# Patient Record
Sex: Male | Born: 1960
Health system: Southern US, Community
[De-identification: ages and names within clinical notes are randomized; demographics above are authoritative.]

## PROBLEM LIST (undated history)

## (undated) DIAGNOSIS — K449 Diaphragmatic hernia without obstruction or gangrene: Secondary | ICD-10-CM

## (undated) DIAGNOSIS — C419 Malignant neoplasm of bone and articular cartilage, unspecified: Secondary | ICD-10-CM

## (undated) DIAGNOSIS — K2970 Gastritis, unspecified, without bleeding: Secondary | ICD-10-CM

## (undated) DIAGNOSIS — R6889 Other general symptoms and signs: Secondary | ICD-10-CM

## (undated) DIAGNOSIS — C499 Malignant neoplasm of connective and soft tissue, unspecified: Secondary | ICD-10-CM

## (undated) DIAGNOSIS — F1210 Cannabis abuse, uncomplicated: Secondary | ICD-10-CM

## (undated) DIAGNOSIS — E785 Hyperlipidemia, unspecified: Secondary | ICD-10-CM

## (undated) DIAGNOSIS — R110 Nausea: Secondary | ICD-10-CM

## (undated) DIAGNOSIS — R1110 Vomiting, unspecified: Secondary | ICD-10-CM

## (undated) HISTORY — DX: Malignant neoplasm of connective and soft tissue, unspecified: C49.9

## (undated) HISTORY — DX: Nausea: R11.0

## (undated) HISTORY — PX: APPENDECTOMY: SHX54

## (undated) HISTORY — DX: Diaphragmatic hernia without obstruction or gangrene: K44.9

## (undated) HISTORY — PX: AMPUTATION: SHX166

## (undated) HISTORY — DX: Hyperlipidemia, unspecified: E78.5

## (undated) HISTORY — DX: Gastritis, unspecified, without bleeding: K29.70

## (undated) HISTORY — DX: Other general symptoms and signs: R68.89

## (undated) HISTORY — DX: Vomiting, unspecified: R11.10

## (undated) HISTORY — DX: Malignant neoplasm of bone and articular cartilage, unspecified: C41.9

## (undated) HISTORY — DX: Cannabis abuse, uncomplicated: F12.10

## (undated) HISTORY — PX: CERVICAL LAMINECTOMY: SHX94

---

## 1996-07-14 DIAGNOSIS — D229 Melanocytic nevi, unspecified: Secondary | ICD-10-CM

## 1996-07-14 HISTORY — DX: Melanocytic nevi, unspecified: D22.9

## 2000-06-13 ENCOUNTER — Emergency Department (HOSPITAL_COMMUNITY): Admission: EM | Admit: 2000-06-13 | Discharge: 2000-06-13 | Payer: Self-pay | Admitting: *Deleted

## 2001-04-20 ENCOUNTER — Encounter: Payer: Self-pay | Admitting: Emergency Medicine

## 2001-04-21 ENCOUNTER — Inpatient Hospital Stay (HOSPITAL_COMMUNITY): Admission: EM | Admit: 2001-04-21 | Discharge: 2001-04-21 | Payer: Self-pay | Admitting: Emergency Medicine

## 2002-05-22 ENCOUNTER — Encounter: Payer: Self-pay | Admitting: Emergency Medicine

## 2002-05-22 ENCOUNTER — Emergency Department (HOSPITAL_COMMUNITY): Admission: EM | Admit: 2002-05-22 | Discharge: 2002-05-22 | Payer: Self-pay | Admitting: Emergency Medicine

## 2002-08-08 ENCOUNTER — Emergency Department (HOSPITAL_COMMUNITY): Admission: EM | Admit: 2002-08-08 | Discharge: 2002-08-08 | Payer: Self-pay | Admitting: *Deleted

## 2002-08-08 ENCOUNTER — Encounter: Payer: Self-pay | Admitting: *Deleted

## 2003-04-15 ENCOUNTER — Inpatient Hospital Stay (HOSPITAL_COMMUNITY): Admission: EM | Admit: 2003-04-15 | Discharge: 2003-04-17 | Payer: Self-pay | Admitting: Emergency Medicine

## 2003-06-08 ENCOUNTER — Encounter: Admission: RE | Admit: 2003-06-08 | Discharge: 2003-06-08 | Payer: Self-pay | Admitting: Gastroenterology

## 2003-08-03 ENCOUNTER — Emergency Department (HOSPITAL_COMMUNITY): Admission: EM | Admit: 2003-08-03 | Discharge: 2003-08-03 | Payer: Self-pay | Admitting: Emergency Medicine

## 2003-08-09 ENCOUNTER — Emergency Department (HOSPITAL_COMMUNITY): Admission: EM | Admit: 2003-08-09 | Discharge: 2003-08-09 | Payer: Self-pay | Admitting: Emergency Medicine

## 2003-08-11 ENCOUNTER — Observation Stay (HOSPITAL_COMMUNITY): Admission: EM | Admit: 2003-08-11 | Discharge: 2003-08-13 | Payer: Self-pay | Admitting: Emergency Medicine

## 2003-08-19 ENCOUNTER — Inpatient Hospital Stay (HOSPITAL_COMMUNITY): Admission: EM | Admit: 2003-08-19 | Discharge: 2003-08-21 | Payer: Self-pay | Admitting: Emergency Medicine

## 2004-06-20 ENCOUNTER — Emergency Department (HOSPITAL_COMMUNITY): Admission: EM | Admit: 2004-06-20 | Discharge: 2004-06-20 | Payer: Self-pay | Admitting: Emergency Medicine

## 2004-06-21 ENCOUNTER — Emergency Department (HOSPITAL_COMMUNITY): Admission: EM | Admit: 2004-06-21 | Discharge: 2004-06-21 | Payer: Self-pay | Admitting: Emergency Medicine

## 2004-06-26 ENCOUNTER — Encounter: Admission: RE | Admit: 2004-06-26 | Discharge: 2004-06-26 | Payer: Self-pay | Admitting: Gastroenterology

## 2004-07-11 ENCOUNTER — Encounter: Admission: RE | Admit: 2004-07-11 | Discharge: 2004-07-11 | Payer: Self-pay | Admitting: Gastroenterology

## 2004-07-24 ENCOUNTER — Ambulatory Visit (HOSPITAL_COMMUNITY): Admission: RE | Admit: 2004-07-24 | Discharge: 2004-07-24 | Payer: Self-pay | Admitting: Gastroenterology

## 2004-10-15 ENCOUNTER — Emergency Department (HOSPITAL_COMMUNITY): Admission: EM | Admit: 2004-10-15 | Discharge: 2004-10-16 | Payer: Self-pay | Admitting: Emergency Medicine

## 2005-04-13 ENCOUNTER — Emergency Department (HOSPITAL_COMMUNITY): Admission: EM | Admit: 2005-04-13 | Discharge: 2005-04-14 | Payer: Self-pay | Admitting: Emergency Medicine

## 2005-08-29 ENCOUNTER — Inpatient Hospital Stay (HOSPITAL_COMMUNITY): Admission: EM | Admit: 2005-08-29 | Discharge: 2005-08-30 | Payer: Self-pay | Admitting: Emergency Medicine

## 2006-08-08 ENCOUNTER — Emergency Department (HOSPITAL_COMMUNITY): Admission: EM | Admit: 2006-08-08 | Discharge: 2006-08-08 | Payer: Self-pay | Admitting: Emergency Medicine

## 2007-12-27 ENCOUNTER — Encounter: Admission: RE | Admit: 2007-12-27 | Discharge: 2007-12-27 | Payer: Self-pay | Admitting: Neurosurgery

## 2008-06-06 ENCOUNTER — Emergency Department (HOSPITAL_COMMUNITY): Admission: EM | Admit: 2008-06-06 | Discharge: 2008-06-06 | Payer: Self-pay | Admitting: Emergency Medicine

## 2008-07-19 DIAGNOSIS — K2970 Gastritis, unspecified, without bleeding: Secondary | ICD-10-CM

## 2008-07-19 HISTORY — DX: Gastritis, unspecified, without bleeding: K29.70

## 2008-08-01 ENCOUNTER — Emergency Department (HOSPITAL_COMMUNITY): Admission: EM | Admit: 2008-08-01 | Discharge: 2008-08-01 | Payer: Self-pay | Admitting: Family Medicine

## 2008-08-20 ENCOUNTER — Emergency Department (HOSPITAL_COMMUNITY): Admission: EM | Admit: 2008-08-20 | Discharge: 2008-08-20 | Payer: Self-pay | Admitting: Emergency Medicine

## 2008-11-03 ENCOUNTER — Emergency Department (HOSPITAL_COMMUNITY): Admission: EM | Admit: 2008-11-03 | Discharge: 2008-11-03 | Payer: Self-pay | Admitting: Emergency Medicine

## 2008-11-05 ENCOUNTER — Observation Stay (HOSPITAL_COMMUNITY): Admission: EM | Admit: 2008-11-05 | Discharge: 2008-11-06 | Payer: Self-pay | Admitting: Emergency Medicine

## 2009-12-10 ENCOUNTER — Ambulatory Visit: Payer: Self-pay | Admitting: Cardiology

## 2010-07-17 ENCOUNTER — Other Ambulatory Visit: Payer: Self-pay | Admitting: *Deleted

## 2010-07-17 DIAGNOSIS — F419 Anxiety disorder, unspecified: Secondary | ICD-10-CM

## 2010-07-17 MED ORDER — DIAZEPAM 5 MG PO TABS: 5.0000 mg | ORAL_TABLET | Freq: Three times a day (TID) | ORAL | Status: AC

## 2010-07-17 NOTE — Telephone Encounter (Signed)
Refilled meds per fax request.  

## 2010-07-27 LAB — URINALYSIS, ROUTINE W REFLEX MICROSCOPIC
Bilirubin Urine: NEGATIVE
Nitrite: NEGATIVE
Specific Gravity, Urine: 1.038 — ABNORMAL HIGH (ref 1.005–1.030)
Urobilinogen, UA: 0.2 mg/dL (ref 0.0–1.0)
pH: 6 (ref 5.0–8.0)

## 2010-07-27 LAB — POCT I-STAT, CHEM 8
Calcium, Ion: 1.15 mmol/L (ref 1.12–1.32)
Creatinine, Ser: 1 mg/dL (ref 0.4–1.5)
Glucose, Bld: 133 mg/dL — ABNORMAL HIGH (ref 70–99)
Hemoglobin: 14.6 g/dL (ref 13.0–17.0)
Sodium: 144 mEq/L (ref 135–145)
TCO2: 22 mmol/L (ref 0–100)

## 2010-07-27 LAB — DIFFERENTIAL
Basophils Relative: 1 % (ref 0–1)
Eosinophils Absolute: 0 10*3/uL (ref 0.0–0.7)
Eosinophils Absolute: 0 10*3/uL (ref 0.0–0.7)
Eosinophils Relative: 0 % (ref 0–5)
Eosinophils Relative: 1 % (ref 0–5)
Lymphs Abs: 0.9 10*3/uL (ref 0.7–4.0)
Monocytes Absolute: 0.4 10*3/uL (ref 0.1–1.0)
Monocytes Relative: 4 % (ref 3–12)
Monocytes Relative: 5 % (ref 3–12)
Neutrophils Relative %: 83 % — ABNORMAL HIGH (ref 43–77)

## 2010-07-27 LAB — COMPREHENSIVE METABOLIC PANEL
ALT: 15 U/L (ref 0–53)
CO2: 25 mEq/L (ref 19–32)
Calcium: 9.4 mg/dL (ref 8.4–10.5)
Creatinine, Ser: 1.08 mg/dL (ref 0.4–1.5)
GFR calc Af Amer: >60 mL/min (ref >60)
GFR calc non Af Amer: >60 mL/min (ref >60)
Glucose, Bld: 185 mg/dL — ABNORMAL HIGH (ref 70–99)
Sodium: 146 mEq/L — ABNORMAL HIGH (ref 135–145)
Total Protein: 7.1 g/dL (ref 6.0–8.3)

## 2010-07-27 LAB — BASIC METABOLIC PANEL
CO2: 30 mEq/L (ref 19–32)
Chloride: 107 mEq/L (ref 96–112)
Glucose, Bld: 102 mg/dL — ABNORMAL HIGH (ref 70–99)
Potassium: 3.9 mEq/L (ref 3.5–5.1)
Sodium: 143 mEq/L (ref 135–145)

## 2010-07-27 LAB — CBC
HCT: 38.9 % — ABNORMAL LOW (ref 39.0–52.0)
HCT: 45.9 % (ref 39.0–52.0)
Hemoglobin: 13.2 g/dL (ref 13.0–17.0)
Hemoglobin: 14.5 g/dL (ref 13.0–17.0)
MCHC: 33.7 g/dL (ref 30.0–36.0)
MCHC: 33.9 g/dL (ref 30.0–36.0)
MCHC: 34.8 g/dL (ref 30.0–36.0)
MCV: 89.4 fL (ref 78.0–100.0)
Platelets: 225 10*3/uL (ref 150–400)
RBC: 4.28 MIL/uL (ref 4.22–5.81)
RBC: 4.7 MIL/uL (ref 4.22–5.81)
RDW: 13.3 % (ref 11.5–15.5)
WBC: 6.6 10*3/uL (ref 4.0–10.5)

## 2010-07-27 LAB — LIPASE, BLOOD: Lipase: 25 U/L (ref 11–59)

## 2010-07-29 LAB — URINALYSIS, ROUTINE W REFLEX MICROSCOPIC
Bilirubin Urine: NEGATIVE
Nitrite: NEGATIVE
Protein, ur: NEGATIVE mg/dL
Urobilinogen, UA: 1 mg/dL (ref 0.0–1.0)
pH: 5.5 (ref 5.0–8.0)

## 2010-07-29 LAB — BASIC METABOLIC PANEL
BUN: 28 mg/dL — ABNORMAL HIGH (ref 6–23)
CO2: 19 mEq/L (ref 19–32)
Calcium: 10.4 mg/dL (ref 8.4–10.5)
GFR calc non Af Amer: >60 mL/min (ref >60)
Glucose, Bld: 162 mg/dL — ABNORMAL HIGH (ref 70–99)
Potassium: 3.6 mEq/L (ref 3.5–5.1)

## 2010-07-29 LAB — RAPID URINE DRUG SCREEN, HOSP PERFORMED
Cocaine: NOT DETECTED
Tetrahydrocannabinol: POSITIVE — AB

## 2010-07-29 LAB — DIFFERENTIAL
Basophils Absolute: 0 10*3/uL (ref 0.0–0.1)
Basophils Relative: 0 % (ref 0–1)
Eosinophils Relative: 1 % (ref 0–5)
Lymphocytes Relative: 16 % (ref 12–46)
Monocytes Absolute: 0.5 10*3/uL (ref 0.1–1.0)

## 2010-07-29 LAB — CBC
HCT: 50.1 % (ref 39.0–52.0)
MCHC: 34.1 g/dL (ref 30.0–36.0)
Platelets: 242 10*3/uL (ref 150–400)
RDW: 12.8 % (ref 11.5–15.5)

## 2010-08-05 LAB — DIFFERENTIAL
Basophils Absolute: 0 10*3/uL (ref 0.0–0.1)
Basophils Relative: 0 % (ref 0–1)
Eosinophils Relative: 0 % (ref 0–5)
Lymphocytes Relative: 6 % — ABNORMAL LOW (ref 12–46)
Monocytes Absolute: 1.3 10*3/uL — ABNORMAL HIGH (ref 0.1–1.0)
Neutro Abs: 14.2 10*3/uL — ABNORMAL HIGH (ref 1.7–7.7)

## 2010-08-05 LAB — CBC
HCT: 48.3 % (ref 39.0–52.0)
Hemoglobin: 16.4 g/dL (ref 13.0–17.0)
MCV: 91.2 fL (ref 78.0–100.0)
RBC: 5.3 MIL/uL (ref 4.22–5.81)
WBC: 16.5 10*3/uL — ABNORMAL HIGH (ref 4.0–10.5)

## 2010-08-05 LAB — COMPREHENSIVE METABOLIC PANEL
Albumin: 5.2 g/dL (ref 3.5–5.2)
Alkaline Phosphatase: 77 U/L (ref 39–117)
BUN: 36 mg/dL — ABNORMAL HIGH (ref 6–23)
CO2: 21 mEq/L (ref 19–32)
Chloride: 104 mEq/L (ref 96–112)
Creatinine, Ser: 1.72 mg/dL — ABNORMAL HIGH (ref 0.4–1.5)
GFR calc non Af Amer: 43 mL/min — ABNORMAL LOW (ref >60)
Glucose, Bld: 133 mg/dL — ABNORMAL HIGH (ref 70–99)
Potassium: 4 mEq/L (ref 3.5–5.1)
Total Bilirubin: 1.4 mg/dL — ABNORMAL HIGH (ref 0.3–1.2)

## 2010-08-05 LAB — LIPASE, BLOOD: Lipase: 16 U/L (ref 11–59)

## 2010-09-02 NOTE — H&P (Signed)
Benjamin Valdez, Benjamin Valdez          ACCOUNT NO.:  [id number]   MEDICAL RECORD NO.:  [id number]          PATIENT TYPE:  INP   LOCATION:  1521                         FACILITY:  [facility]   PHYSICIAN:  Graylin Shiver, M.D.   DATE OF BIRTH:  [birth year]   DATE OF ADMISSION:  11/05/2008  DATE OF DISCHARGE:                              HISTORY & PHYSICAL   CHIEF COMPLAINT:  Vomiting.   HISTORY OF PRESENT ILLNESS:  The patient is a 50 year old white male who  presented to the emergency room with complaints of persistent vomiting.  This is his second time here in a couple of days to the emergency room.  He has had problems with chronic recurring vomiting for 10-12 years, and  despite numerous and extensive GI evaluation according to him nothing  has been found to explain the recurring vomiting.  He states that he has  had problems with vomiting this year at least seven or eight times.  When he gets these spells, they will last for 8-9 hours.  This time in  the emergency room he has continued to have problems with the vomiting  despite the symptomatic treatment and IV hydration, and it was felt by  the emergency room physician that he should be admitted.   The patient has had negative endoscopies, negative ultrasounds, negative  gastric emptying study in the past.  He is recently getting established  with GI at [facility], and states that he saw a PA with the GI  Department there a few weeks ago and it was felt that he may have  cyclical vomiting syndrome.  He was given a prescription for  amitriptyline, but he does not know what dose it was and he never  started taking it yet.  Of note is that in between these episodes of  vomiting he feels fine.   PAST HISTORY:  1. 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  This was followed by a      recurrence, and he had radiation therapy.  2. History of appendectomy.  His oncologist  at [facility] is Dr.      Edilia Bo.   ALLERGIES:  None known.   SOCIAL HISTORY:  He does not smoke or drink alcohol.   MEDICATIONS:  1. Crestor 10 mg daily.  2. Paxil 40 mg daily.  3. Protonix 40 mg daily.   SYSTEMS REVIEW:  No complaints of chest pain or shortness of breath.   PHYSICAL EXAMINATION:  VITAL SIGNS:  Stable.  He does not appear in any acute distress at this time, although he is  lying in bed and has a cloth over his eyes.  He is nonicteric.  NECK:  Supple.  HEART:  Regular rhythm.  No murmurs.  LUNGS:  Clear.  ABDOMEN:  Soft.  There is some mild diffuse tenderness over the  musculature which I suspect this is from his vomiting so much.   IMPRESSION:  Recurrent vomiting.   PLAN:  The patient is going to be admitted to the  hospital primarily for  intravenous hydration and supportive care.  We will provide antiemetics,  analgesics, and proton pump inhibitors.   His labs were reviewed and there is no significant abnormality in CBC or  electrolytes.  Two days ago when he was here in the emergency room, an  abdominal x-ray showed no evidence of bowel obstruction.           ______________________________  Graylin Shiver, M.D.     SFG/MEDQ  D:  11/05/2008  T:  11/06/2008  Job:  [id number]   cc:   Petra Kuba, M.D.  Fax: [phone]   Cassell Clement, M.D.  Fax: [phone]

## 2010-09-05 NOTE — H&P (Signed)
NAME:  Benjamin Valdez, Benjamin Valdez                      ACCOUNT NO.:  [id number]   MEDICAL RECORD NO.:  [id number]                   PATIENT TYPE:  EMS   LOCATION:  ED                                   FACILITY:  [facility]   PHYSICIAN:  Leonia Reeves, MD                 DATE OF BIRTH:  02/03/1961   DATE OF ADMISSION:  04/15/2003  DATE OF DISCHARGE:                                HISTORY & PHYSICAL   PRIMARY CARE PHYSICIAN:  Cassell Clement, M.D.   ADMITTING DIAGNOSES AND PLAN:  1. Abdominal pain, nausea, and vomiting, unclear etiology probably secondary     to local inflammatory process in the small bowel.     a. Intravenous Demerol as needed for abdominal pain, intravenous        Phenergan as needed for nausea and vomiting, nothing by mouth to rest        the bowel.  2. Clinical dehydration secondary to fluid loss from vomiting.     a. Intravenous rehydration with normal saline.     b. Monitor BMP.  3. Elevated blood pressure (no history of hypertension) probably secondary     to abdominal pain.     a. Monitor blood pressure.     b. Treat if criteria for hypertension are met.  4. Hyperglycemia (no history of diabetes mellitus).     a. Monitor blood glucose.     b. If continued elevation of blood glucose check hemoglobin A1C and treat        accordingly.  5. History of angiosarcoma left lower extremity status post below-knee     amputation and radiation therapy, stable.   CHIEF COMPLAINT:  Nausea, vomiting, and abdominal discomfort.   HISTORY OF PRESENT ILLNESS:  Benjamin Valdez is a 50 year old Caucasian male with  a history of left lower extremity angiosarcoma status post left below-knee  amputation and status post radiation therapy who presents with nausea,  vomiting, and abdominal discomfort of sudden onset.  Patient denied history  of hematemesis, hematochezia, fever, and chills.  He describes the abdominal  pain as generalized and associated with anorexia.  He denied any trauma to  the abdomen and cannot relate abdominal pain to any food that he has eaten.  In ER abdominal x-ray shows prominent small bowel loops at the right side,  questionable etiology, probably local inflammatory process.   PAST MEDICAL HISTORY:  Angiosarcoma of left lower extremity diagnosed in  1998, status post below-knee amputation.  Had radiation therapy in 1999.  Patient denied history of diabetes mellitus, hypertension, and  hypercholesterolemia.   FAMILY HISTORY:  1. Brother has inflammatory bowel disease.  2. Positive family history of cardiac disease.   SOCIAL HISTORY:  Denied alcohol, tobacco, or any recreational drug abuse.   ALLERGIES:  No known drug allergies.   CURRENT MEDICATIONS:  1. Paxil 30 mg once daily.  2. Augmentin 875 mg twice daily prescribed on April 06, 2003 and  to run     for 2 weeks, due to foot septic spot on the amputated area on foot.     Patient has been noncompliant to the antibiotic prescription.   REVIEW OF SYSTEMS:  CONSTITUTIONAL:  No fever.  CARDIOPULMONARY:  No cough,  no sputum production, no dyspnea, and no chest pain.  GIT:  Nausea,  vomiting, and abdominal discomfort.  No diarrhea.  EXTREMITIES:  No leg  edema.   PHYSICAL EXAMINATION:  GENERAL:  A 50 year old Caucasian male, well  built/well nourished in no acute distress.  VITAL SIGNS:  Blood pressure 169/99, pulse 89, respiratory rate 28,  temperature 98.7, oxygen saturation 98% on room air.  HEENT:  Head is normocephalic, atraumatic.  Pupils are equal, round,  reactive to light and accommodation.  Sclerae are anicteric.  Mucous  membranes are dry consistent with dehydration.  Nares are patent and there  is no evidence of oropharyngeal lesion.  NECK:  Supple without adenopathy.  CARDIOVASCULAR:  Normal S1 and S2, no S3 gallop, no rub, no murmur  appreciated.  RESPIRATORY:  Lungs are clear to auscultation and percussion.  Normal breath  sounds bilaterally.  GIT:  Abdomen is soft, he has  mild generalized tenderness, no palpable mass.  Bowel sounds are hypoactive.  EXTREMITIES:  Status post left below-knee amputation, no edema, no cyanosis.   LABORATORY DATA:  White count 8.2, hemoglobin 15.8, hematocrit 44.9,  platelets __________.  Sodium 142, potassium 3.5, chloride 108, CO2 24, BUN  23, creatinine 1.2, glucose 168, calcium 8.   ASSESSMENT AND PLAN:  A 50 year old Caucasian male with a history of left  lower extremity angiosarcoma status post left below-knee amputation who  presents with nausea, vomiting and generalized abdominal pain, abdominal x-  ray showed prominent small bowel loops, probably local inflammatory process.  Patient will be admitted to the medical floor and managed as stated above.  He will remain nothing by mouth and will be started on intravenous  rehydration.  He will also be started on intravenous Demerol for abdominal  pain and intravenous Phenergan for nausea and vomiting.  If the abdominal  discomfort and nausea and vomiting improves patient will be discharged to  follow up with his primary care physician on outpatient basis.  But, if the  problem gets worse, then a gastroenterology consult will be done for further  evaluation.                                               Leonia Reeves, MD    VO/MEDQ  D:  04/15/2003  T:  04/15/2003  Job:  [id number]

## 2010-09-05 NOTE — Discharge Summary (Signed)
[facility]  Patient:    Benjamin Valdez, Benjamin Valdez Visit Number: [id number] MRN: [id number]          Service Type: MED Location: 3W 0358 01 Attending Physician:  Rudean Hitt Dictated by:   Clovis Pu Patty Sermons, M.D. Admit Date:  04/20/2001 Discharge Date: 04/21/2001                             Discharge Summary  FINAL DIAGNOSES: 1. Acute gastroenteritis, resolved. 2. Status post below-knee amputation. 3. Abnormal electrocardiogram. 4. Past history of bone malignancy.  OPERATIONS PERFORMED:  None.  HISTORY:  This 50 year old gentleman presented to the emergency room on April 20, 2001, with a chief complaint of nausea and vomiting and chills. The nausea and vomiting were abrupt in onset.  He had been in his usual state of health prior to this.  He has had no known coronary disease.  He had an extensive work-up in the emergency room including a CT scan, abdominal series, and ultrasound, all of which were negative.  The patient has a history of having had to have a left BK amputation because of angiosarcoma of the bone. He was received radiation treatment more than six months ago.  The patient has no history of tobacco use or illicit drug use, and he drinks very little alcohol.  PHYSICAL EXAMINATION:  Essentially negative except for the left BKA.  The abdominal exam showed some tenderness in the mid epigastrium, and there were hypoactive bowel sounds with no rebound or guarding.  LABORATORY STUDIES:  EKG showing sinus arrhythmia.  There were some T wave inversion V1 through V4.  Chest x-ray showed no acute disease.  White count was normal.  Initial CK was 98, and his troponin I was 0.01.  Liver functions studies were normal.  HOSPITAL COURSE:  The patient was given IV fluids.  His pain was treated with IV Toradol and with Zofran for nausea.  The patient improved rapidly and by the afternoon of April 21, 2001, he was anxious to go home.   An electrocardiogram done STAT just prior to discharge showed partial improvement in the T wave.  CK-MBs had come back negative x 3, and his exam was now normal, and he was discharged home to be followed up in the office.  DISCHARGE MEDICATIONS: 1. He will remain on his same dose of Paxil 30 mg q.d. 2. Antacid or Pepcid a.c. as needed.  He is to resume normal activity and be on a regular diet.  He is to see Dr. Patty Sermons in one month for office visit and EKG.  He is concerned about his family history of coronary disease and is wondering about stress testing, and we can discuss that further at the time of his office visit. Dictated by:   Clovis Pu Patty Sermons, M.D. Attending Physician:  Rudean Hitt DD:  04/27/01 TD:  04/27/01 Job: [id number]

## 2010-09-05 NOTE — H&P (Signed)
[facility]  Patient:    Benjamin Valdez, Benjamin Valdez Visit Number: [id number] MRN: [id number]          Service Type: EMS Location: ED Attending Physician:  Benny Lennert Dictated by:   Meade Maw, M.D. Admit Date:  04/20/2001   CC:         Thomas A. Patty Sermons, M.D.   History and Physical  PRIMARY PHYSICIAN:  Dr. Maisie Fus A. Brackbill.  HISTORY:  Benjamin Valdez is a very pleasant 50 year old gentleman who presented to the ER at 1:40 p.m. on January 1st with chief complaint of nausea, vomiting and chills.  The nausea and vomiting were abrupt in onset.  He had been in his usual state of health prior to this onset, has been to the gym this week and has exercised for 45 minutes on the Stairmaster without undue shortness of breath or chest pain.  In the emergency room, he was treated with IV hydration and Phenergan; there was minimal relief obtained with Phenergan.  He was subsequently given Zofran with relief.  Extensive GI workup including CT scan, abdominal series and ultrasound were negative.  Coronary risk factors include male sex, cholesterol status is undefined and equivocal family history; mother had coronary artery disease diagnosed at age 4; father has hypertension.  PAST MEDICAL HISTORY:  Past medical history is significant for left BKA from angiosarcoma.  He has received radiation treatment.  He has had no radiation treatment in six months prior to this presentation.  He has not received any chemotherapy.  MEDICATIONS:  None.  ALLERGIES:  None.  PAST SURGICAL HISTORY: 1. Left BKA. 2. Appendectomy. 3. T&A. 4. Cervical laminectomy.  REVIEW OF SYSTEMS:  He has an active lifestyle, walks on a Stairmaster three to four times per week.  He has not noted any change of bowel and bladder habits.  Review of systems is otherwise negative.  SOCIAL HISTORY:  He has never married, lives alone, disabled from his angiosarcoma.  He has no history of tobacco use,  no illicit drug use, social drinker only.  FAMILY HISTORY:  Family history is as noted above, significant for coronary artery disease in his mother, hypertension in his father.  PHYSICAL EXAMINATION:  GENERAL:  Physical exam reveals a middle-aged male in no acute distress.  VITAL SIGNS:  Systolic blood pressure ranging 138-147/70-80, heart rate 60 to 70.  His T-max is 100.8.  HEENT:  Unremarkable.  Oral mucosa is moist.  NECK:  Good carotid upstrokes.  No carotid bruit.  Thyroid is not palpable.  PULMONARY:  Exam reveals breath sounds which are equal and clear to auscultation.  No use of accessory muscles.  CARDIOVASCULAR:  Exam reveals a normal S1, normal S2, regular rate and rhythm. No rubs, murmurs or gallops were noted.  ABDOMEN:  Soft.  There is tenderness in the midepigastric region.  There are hypoactive bowel sounds.  There is no rebound or guarding.  EXTREMITIES:  No peripheral edema.  Distal pulses are equal and palpable.  SKIN:  Warm and dry.  NEUROLOGIC:  Nonfocal.  Motor is 5/5 throughout.  LABORATORY DATA:  EGC reveals sinus arrhythmia.  His initial ECG at 1500 revealed T wave inversions in V1 through V4 but was a poor-quality tracing. Repeat EKG at 0037 revealed T wave inversion in V1 through V4.  Amylase and lipase were negative.  Ultrasound of the abdomen was negative.  Abdominal series is negative.  Chest x-ray reveals no acute disease.  CT scan reveals no acute disease.  His  white count is 6.3, hematocrit is 44, platelet count is 180,000.  His potassium is 3.8, creatinine is 1.3.  His initial CK is 98 with a troponin I of 0.01.  He has normal liver enzymes.  IMPRESSION:  History and exam are most consistent with viral gastroenteritis in view of his negative abdominal workup.  His electrocardiographic changes are consistent with ischemia with T wave inversion noted in the precordial leads, however, his troponin I and CK are negative despite  ongoing symptoms for more than 12 hours.  By history, there is no evidence of ischemia.  We will admit him for observation, hydration and symptomatic treatment, obtain serial cardiac enzymes, repeat his electrocardiogram in the morning.  Further workup pending Dr. Jenness Corner review and clinical findings. Dictated by: Meade Maw, M.D. Attending Physician:  Benny Lennert DD:  04/21/01 TD:  04/21/01 Job: [id number]

## 2010-09-05 NOTE — Discharge Summary (Signed)
NAME:  Benjamin Valdez, Benjamin Valdez                           ACCOUNT NO.:  [id number]   MEDICAL RECORD NO.:  [id number]                   PATIENT TYPE:  INP   LOCATION:                                       FACILITY:  [facility]   PHYSICIAN:  Petra Kuba, M.D.                 DATE OF BIRTH:  [birth year]   DATE OF ADMISSION:  08/11/2003  DATE OF DISCHARGE:  08/13/2003                                 DISCHARGE SUMMARY   HISTORY:  Episodic nausea and vomiting, abdominal pain, with multiple ER  visits.  One of a few admissions since the last year or two.  He seems to be  fine in between these episodes but after presenting to the ER twice and not  getting better, we presented to the ER.  Workup included normal labs, x-  rays.  He was admitted for pain control, IV fluids, and CT scan, which was  done and was nondiagnostic.  He was better overnight, and his diet was  slowly advanced.  He was deemed ready for discharge by my partner, Dr.  Randa Evens, on the 27th.   As an aside, the patient did have some marijuana and some opiates on a drug  screen in the ER.  We did extensively discuss that.  He vehemently denies  significant drug abuse and just occasional marijuana.  We did discuss the  possibility that these episodes were set off by either drug withdrawal or  either toxicity, but he continued to deny those, and he was discharged in  improved condition.   DISCHARGE DIAGNOSIS:  1. Abdominal pain, nausea and vomiting.  Improved.  Questionable etiology.  2. History of osteosarcoma, status post surgery and radiation.   DISCHARGE INSTRUCTIONS:  Include continue home medicines.  Prilosec over the  counter once a day.  Call if recurrence of symptoms, otherwise follow up  with me in 1-2 weeks.   DIET:  Slowly advanced to tolerate.   ACTIVITY:  Slowly advance to tolerate.   CONDITION ON DISCHARGE:  Improved.                                               Petra Kuba, M.D.    MEM/MEDQ  D:  08/17/2003  T:   08/17/2003  Job:  [id number]

## 2010-09-05 NOTE — H&P (Signed)
Benjamin Valdez, Benjamin Valdez                 ACCOUNT NO.:  [id number]   MEDICAL RECORD NO.:  [id number]          PATIENT TYPE:  INP   LOCATION:  0102                         FACILITY:  [facility]   PHYSICIAN:  Shirley Friar, MDDATE OF BIRTH:  March 01, 1961   DATE OF ADMISSION:  08/29/2005  DATE OF DISCHARGE:                                HISTORY & PHYSICAL   REQUESTING PHYSICIAN:  Doug Sou, M.D.   CHIEF COMPLAINT:  Vomiting, abdominal pain.   HISTORY OF PRESENT ILLNESS:  Benjamin Valdez is a patient well-known to our  service, who has had recurrent nausea and vomiting and abdominal pain of  unclear etiology.  He was in his usual state of health until this morning  when he had the acute onset of nausea, vomiting and abdominal pain,  describing over 10 times of vomiting with mainly dry heaves.  He is also  having diffuse abdominal pain and diarrhea that started this morning.  He  also describes chills without fever.  In the past he has had multiple CT  scans that have been unrevealing for the source of his recurrent abdominal  pain.  He had an upper endoscopy done in April 2006, which showed a small  hiatal hernia but otherwise was negative.  He has had a gastric emptying  study done in the past, which was normal in 2005.  He had a HIDA scan done,  which was unremarkable in March 2006.  During his evaluation in the ER he  had three views of the abdomen done, which showed a possible foreign body in  his pelvis described as a spring-like structure, a possible spring from a  ball-point pen, but otherwise his x-ray was unremarkable.  He is unsure of  how a spring could be in his intestines but denies any foreign body  ingestions.  Currently he is afebrile, his vital signs are stable, but he is  having recurrent dry heaving despite Zofran.  Dilaudid has helped his pain.  He also was given Reglan with minimal relief.   PAST MEDICAL HISTORY:  1.  Recurrent unexplained nausea, vomiting and abdominal  pain, question      cyclical vomiting syndrome.  2.  Osteosarcoma of the left leg requiring amputation.  3.  History of lipoma with recent removal from his right shoulder.   MEDICINES:  Paxil, Protonix.   FAMILY HISTORY:  Brother has Crohn disease, otherwise negative.   SOCIAL HISTORY:  History of marijuana use, denies alcohol or cigarettes.   ALLERGIES:  No known drug allergies.   REVIEW OF SYSTEMS:  Negative except as above.   PHYSICAL EXAMINATION:  VITAL SIGNS:  Temperature 97.4, pulse 63, blood  pressure 127/75, respirations 20, O2 saturation is 100% on room air.  GENERAL:  Moderate acute distress with dry heaving, alert.  HEENT:  Nonicteric sclerae.  CHEST:  Clear to auscultation bilaterally.  CARDIOVASCULAR:  Regular rhythm without murmurs.  ABDOMEN:  Mild guarding throughout with diffuse mild tenderness, soft,  positive bowel sounds, nondistended.  EXTREMITIES:  Right leg:  No edema.  Left leg:  Status post below-knee  amputation.  LABORATORY DATA:  White blood count 8.1, hemoglobin 16, hematocrit 47.5,  platelet count 224.  Sodium 140, potassium 4.2, chloride 106, CO2 21,  glucose 162, BUN 32, creatinine 1.2, total bilirubin 1.0, ALP 64, AST 116,  ALT 37.  Lipase 39.   IMPRESSION:  A 50 year old white male with recurrent nausea, vomiting and  abdominal pain of questionable etiology, being admitted for antiemetics and  IV fluids.   PLAN:  Unclear the significance of this possible foreign body in his pelvis,  but will follow with daily acute abdominal series.  Will put the patient on  n.p.o. status at this time with antiemetics as needed and pain medicines as  needed.  We will also check a urine drug screen.      Shirley Friar, MD  Electronically Signed     VCS/MEDQ  D:  08/29/2005  T:  08/29/2005  Job:  [id number]   cc:   Petra Kuba, M.D.  Fax: [phone]   Cassell Clement, M.D.  Fax: [phone]

## 2010-09-05 NOTE — Discharge Summary (Signed)
NAMESUN, WILENSKY                 ACCOUNT NO.:  [id number]   MEDICAL RECORD NO.:  [id number]          PATIENT TYPE:  INP   LOCATION:  1614                         FACILITY:  [facility]   PHYSICIAN:  Shirley Friar, MDDATE OF BIRTH:  [birth year]   DATE OF ADMISSION:  08/29/2005  DATE OF DISCHARGE:  08/30/2005                                 DISCHARGE SUMMARY   DISCHARGE DIAGNOSES:  1.  Nausea, vomiting, and abdominal pain.  2.  History of recurrent nausea and vomiting and abdominal pain of unknown      etiology.  3.  History of angiosarcoma of the left lower extremity, status post below      knee amputation and radiation therapy.  4.  History of lipoma with recent removal from right shoulder area.   CONSULTATIONS:  None.   COMPLICATIONS:  None.   PROCEDURE:  None.   HOSPITAL COURSE:  Mr. Eichhorst was admitted on Aug 29, 2005, secondary to  acute onset of nausea and vomiting and abdominal pain similar to his  previous episodes.  He had a benign appearing chest and abdomen except for a  possible metallic spring-like density in his pelvis that was seen, but he  denied any foreign body ingestions.  Repeat x-ray on Aug 30, 2005, showed no  evidence of this spring per my evaluation.   The patient was managed with bowel rest, IV fluids, antiemetics, and pain  medicines as needed.  His symptoms markedly improved with Compazine and  p.r.n. Phenergan and Zofran.  On the morning of Aug 30, 2005, he was without  any pain and had not had any nausea and vomiting since in the emergency room  yesterday.  At that time, I decided to advance his diet to full liquids and  advance from there to a regular diet and he tolerated his diet without any  further problems.  On the evening of Aug 30, 2005, since he was stable,  tolerating p.o. diet without any nausea, vomiting, or abdominal pain, I  decided he was stable for discharge home.  Mr. Busbee has had multiple  evaluations for this nausea, vomiting,  and abdominal pain in the past which  have been outlined on his previous studies and no source of his abdominal  pain or nausea and vomiting has been pinpointed from these imaging studies.  His labs on admission yesterday were unremarkable including normal CMET  except for mild elevation in AST and normal lipase.  Of note, he had a  lipoma taken out of his right shoulder area at the early part of last week  and this may be the source of his elevated AST.  His AST on Aug 30, 2005,  decreased from 116 to 78.  His urine drug screen was positive for marijuana,  but negative for other substances.   DISCHARGE MEDICATIONS:  1.  Phenergan suppositories as needed.  2.  Zofran sublingual as needed.  3.  Paxil 30 mg p.o. daily as previously directed.  4.  Protonix 40 mg p.o. daily. (These medicines include all his previous      home  medications).   DISPOSITION:  Discharge to home with regular follow-up with Dr. Patty Sermons in  the next 2-3 weeks.  He is instructed to call Dr. Yevonne Pax office on  Monday to arrange this.  The patient will also follow up with Dr. Ewing Schlein or  associates at [facility] in the next 1-2 months.  [facility] office will arrange  this follow-up.   CONDITION ON DISCHARGE:  Improved.      Shirley Friar, MD  Electronically Signed     VCS/MEDQ  D:  08/30/2005  T:  08/31/2005  Job:  [id number]   cc:   Cassell Clement, M.D.  Fax: [phone]   Petra Kuba, M.D.  Fax: [phone]

## 2010-09-05 NOTE — H&P (Signed)
NAME:  Benjamin Valdez, Benjamin Valdez                           ACCOUNT NO.:  [id number]   MEDICAL RECORD NO.:  [id number]                   PATIENT TYPE:  INP   LOCATION:  0102                                 FACILITY:  [facility]   PHYSICIAN:  Althea Grimmer. Luther Parody, M.D.            DATE OF BIRTH:  [birth year]   DATE OF ADMISSION:  08/19/2003  DATE OF DISCHARGE:                                HISTORY & PHYSICAL   Mr. [name] is a 50 year old male who for the last 3-4 years has had  intermittent episodes of abdominal pain, nausea, and extreme uncontrollable  vomiting.  He has been admitted to the hospital several times with this and  has had an extensive workup, which has been unrevealing.  He last had an  episode last week, which lasted a few days, and he was again in his usual  state of good health until this morning when he began having extreme nausea  with retching, which was nonproductive.  This is followed by diffuse  cramping abdominal pain.  He presented to the emergency room after trying  hydrocodone and Compazine suppositories at home without benefit.  He also  tried to take Reglan but could not keep it down.  He has been treated at  home by Dr. Ewing Schlein with Prevacid and Berline Chough, without benefit.   Evaluation in the past has consisted of CT scan of the abdomen last month  that was normal as was a CT scan in February, 2004 and in January, 2003.  Ultrasonogram in January, 2003 was normal.  Multiple abdominal x-rays had  been unrevealing and upper GI series in February of this year demonstrated a  small hiatal hernia but was otherwise normal.  No obstruction had ever been  demonstrated.  Patient denies hematemesis, melena, or intermittent diarrhea.  He says his bowel habits are quite regular when he is not nauseous.  He  knows of no foods that trigger it and no specific medications that seem to  stop it.   PAST MEDICAL HISTORY:  Pertinent for depression.  He also has a history of a  left below-knee  amputation for osteosarcoma approximately seven years ago.  He is followed at [facility] for this and did have a local recurrence  treated with radiotherapy.  To his knowledge, he has no active recurrence of  sarcoma at this time and says that he has seen the oncologist within the  last 3-4 months.   CURRENT MEDICATIONS:  1. Paxil 30 mg q.d.  2. Prilosec.  3. Marijuana.  It is noted that in April, his toxicology screen was positive     for opiates as well.   ALLERGIES:  None reported.   FAMILY HISTORY:  Notable in that his brother has Crohn's disease.   REVIEW OF SYSTEMS:  GENERAL:  No weight loss or night sweats.  ENDOCRINE:  No known diabetes or thyroid problems.  SKIN:  No rash or  pruritus.  No  icterus or change in vision.  ENT:  No aphthous ulcers or chronic sore  throat.  RESPIRATORY:  No shortness of breath, cough, or wheezing.  CARDIAC:  No chest pain, palpitations, or history of valvular heart disease.  GI:  As  above.  GU:  No dysuria or hematuria.   PHYSICAL EXAMINATION:  VITAL SIGNS:  Afebrile.  Blood pressure 156/82, pulse  94 and regular.  GENERAL:  He is in moderate distress, profusely sweating, and in pain.  SKIN:  Normal but very diaphoretic.  HEENT:  Eyes:  Anicteric.  Oropharynx unremarkable.  NECK:  Supple without thyromegaly.  There is no cervical or inguinal  adenopathy.  LUNGS:  Chest sounds clear.  HEART:  Heart sounds show a regular rate and rhythm without murmurs, rubs or  gallops.  ABDOMEN:  Soft.  There is mild guarding.  There are hypoactive bowel sounds.  There is diffuse mild tenderness.  RECTAL:  Not performed.  EXTREMITIES:  A left BKA amputation.   LABORATORY TESTS:  Hemoglobin 16.1, white blood count 7.6.  Potassium 3.7,  blood sugar 162, BUN 18.  Liver function tests and lipase are normal.   IMPRESSION:  A 50 year old male with a history of osteosarcoma of the left  leg, status post below-knee amputation and underlying depression, who  is  presented to the emergency room again with intractable vomiting and  abdominal pain of unclear etiology, despite reflect psychogenic or cyclic  vomiting.  He is in such distress that I do not believe he will be sent home  from the emergency room; thus, he will be admitted for observation.  At some  point, if this problem continues, he will have to be worked up for [employer].  Consideration could be given to discontinuing the Paxil and trying him on  amitriptyline, which may help with cyclic vomiting.  Please see the orders.  The patient will be discussed with Dr. Ewing Schlein.                                               Althea Grimmer. Luther Parody, M.D.    PJS/MEDQ  D:  08/19/2003  T:  08/19/2003  Job:  [id number]   cc:   Petra Kuba, M.D.  [address]  [place]  [place] [address]  Fax: [phone]   Bernette Redbird, M.D.  [address]  [place], [place] [address]  Fax: [phone]

## 2010-09-05 NOTE — Discharge Summary (Signed)
NAMEBUELL, PARCEL                           ACCOUNT NO.:  [id number]   MEDICAL RECORD NO.:  [id number]                   PATIENT TYPE:  INP   LOCATION:  0382                                 FACILITY:  [facility]   PHYSICIAN:  Bernette Redbird, M.D.                DATE OF BIRTH:  May 28, 1960   DATE OF ADMISSION:  08/19/2003  DATE OF DISCHARGE:  08/21/2003                                 DISCHARGE SUMMARY   DISCHARGE DIAGNOSES:  1. Nausea, vomiting, and abdominal pain.  2. History of recurrent nausea, vomiting, and abdominal pain of unknown     cause.  3. History of angiosarcoma of the left lower extremity, status post below-     knee amputation and radiation therapy for recurrence.   CONSULTATIONS:  None.   COMPLICATIONS:  None.   PROCEDURE:  Gastric emptying scan.   LABORATORY DATA:  Admission labs as well as repeat labs the day following  admission were within normal limits without significant abnormalities noted.   HOSPITAL COURSE:  [place] has a longstanding history of episodic nausea  and vomiting, occurring without any identifiable provocative factors. In  recent years, while this has been occurring (basically since the time of  radiation therapy for his angiosarcoma), it has occurred on a sporadic  basis, perhaps a couple of times per year. However, over the past several  months, it has been more frequent and in fact he has had two trips to the  emergency room prior to this admission, plus the current admission, all  within the past month or so, due to recurrent symptoms. The episodes are  fairly stereotyped and although they can occasionally be aborted by the  prompt use of a Phenergan suppository, they frequently progressed to full  blown forceful nausea and vomiting with associated mid abdominal pain  probably related to the retching. There is no prodrome of abdominal  distention or lower abdominal pain that would necessarily suggest a small  bowel obstruction and  previous CT scans have failed to disclose any, as has  also a small bowel series.   With that background, the patient was admitted because of recurrent symptoms  and his third episode in the past several weeks.  He was brought into a  regular floor bed and given symptomatic treatment with pain medication,  antiemetics, bowel rest, and IV fluids. As is typical for him, relief seems  to come from a combination of multiple antiemetics including both Zofran and  Phenergan.   As is also typical for the patient, he had a very rapid turn-around, such  that by the morning following admission, he was essentially asymptomatic.  His diet was advanced to full liquids and then to solid foods which was well  tolerated.   A 24-hour urine collection for porphyrins was obtained and the result is  pending as is the time of this dictation.   Prior to discharge,  the patient underwent a radionuclide gastric emptying  scan which was within normal limits (11% retention at two hours).   In view of the patient's resolution of symptoms, discharge home was felt to  be clinically appropriate.   DISPOSITION:  The patient is discharged home on his previous home  medications which include Paxil, Prilosec, and p.r.n. Phenergan for attacks.  A prescription for Phenergan suppositories 25 mg, #30, with no refills was  provided to use one q.6h. p.r.n. nausea and vomiting. He may resume a  regular diet at his discretion and resume normal activities, but is aware  that Phenergan may cause drowsiness if he needs to use, in case he is going  to be operating a motor vehicle. He is to call us in the event of recurrent  symptoms and will otherwise follow up with Dr. Ewing Schlein on Tuesday, May 17th at  2 p.m.   RESULTS PENDING AT TIME OF DISCHARGE:  Urine porphyrins.   RECOMMENDATIONS FOR POSSIBLE EVALUATION IN THE EVALUATION IN THE FUTURE:  Possibly laparoscopy to rule out radiation induced adhesions causing  intermittent  small bowel obstruction (doubt), updated endoscopic evaluation,  and/or gastric motility testing (electrogastrogram) for possible  tachygastria.   CONDITION ON DISCHARGE:  Improved.                                               Bernette Redbird, M.D.    RB/MEDQ  D:  08/21/2003  T:  08/21/2003  Job:  [id number]   cc:   Cassell Clement, M.D.  [address]  [place]  [place] [address]  Fax: [phone]

## 2010-09-05 NOTE — Discharge Summary (Signed)
NAME:  Benjamin Valdez, Benjamin Valdez                      ACCOUNT NO.:  [id number]   MEDICAL RECORD NO.:  [id number]                   PATIENT TYPE:  INP   LOCATION:  0366                                 FACILITY:  [facility]   PHYSICIAN:  Leonia Reeves, MD                 DATE OF BIRTH:  1960-11-18   DATE OF ADMISSION:  04/15/2003  DATE OF DISCHARGE:  04/17/2003                                 DISCHARGE SUMMARY   DISCHARGE DIAGNOSES:  1. Abdominal pain, nausea, and vomiting, unclear etiology, probably     secondary to local inflammatory process in the small bowel.  2. Probable mild inflammatory disease of small bowel.  3. Clinical dehydration.  4. Hyperglycemia, resolved.   DISCHARGE MEDICATIONS:  1. Flagyl 500 mg two times daily for seven days.  2. Tylenol 650 mg q.4-6h. p.r.n. pain.  3. Phenergan 25 mg t.i.d. p.r.n. nausea and vomiting.   SUBJECTIVE:  [name] is a 50 year old male with a history of left lower  extremity angiosarcoma, status post left below-knee amputation and status  post radiation therapy who came to the ED with a complaint of nausea,  vomiting, and abdominal discomfort of sudden etiology. The patient denied a  history of hematemesis, hematochezia, fever, and chills. Abdominal x-ray  shows prominent small bowel loops on the right side, questionable etiology,  probably local inflammatory process.   PHYSICAL EXAMINATION:  VITAL SIGNS: Blood pressure 169/99, pulse 89,  respirations 28, temperature 98.7, oxygen saturation 98% on room air.  GENERAL: The patient was alert and oriented times three.  ABDOMEN: Soft and mildly generalized tenderness. No palpable mass. Bowel  sounds were hypoactive.  The rest of the physical was unremarkable.   LABORATORY DATA:  White count 8.2, hemoglobin 15.8, hematocrit 44.9,  platelet count 237,000.  Sodium 142, potassium 3.5, chloride 108, CO2 24,  BUN 23, creatinine 1.2, glucose 168, calcium 8.   HOSPITAL COURSE:  The patient was  admitted to the medical floor and kept  NPO.  He was started on IV rehydration with normal saline plus potassium  chloride. He was also started on IV Demerol for abdominal pain and IV  Phenergan for nausea and vomiting.  Gastric prophylaxis was done with p.o.  Protonix. The patient was also started on IV Flagyl and continued on p.o.  Augmentin which was prescribed for him for a septic spot on the amputated  area. The patient responded to the above regimen; nausea, vomiting, and  abdominal discomfort resolved, and bowel sounds were normal. He was started  on a liquid diet and advanced to a regular diet as tolerated. The patient  tolerated oral intake without nausea, vomiting, or abdominal pain. No  further evaluation such as CT of the abdomen was thought necessary at this  time.   CONDITION ON DISCHARGE:  Stable. The patient was discharged in stable  condition.   He is instructed to follow up with his primary care physician in  seven to  fourteen days of discharge.                                               Leonia Reeves, MD    VO/MEDQ  D:  04/16/2003  T:  04/16/2003  Job:  [id number]

## 2010-09-05 NOTE — Op Note (Signed)
NAMEISSAAC, SHIPPER                 ACCOUNT NO.:  [id number]   MEDICAL RECORD NO.:  [id number]          PATIENT TYPE:  AMB   LOCATION:  ENDO                         FACILITY:  [facility]   PHYSICIAN:  Petra Kuba, M.D.    DATE OF BIRTH:  11/18/1960   DATE OF PROCEDURE:  07/24/2004  DATE OF DISCHARGE:                                 OPERATIVE REPORT   PROCEDURE:  Esophagogastroduodenoscopy.   INDICATIONS:  Nausea, vomiting.  Nondiagnostic workup to date.   Consent was signed after risks, benefits, methods, options thoroughly  discussed in the office.   MEDICINES USED:  Demerol 100, Versed 12.   PROCEDURE:  The video endoscope was inserted by direct vision.  The  esophagus was normal.  He did have a small hiatal hernia.  The scope was  passed into the stomach, advanced through a normal antrum, normal pylorus,  into a normal duodenal bulb, and around the C loop to a normal second  portion of the duodenum.  The scope was slowly withdrawn back to the bulb.  Again, no abnormality was seen.  The scope was withdrawn back to the stomach  and retroflexed.  High in the cardia, the hiatal hernia was confirmed.  The  angularis, lesser and greater curve, and fundus were normal on retroflexed  visualization.  Straight visualization of the stomach did not reveal any  additional findings.  The air was suctioned.  The scope was then slowly  withdrawn, again, a good look at the esophagus was normal.  The scope was  removed.  The patient tolerated the procedure well.  There was no obvious  immediate complication.   ENDOSCOPIC DIAGNOSES:  1.  Small hiatal hernia.  2.  Otherwise within-normal-limits esophagogastroduodenoscopy.   PLAN:  No further workup at this time.  See how he does clinically.  Possibly, he just gets GI bugs because of his hiatal hernia, he cannot  control his nausea and vomiting, and tends to exacerbate the problem.      MEM/MEDQ  D:  07/24/2004  T:  07/24/2004  Job:  [id number]   cc:   Cassell Clement, M.D.  [address]  [place]  [place] [address]  Fax: [phone]

## 2010-09-05 NOTE — H&P (Signed)
NAME:  Benjamin Valdez, Benjamin Valdez                           ACCOUNT NO.:  [id number]   MEDICAL RECORD NO.:  [id number]                   PATIENT TYPE:  EMS   LOCATION:  ED                                   [facility]ACILITY[facility]  [facility]   PHYSICIAN:  Petra Kuba, M.D.                 DATE OF BIRTH:  11/19/1960   DATE OF ADMISSION:  08/11/2003  DATE OF DISCHARGE:                                HISTORY & PHYSICAL   HISTORY OF PRESENT ILLNESS:  The patient is known to me from an office  consult in February for periods of recurrent nausea, vomiting, and abdominal  pain.  In between these spells, he is essentially normal.  He might have  some mild nausea.  He did have a bout last fall, and had been fine until  last week, when he presented to the ER, got better with IV fluids, and I did  not hear about that.  He supposedly had a few good days with only minimal  nausea, but then his symptoms recurred.  He had nausea the other day, and  yesterday called my office to let me know about the above, and we agreed to  see him in the ER if it continued.  We did try as an outpatient sublingual  Prevacid and NuLev without success.  He has not had any sick contacts, and  has not traveled anywhere.  He is on no new medicines, and has no lower  bowel symptoms.  He says these are similar to all of his previous episodes.  Work-up in the past has increased an ultrasound a year or two ago, as well  as a CT at that point, and a negative upper GI small bowel follow through,  except for hiatal hernia 2 months ago.   PAST MEDICAL HISTORY:  1  Pertinent for some depression.  1. Osteosarcoma of the left leg.   FAMILY HISTORY:  Pertinent for a brother with Crohn's disease.   SOCIAL HISTORY:  Does not smoke.  Rarely drinks.  Smokes marijuana.  Denies  any stronger drugs.   CURRENT MEDICINES AT HOME:  1. Paxil.  2. Occasional Vicodin for the pain, which he says does not help.   ALLERGIES:  None.   REVIEW OF SYSTEMS:  Negative,  except above.   PHYSICAL EXAMINATION:  GENERAL:  In no acute distress, lying comfortably in  the bed.  VITAL SIGNS:  Stable, although initially his blood pressure was up.  His  temperature was 100.3.   LABORATORY DATA:  Pertinent for a normal CBC, including a white count of  6.3.  Neutrophil count is 82%, slightly elevated.  Chemistries are normal,  except for a BUN of 25, with normal liver tests and amylase, with the  exception of a mildly elevated white count on his 2 ER visits in the last  week.  No other abnormal labs were noted, except for  a positive toxicology  screen, opiates, and marijuana, which he and I discussed.  X-ray is  pertinent for being negative.   ASSESSMENT:  1. Recurring nausea, vomiting, abdominal pain, of questionable etiology.  2. History of leg sarcoma.  3. Family history of Crohn's.   PLAN:  IV fluids and ice chips only.  Demerol, Phenergan and Protonix.  Can  go ahead and proceed with a CT scan with further work-up and plans pending  those findings.  I will initially admit him for 23-hour observation,  depending on how he is doing tomorrow and this CT scan, to decide about  regular prolonged admission.                                               Petra Kuba, M.D.    MEM/MEDQ  D:  08/11/2003  T:  08/11/2003  Job:  [id number]

## 2010-09-05 NOTE — Discharge Summary (Signed)
NAMECHINONSO, Benjamin Valdez          ACCOUNT NO.:  [id number]   MEDICAL RECORD NO.:  [id number]          PATIENT TYPE:  OBV   LOCATION:  1521                         FACILITY:  [facility]   PHYSICIAN:  James L. Malon Kindle., M.D.DATE OF BIRTH:  Mar 18, 1961   DATE OF ADMISSION:  11/05/2008  DATE OF DISCHARGE:  11/06/2008                               DISCHARGE SUMMARY   HOSPITAL COURSE:  A 50 year old white male with extensive previous  workup for recurrent nausea and vomiting.  He is to be evaluated at  [facility] for possible cyclic vomiting syndrome.  Work up here has  been negative.  The vomiting usually starts suddenly.  He vomits and  retches and can become dehydrated.  The day of his admission, he had  another episode, came in vomiting, was unable to keep down water or ice  chips.  Was admitted for IV fluids.  He did receive antiemetic therapy  with Zofran.  By the next morning, he was doing well and was having no  further vomiting.  He had a regular breakfast without symptoms and was  felt to be in satisfactory condition for discharge.   DISPOSITION:  The patient is discharged home on all of his chronic  medications.  He will follow up with [facility] as planned and follow-  up here in town with Dr. Ewing Schlein.           ______________________________  Llana Aliment Malon Kindle., M.D.     Waldron Session  D:  11/13/2008  T:  11/13/2008  Job:  [id number]   cc:   Cassell Clement, M.D.  Fax: [phone]   Petra Kuba, M.D.  Fax: [phone]

## 2010-11-17 ENCOUNTER — Emergency Department (HOSPITAL_COMMUNITY)
Admission: EM | Admit: 2010-11-17 | Discharge: 2010-11-17 | Disposition: A | Discharge status: Home / Self Care | Payer: Medicare Other | Attending: Emergency Medicine | Admitting: Emergency Medicine

## 2010-11-17 DIAGNOSIS — Z046 Encounter for general psychiatric examination, requested by authority: Secondary | ICD-10-CM | POA: Insufficient documentation

## 2010-11-17 DIAGNOSIS — S88919A Complete traumatic amputation of unspecified lower leg, level unspecified, initial encounter: Secondary | ICD-10-CM | POA: Insufficient documentation

## 2010-11-17 LAB — CBC
MCHC: 33.7 g/dL (ref 30.0–36.0)
RDW: 12.8 % (ref 11.5–15.5)
WBC: 5.5 10*3/uL (ref 4.0–10.5)

## 2010-11-17 LAB — ACETAMINOPHEN LEVEL: Acetaminophen (Tylenol), Serum: <15 ug/mL (ref 10–30)

## 2010-11-17 LAB — COMPREHENSIVE METABOLIC PANEL
ALT: 18 U/L (ref 0–53)
AST: 24 U/L (ref 0–37)
Albumin: 4.1 g/dL (ref 3.5–5.2)
Alkaline Phosphatase: 76 U/L (ref 39–117)
Calcium: 10.1 mg/dL (ref 8.4–10.5)
Potassium: 4.1 mEq/L (ref 3.5–5.1)
Sodium: 138 mEq/L (ref 135–145)
Total Protein: 7.7 g/dL (ref 6.0–8.3)

## 2010-11-17 LAB — DIFFERENTIAL
Basophils Absolute: 0 10*3/uL (ref 0.0–0.1)
Basophils Relative: 1 % (ref 0–1)
Eosinophils Relative: 4 % (ref 0–5)
Lymphocytes Relative: 29 % (ref 12–46)
Monocytes Absolute: 0.5 10*3/uL (ref 0.1–1.0)
Neutro Abs: 3.2 10*3/uL (ref 1.7–7.7)

## 2010-11-17 LAB — RAPID URINE DRUG SCREEN, HOSP PERFORMED
Amphetamines: NOT DETECTED
Benzodiazepines: POSITIVE — AB
Cocaine: NOT DETECTED
Opiates: NOT DETECTED
Tetrahydrocannabinol: POSITIVE — AB

## 2010-11-24 ENCOUNTER — Ambulatory Visit (HOSPITAL_COMMUNITY)
Admission: RE | Admit: 2010-11-24 | Discharge: 2010-11-24 | Disposition: A | Discharge status: Home / Self Care | Payer: Medicare Other | Attending: Psychiatry | Admitting: Psychiatry

## 2010-11-24 DIAGNOSIS — F39 Unspecified mood [affective] disorder: Secondary | ICD-10-CM | POA: Insufficient documentation

## 2011-01-23 ENCOUNTER — Ambulatory Visit (INDEPENDENT_AMBULATORY_CARE_PROVIDER_SITE_OTHER): Payer: Medicare Other | Admitting: *Deleted

## 2011-01-23 ENCOUNTER — Other Ambulatory Visit: Payer: Self-pay | Admitting: *Deleted

## 2011-01-23 DIAGNOSIS — Z8349 Family history of other endocrine, nutritional and metabolic diseases: Secondary | ICD-10-CM

## 2011-01-23 DIAGNOSIS — E7800 Pure hypercholesterolemia, unspecified: Secondary | ICD-10-CM

## 2011-01-23 LAB — HEPATIC FUNCTION PANEL
ALT: 23 U/L (ref 0–53)
AST: 20 U/L (ref 0–37)
Alkaline Phosphatase: 58 U/L (ref 39–117)
Bilirubin, Direct: 0 mg/dL (ref 0.0–0.3)
Total Bilirubin: 0.4 mg/dL (ref 0.3–1.2)

## 2011-01-23 LAB — LIPID PANEL
LDL Cholesterol: 87 mg/dL (ref 0–99)
Total CHOL/HDL Ratio: 3
Triglycerides: 55 mg/dL (ref 0.0–149.0)

## 2011-01-23 LAB — BASIC METABOLIC PANEL
Chloride: 106 mEq/L (ref 96–112)
GFR: 63.9 mL/min (ref >60.00)
Potassium: 4.5 mEq/L (ref 3.5–5.1)
Sodium: 141 mEq/L (ref 135–145)

## 2011-01-28 ENCOUNTER — Ambulatory Visit (INDEPENDENT_AMBULATORY_CARE_PROVIDER_SITE_OTHER): Payer: Medicare Other | Admitting: Cardiology

## 2011-01-28 ENCOUNTER — Encounter: Payer: Self-pay | Admitting: Cardiology

## 2011-01-28 VITALS — BP 114/72 | HR 75 | Ht 72.0 in | Wt 165.0 lb

## 2011-01-28 DIAGNOSIS — E7800 Pure hypercholesterolemia, unspecified: Secondary | ICD-10-CM

## 2011-01-28 DIAGNOSIS — C419 Malignant neoplasm of bone and articular cartilage, unspecified: Secondary | ICD-10-CM

## 2011-01-28 NOTE — Progress Notes (Signed)
Benjamin Valdez Date of Birth:  28-Apr-1960 [facility] / [facility] [address]   [address] [place], [place]  [address] [phone]           Fax   [phone]  HPI: This pleasant 50 year old gentleman is seen for a one-year followup office visit.  He has a history of hypercholesterolemia and a strong family history of coronary disease.  He, himself does not have any known ischemic heart disease.  He had a normal nuclear stress test in 2009.  Current Outpatient Prescriptions  Medication Sig Dispense Refill  . amitriptyline (ELAVIL) 50 MG tablet Take 50 mg by mouth daily.        . diazepam (VALIUM) 5 MG tablet Take 5 mg by mouth every 6 (six) hours as needed.        [name] Kitchen omeprazole (PRILOSEC) 40 MG capsule Take 40 mg by mouth daily.        [name] Kitchen PARoxetine HCl (PAXIL PO) Take by mouth daily.        . rosuvastatin (CRESTOR) 20 MG tablet Take 20 mg by mouth daily.          No Known Allergies  There is no problem list on file for this patient.   History  Smoking status  . Never Smoker   Smokeless tobacco  . Not on file    History  Alcohol Use No    No family history on file.  Review of Systems: The patient denies any heat or cold intolerance.  No weight gain or weight loss.  The patient denies headaches or blurry vision.  There is no cough or sputum production.  The patient denies dizziness.  There is no hematuria or hematochezia.  The patient denies any muscle aches or arthritis.  The patient denies any rash.  The patient denies frequent falling or instability.  There is no history of depression or anxiety.  All other systems were reviewed and are negative.   Physical Exam: Filed Vitals:   01/28/11 1424  BP: 114/72  Pulse: 75   General appearance reveals a well-developed, well-nourished, gentleman in no distress.  The head and neck exam reveals pupils equal and reactive.  Extraocular movements are full.  There is no scleral icterus.  The mouth and pharynx  are normal.  The neck is supple.  The carotids reveal no bruits.  The jugular venous pressure is normal.  The  thyroid is not enlarged.  There is no lymphadenopathy.  The chest is clear to percussion and auscultation.  There are no rales or rhonchi.  Expansion of the chest is symmetrical.  The precordium is quiet.  The first heart sound is normal.  The second heart sound is physiologically split.  There is no murmur gallop rub or click.  There is no abnormal lift or heave.  The abdomen is soft and nontender.  The bowel sounds are normal.  The liver and spleen are not enlarged.  There are no abdominal masses.  There are no abdominal bruits.  Extremities reveal a left BK amputation.  There is no phlebitis or edema.  There is no cyanosis or clubbing.  Strength is normal and symmetrical in all extremities.  There is no lateralizing weakness.  There are no sensory deficits.  The skin is warm and dry.  There is no rash.  EKG shows normal sinus rhythm and is within normal limits.  Reviewed by me   Assessment / Plan: Continue same medication.  Recheck in one year

## 2011-01-28 NOTE — Assessment & Plan Note (Signed)
The patient has a history of significant hyperlipidemia.  He is on Crestor 20 mg daily.  He also has lost 21 pounds since last visit through dietary measures.  His lipids.  This time are quite acceptable.  He is not having any side effects from the Crestor.

## 2011-01-28 NOTE — Progress Notes (Signed)
Copy given at office visit

## 2011-01-28 NOTE — Patient Instructions (Signed)
continue same dose of medications  Your physician wants you to follow-up in: 1 year You will receive a reminder letter in the mail two months in advance. If you don't receive a letter, please call our office to schedule the follow-up appointment.

## 2011-01-28 NOTE — Assessment & Plan Note (Signed)
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  He has a left BK amputation.  He is followed closely at [facility].  There has been no evidence of recurrence.  Recently, the patient had some left shoulder pain and he was concerned about the specialists at [facility] determined that this was some early inflammation of his rotator cuff and there was no evidence of recurrent cancer of the bone.  Patient does exercise on a regular basis by swimming

## 2011-01-29 ENCOUNTER — Telehealth: Payer: Self-pay | Admitting: Cardiology

## 2011-01-29 ENCOUNTER — Encounter: Payer: Self-pay | Admitting: Cardiology

## 2011-01-29 NOTE — Telephone Encounter (Signed)
Pt saw that on his sheet it said he was 67feet and he is 73feet and it changes his BMI so he wanted [place] to know and make the cahnge

## 2011-01-29 NOTE — Telephone Encounter (Signed)
Will do addendum to office visit

## 2011-02-05 ENCOUNTER — Other Ambulatory Visit: Payer: Self-pay | Admitting: Cardiology

## 2011-02-05 MED ORDER — OMEPRAZOLE 40 MG PO CPDR: 40.0000 mg | DELAYED_RELEASE_CAPSULE | Freq: Every day | ORAL | Status: DC

## 2011-03-03 ENCOUNTER — Other Ambulatory Visit: Payer: Self-pay | Admitting: Cardiology

## 2011-03-03 NOTE — Telephone Encounter (Signed)
Fax Received. Refill Completed. Benjamin Valdez (M.A)  

## 2011-05-20 ENCOUNTER — Other Ambulatory Visit: Payer: Self-pay | Admitting: *Deleted

## 2011-05-20 DIAGNOSIS — F419 Anxiety disorder, unspecified: Secondary | ICD-10-CM

## 2011-05-20 MED ORDER — DIAZEPAM 5 MG PO TABS: 5.0000 mg | ORAL_TABLET | Freq: Four times a day (QID) | ORAL | Status: DC | PRN

## 2011-05-20 NOTE — Telephone Encounter (Signed)
Refill on valium 

## 2011-06-29 ENCOUNTER — Encounter (HOSPITAL_COMMUNITY): Payer: Self-pay | Admitting: *Deleted

## 2011-06-29 ENCOUNTER — Emergency Department (HOSPITAL_COMMUNITY)
Admission: EM | Admit: 2011-06-29 | Discharge: 2011-06-29 | Disposition: A | Discharge status: Home / Self Care | Payer: Medicare Other | Attending: Emergency Medicine | Admitting: Emergency Medicine

## 2011-06-29 DIAGNOSIS — Z85830 Personal history of malignant neoplasm of bone: Secondary | ICD-10-CM | POA: Insufficient documentation

## 2011-06-29 DIAGNOSIS — I10 Essential (primary) hypertension: Secondary | ICD-10-CM | POA: Insufficient documentation

## 2011-06-29 DIAGNOSIS — R1115 Cyclical vomiting syndrome unrelated to migraine: Secondary | ICD-10-CM | POA: Insufficient documentation

## 2011-06-29 DIAGNOSIS — S88119A Complete traumatic amputation at level between knee and ankle, unspecified lower leg, initial encounter: Secondary | ICD-10-CM | POA: Insufficient documentation

## 2011-06-29 DIAGNOSIS — E785 Hyperlipidemia, unspecified: Secondary | ICD-10-CM | POA: Insufficient documentation

## 2011-06-29 LAB — CBC
HCT: 40 % (ref 39.0–52.0)
Platelets: 181 10*3/uL (ref 150–400)
RDW: 12.9 % (ref 11.5–15.5)
WBC: 12.5 10*3/uL — ABNORMAL HIGH (ref 4.0–10.5)

## 2011-06-29 LAB — DIFFERENTIAL
Basophils Absolute: 0 10*3/uL (ref 0.0–0.1)
Lymphocytes Relative: 2 % — ABNORMAL LOW (ref 12–46)
Neutro Abs: 11.6 10*3/uL — ABNORMAL HIGH (ref 1.7–7.7)
Neutrophils Relative %: 93 % — ABNORMAL HIGH (ref 43–77)

## 2011-06-29 LAB — URINALYSIS, ROUTINE W REFLEX MICROSCOPIC
Glucose, UA: NEGATIVE mg/dL
Hgb urine dipstick: NEGATIVE
Ketones, ur: 15 mg/dL — AB
Protein, ur: NEGATIVE mg/dL

## 2011-06-29 LAB — COMPREHENSIVE METABOLIC PANEL
ALT: 19 U/L (ref 0–53)
AST: 20 U/L (ref 0–37)
CO2: 24 mEq/L (ref 19–32)
Chloride: 105 mEq/L (ref 96–112)
GFR calc non Af Amer: 66 mL/min — ABNORMAL LOW (ref >90)
Potassium: 3.7 mEq/L (ref 3.5–5.1)
Sodium: 142 mEq/L (ref 135–145)
Total Bilirubin: 0.3 mg/dL (ref 0.3–1.2)

## 2011-06-29 MED ORDER — HYDROMORPHONE HCL PF 1 MG/ML IJ SOLN
1.0000 mg | Freq: Once | INTRAMUSCULAR | Status: AC
  Administered 2011-06-29: 1 mg via INTRAVENOUS
  Filled 2011-06-29: qty 1

## 2011-06-29 MED ORDER — PROMETHAZINE HCL 25 MG RE SUPP: 25.0000 mg | Freq: Four times a day (QID) | RECTAL | Status: DC | PRN

## 2011-06-29 MED ORDER — PROMETHAZINE HCL 25 MG/ML IJ SOLN
25.0000 mg | Freq: Once | INTRAMUSCULAR | Status: AC
  Administered 2011-06-29: 25 mg via INTRAVENOUS
  Filled 2011-06-29: qty 1

## 2011-06-29 MED ORDER — DIPHENHYDRAMINE HCL 50 MG/ML IJ SOLN
25.0000 mg | Freq: Once | INTRAMUSCULAR | Status: AC
  Administered 2011-06-29: 25 mg via INTRAVENOUS
  Filled 2011-06-29: qty 1

## 2011-06-29 MED ORDER — LORAZEPAM 2 MG/ML IJ SOLN
1.0000 mg | Freq: Once | INTRAMUSCULAR | Status: AC
  Administered 2011-06-29: 1 mg via INTRAVENOUS
  Filled 2011-06-29: qty 1

## 2011-06-29 MED ORDER — PANTOPRAZOLE SODIUM 40 MG IV SOLR
40.0000 mg | Freq: Once | INTRAVENOUS | Status: AC
  Administered 2011-06-29: 40 mg via INTRAVENOUS
  Filled 2011-06-29: qty 40

## 2011-06-29 MED ORDER — SODIUM CHLORIDE 0.9 % IV BOLUS (SEPSIS)
1000.0000 mL | Freq: Once | INTRAVENOUS | Status: AC
  Administered 2011-06-29: 1000 mL via INTRAVENOUS

## 2011-06-29 MED ORDER — SODIUM CHLORIDE 0.9 % IV SOLN
INTRAVENOUS | Status: DC
  Administered 2011-06-29: 08:00:00 via INTRAVENOUS

## 2011-06-29 NOTE — ED Notes (Signed)
Pt stated that he could not urinate.    

## 2011-06-29 NOTE — ED Provider Notes (Addendum)
History     CSN: [id number]  Arrival date & time 06/29/11  4098   First MD Initiated Contact with Patient 06/29/11 [phone]      Chief Complaint  Patient presents with  . Emesis    x 8 hrs    (Consider location/radiation/quality/duration/timing/severity/associated sxs/prior treatment) HPI Comments: The patient is a 51 year old male who reports a history of cyclical vomiting syndrome without any exacerbations in years, who reports approximately 10 hours ago the onset of recurrent nausea and vomiting without diarrhea. He reports a normal bowel movement within the last 24 hours. She complains of generalized aching and cramping abdominal discomfort, moderate in severity, nonradiating, nonlocalizing. He denies any hematemesis. He denies any other symptoms.  Patient is a 51 y.o. male presenting with vomiting. The history is provided by the patient and medical records.  Emesis  This is a recurrent problem. The current episode started 6 to 12 hours ago. The problem occurs 5 to 10 times per day. The problem has not changed since onset.The emesis has an appearance of stomach contents. There has been no fever. Associated symptoms include abdominal pain. Pertinent negatives include no arthralgias, no chills, no cough, no diarrhea, no fever, no headaches, no myalgias, no sweats and no URI.    Past Medical History  Diagnosis Date  . Bone cancer     has left BK amputation  . Neck problem   . Nausea alone     Recurrent  . Angiosarcoma     Of the left leg  . Vomiting   . Chest pain April 2010    Myocardial Infarction ruled out  . Gastritis April 2010    Acute--Resolved  . Essential hypertension   . Dyslipidemia   . Hiatal hernia   . Marijuana abuse     History of Marijuana use    Past Surgical History  Procedure Date  . Appendectomy   . Amputation     Left leg below the knee   . Cervical laminectomy     History reviewed. No pertinent family history.  History  Substance Use Topics    . Smoking status: Never Smoker   . Smokeless tobacco: Not on file  . Alcohol Use: No      Review of Systems  Constitutional: Negative for fever, chills, diaphoresis and fatigue.  HENT: Negative.   Eyes: Negative.   Respiratory: Negative for cough, chest tightness, shortness of breath and wheezing.   Cardiovascular: Negative for chest pain and leg swelling.  Gastrointestinal: Positive for nausea, vomiting and abdominal pain. Negative for diarrhea, constipation, blood in stool, abdominal distention, anal bleeding and rectal pain.  Genitourinary: Negative for dysuria, urgency, frequency, hematuria, flank pain, decreased urine volume and difficulty urinating.  Musculoskeletal: Negative for myalgias, back pain and arthralgias.  Skin: Negative.   Neurological: Negative for dizziness, light-headedness, numbness and headaches.  Psychiatric/Behavioral: Negative.     Allergies  Review of patient's allergies indicates no known allergies.  Home Medications   Current Outpatient Rx  Name Route Sig Dispense Refill  . AMITRIPTYLINE HCL 50 MG PO TABS Oral Take 50 mg by mouth daily.      . CRESTOR 20 MG PO TABS  TAKE ONE TABLET BY MOUTH ONE TIME DAILY 30 each 5  . DIAZEPAM 5 MG PO TABS Oral Take 1 tablet (5 mg total) by mouth every 6 (six) hours as needed. 30 tablet 5  . OMEPRAZOLE 40 MG PO CPDR Oral Take 1 capsule (40 mg total) by mouth daily. 30 capsule  11  . PAXIL PO Oral Take by mouth daily.        BP 115/93  Pulse 104  Resp 20  SpO2 100%  Physical Exam  Nursing note and vitals reviewed. Constitutional: He is oriented to person, place, and time. He appears well-developed and well-nourished. He is active.  Non-toxic appearance. He does not have a sickly appearance. He does not appear ill. He appears distressed.  HENT:  Head: Normocephalic and atraumatic.  Right Ear: Hearing, tympanic membrane, external ear and ear canal normal.  Left Ear: Hearing, tympanic membrane, external ear and  ear canal normal.  Nose: Nose normal. No mucosal edema.  Mouth/Throat: Uvula is midline and oropharynx is clear and moist. Mucous membranes are dry. No oral lesions. No uvula swelling. No oropharyngeal exudate, posterior oropharyngeal edema, posterior oropharyngeal erythema or tonsillar abscesses.  Eyes: Conjunctivae and EOM are normal. Pupils are equal, round, and reactive to light. Right eye exhibits no chemosis, no discharge and no exudate. Left eye exhibits no chemosis, no discharge and no exudate. Right conjunctiva is not injected. Left conjunctiva is not injected. No scleral icterus.  Neck: Normal range of motion, full passive range of motion without pain and phonation normal. Neck supple. No rigidity. No Brudzinski's sign noted.  Cardiovascular: Normal rate, regular rhythm, intact distal pulses and normal pulses.   No extrasystoles are present.  Pulmonary/Chest: Effort normal and breath sounds normal. No accessory muscle usage. Not tachypneic. No respiratory distress. He has no decreased breath sounds. He has no wheezes. He has no rhonchi. He has no rales. He exhibits no tenderness, no crepitus and no retraction.  Abdominal: Soft. Normal appearance. He exhibits no shifting dullness, no distension, no pulsatile liver, no fluid wave, no abdominal bruit, no ascites, no pulsatile midline mass and no mass. Bowel sounds are increased. There is no hepatosplenomegaly. There is generalized tenderness. There is no rigidity, no rebound, no guarding and no CVA tenderness. No hernia.  Musculoskeletal: Normal range of motion.  Neurological: He is alert and oriented to person, place, and time. He has normal strength and normal reflexes. He is not disoriented. No cranial nerve deficit. Coordination normal. GCS eye subscore is 4. GCS verbal subscore is 5. GCS motor subscore is 6.  Skin: Skin is warm, dry and intact. No bruising, no ecchymosis, no lesion and no rash noted. He is not diaphoretic. No erythema. No  pallor.  Psychiatric: He has a normal mood and affect. His speech is normal and behavior is normal. Judgment and thought content normal. Cognition and memory are normal.    ED Course  Procedures (including critical care time)   Labs Reviewed  CBC  DIFFERENTIAL  COMPREHENSIVE METABOLIC PANEL  LIPASE, BLOOD   No results found.   No diagnosis found.    MDM  Cyclical vomiting syndrome, gastritis, peptic ulcer disease, biliary colic, cholelithiasis, cholecystitis, cholangitis, hepatitis, renal colic, urinary tract infection, gastroenteritis all considered among other etiologies in the patient's differential diagnosis.         Felisa Bonier, MD 06/29/11 0731  10:04 AM The patient has had complete resolution of his symptoms at this time, with no further nausea, vomiting, or abdominal pain. He states that he feels "great". I will discharge him home for outpatient management of cyclic vomiting syndrome. The patient states his understanding of and agreement with the plan of care.  Felisa Bonier, MD 06/29/11 1005

## 2011-06-29 NOTE — ED Notes (Signed)
Pt alert and oriented x4. Respirations even and unlabored, bilateral symmetrical rise and fall of chest. Skin warm and dry. In no acute distress. Denies needs.   

## 2011-06-29 NOTE — ED Notes (Signed)
Pt c/o vomiting x 8 hrs. Hx of cyclical vomiting syndrome. Zofran ODT w/o relief.

## 2011-06-29 NOTE — ED Notes (Signed)
Benjamin Valdez (brother) [phone]

## 2011-08-11 ENCOUNTER — Other Ambulatory Visit: Payer: Self-pay | Admitting: *Deleted

## 2011-08-11 MED ORDER — OMEPRAZOLE 40 MG PO CPDR: 40.0000 mg | DELAYED_RELEASE_CAPSULE | Freq: Every day | ORAL | Status: DC

## 2011-10-12 ENCOUNTER — Other Ambulatory Visit: Payer: Self-pay | Admitting: Cardiology

## 2011-10-12 DIAGNOSIS — F419 Anxiety disorder, unspecified: Secondary | ICD-10-CM

## 2011-10-12 NOTE — Telephone Encounter (Signed)
New Problem:    Patient's mother called it needing a refill of her son's diazepam (VALIUM) 5 MG tablet.  Please call back when the order has been placed.

## 2011-10-13 NOTE — Telephone Encounter (Signed)
Out of pills 

## 2011-10-14 MED ORDER — DIAZEPAM 5 MG PO TABS: 5.0000 mg | ORAL_TABLET | Freq: Three times a day (TID) | ORAL | Status: DC | PRN

## 2011-12-08 ENCOUNTER — Other Ambulatory Visit: Payer: Self-pay | Admitting: *Deleted

## 2011-12-08 MED ORDER — ROSUVASTATIN CALCIUM 20 MG PO TABS: 20.0000 mg | ORAL_TABLET | Freq: Every day | ORAL | Status: DC

## 2011-12-08 NOTE — Telephone Encounter (Signed)
Fax Received. Refill Completed. Benjamin Valdez (R.M.A)   

## 2012-02-12 ENCOUNTER — Encounter (HOSPITAL_COMMUNITY): Payer: Self-pay | Admitting: Emergency Medicine

## 2012-02-12 ENCOUNTER — Emergency Department (HOSPITAL_COMMUNITY)
Admission: EM | Admit: 2012-02-12 | Discharge: 2012-02-12 | Disposition: A | Discharge status: Home / Self Care | Payer: No Typology Code available for payment source | Attending: Emergency Medicine | Admitting: Emergency Medicine

## 2012-02-12 ENCOUNTER — Emergency Department (HOSPITAL_COMMUNITY): Payer: No Typology Code available for payment source

## 2012-02-12 DIAGNOSIS — R110 Nausea: Secondary | ICD-10-CM | POA: Insufficient documentation

## 2012-02-12 DIAGNOSIS — R51 Headache: Secondary | ICD-10-CM

## 2012-02-12 DIAGNOSIS — Z8589 Personal history of malignant neoplasm of other organs and systems: Secondary | ICD-10-CM | POA: Insufficient documentation

## 2012-02-12 DIAGNOSIS — Y9389 Activity, other specified: Secondary | ICD-10-CM | POA: Insufficient documentation

## 2012-02-12 DIAGNOSIS — Z8719 Personal history of other diseases of the digestive system: Secondary | ICD-10-CM | POA: Insufficient documentation

## 2012-02-12 DIAGNOSIS — E785 Hyperlipidemia, unspecified: Secondary | ICD-10-CM | POA: Insufficient documentation

## 2012-02-12 DIAGNOSIS — S0990XA Unspecified injury of head, initial encounter: Secondary | ICD-10-CM | POA: Insufficient documentation

## 2012-02-12 DIAGNOSIS — S88119A Complete traumatic amputation at level between knee and ankle, unspecified lower leg, initial encounter: Secondary | ICD-10-CM | POA: Insufficient documentation

## 2012-02-12 DIAGNOSIS — I10 Essential (primary) hypertension: Secondary | ICD-10-CM | POA: Insufficient documentation

## 2012-02-12 DIAGNOSIS — F1210 Cannabis abuse, uncomplicated: Secondary | ICD-10-CM | POA: Insufficient documentation

## 2012-02-12 DIAGNOSIS — Z85830 Personal history of malignant neoplasm of bone: Secondary | ICD-10-CM | POA: Insufficient documentation

## 2012-02-12 MED ORDER — OXYCODONE-ACETAMINOPHEN 5-325 MG PO TABS: ORAL_TABLET | ORAL | Status: DC
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
## 2012-02-12 NOTE — ED Provider Notes (Signed)
Medical screening examination/treatment/procedure(s) were performed by non-physician practitioner and as supervising physician I was immediately available for consultation/collaboration.  Jamesyn Lindell, MD 02/12/12 2330 

## 2012-02-12 NOTE — Discharge Instructions (Signed)
Return to the emergency room for any worsening headache, weakness, nausea vomiting, chest pain, shortness of breath. Otherwise please follow with your primary care physician in the next 24-48 hours.
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
## 2012-02-12 NOTE — ED Notes (Signed)
Patient given discharge instructions, information, prescriptions, and diet order. Patient states that they adequately understand discharge information given and to return to ED if symptoms return or worsen.     

## 2012-02-16 ENCOUNTER — Ambulatory Visit (INDEPENDENT_AMBULATORY_CARE_PROVIDER_SITE_OTHER): Payer: Medicare Other | Admitting: Cardiology

## 2012-02-16 ENCOUNTER — Encounter: Payer: Self-pay | Admitting: Cardiology

## 2012-02-16 VITALS — BP 122/66 | HR 68 | Resp 18 | Ht 72.0 in | Wt 190.0 lb

## 2012-02-16 DIAGNOSIS — E7800 Pure hypercholesterolemia, unspecified: Secondary | ICD-10-CM

## 2012-02-16 LAB — HEPATIC FUNCTION PANEL
ALT: 18 U/L (ref 0–53)
AST: 23 U/L (ref 0–37)
Alkaline Phosphatase: 64 U/L (ref 39–117)
Total Bilirubin: 0.5 mg/dL (ref 0.3–1.2)

## 2012-02-16 LAB — BASIC METABOLIC PANEL
Calcium: 9.4 mg/dL (ref 8.4–10.5)
Creatinine, Ser: 1.3 mg/dL (ref 0.4–1.5)
GFR: 61.39 mL/min (ref >60.00)
Glucose, Bld: 87 mg/dL (ref 70–99)
Sodium: 141 mEq/L (ref 135–145)

## 2012-02-16 LAB — LIPID PANEL: HDL: 47 mg/dL (ref >39.00)
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
## 2012-02-16 NOTE — Patient Instructions (Addendum)
Your physician wants you to follow-up in: 1 Year You will receive a reminder letter in the mail two months in advance. If you don't receive a letter, please call our office to schedule the follow-up appointment.   Will Obtain Labs today BMET/ HFP/ Lipid  Your physician recommends that you continue on your current medications as directed. Please refer to the Current Medication list given to you today.

## 2012-02-16 NOTE — Assessment & Plan Note (Signed)
The patient has a history of dyslipidemia.  He is tolerating Crestor 20 mg daily.  Is not having any myalgias.  We're checking lab work fasting today

## 2012-02-16 NOTE — Progress Notes (Signed)
Quick Note:  Please report to patient. The recent labs are stable. Continue same medication and careful diet. Cholesterol higher so work harder on diet and continue daily crestor. ______

## 2012-02-17 ENCOUNTER — Telehealth: Payer: Self-pay | Admitting: *Deleted

## 2012-02-17 NOTE — Telephone Encounter (Signed)
Message copied by Burnell Blanks on Wed Feb 17, 2012  9:38 AM ------      Message from: Cassell Clement      Created: Tue Feb 16, 2012  9:18 PM       Please report to patient.  The recent labs are stable. Continue same medication and careful diet. Cholesterol higher so work harder on diet and continue daily crestor.

## 2012-02-17 NOTE — Telephone Encounter (Signed)
Mailed copy of labs and left message to call if any questions  

## 2012-02-19 ENCOUNTER — Other Ambulatory Visit: Payer: Self-pay | Admitting: *Deleted

## 2012-02-19 DIAGNOSIS — F419 Anxiety disorder, unspecified: Secondary | ICD-10-CM

## 2012-02-21 MED ORDER — DIAZEPAM 5 MG PO TABS: 5.0000 mg | ORAL_TABLET | Freq: Three times a day (TID) | ORAL | Status: DC | PRN

## 2012-03-15 ENCOUNTER — Other Ambulatory Visit: Payer: Self-pay | Admitting: *Deleted

## 2012-03-15 DIAGNOSIS — F419 Anxiety disorder, unspecified: Secondary | ICD-10-CM

## 2012-03-15 NOTE — Telephone Encounter (Signed)
Opened in error

## 2012-03-29 ENCOUNTER — Other Ambulatory Visit: Payer: Self-pay | Admitting: *Deleted

## 2012-03-29 MED ORDER — OMEPRAZOLE 40 MG PO CPDR: 40.0000 mg | DELAYED_RELEASE_CAPSULE | Freq: Every day | ORAL | Status: DC

## 2012-04-19 ENCOUNTER — Other Ambulatory Visit (HOSPITAL_COMMUNITY): Payer: Self-pay | Admitting: Psychiatry

## 2012-05-06 ENCOUNTER — Other Ambulatory Visit: Payer: Self-pay

## 2012-05-06 MED ORDER — ROSUVASTATIN CALCIUM 20 MG PO TABS: 20.0000 mg | ORAL_TABLET | Freq: Every day | ORAL | Status: DC

## 2012-05-26 ENCOUNTER — Other Ambulatory Visit (HOSPITAL_COMMUNITY): Payer: Self-pay | Admitting: Psychiatry

## 2012-06-28 ENCOUNTER — Emergency Department (HOSPITAL_COMMUNITY)
Admission: EM | Admit: 2012-06-28 | Discharge: 2012-06-28 | Disposition: A | Discharge status: Home / Self Care | Payer: Medicare Other | Attending: Emergency Medicine | Admitting: Emergency Medicine

## 2012-06-28 ENCOUNTER — Encounter (HOSPITAL_COMMUNITY): Payer: Self-pay | Admitting: Emergency Medicine

## 2012-06-28 DIAGNOSIS — Z8589 Personal history of malignant neoplasm of other organs and systems: Secondary | ICD-10-CM | POA: Insufficient documentation

## 2012-06-28 DIAGNOSIS — R112 Nausea with vomiting, unspecified: Secondary | ICD-10-CM | POA: Insufficient documentation

## 2012-06-28 DIAGNOSIS — F1210 Cannabis abuse, uncomplicated: Secondary | ICD-10-CM | POA: Insufficient documentation

## 2012-06-28 DIAGNOSIS — Z8719 Personal history of other diseases of the digestive system: Secondary | ICD-10-CM | POA: Insufficient documentation

## 2012-06-28 DIAGNOSIS — I10 Essential (primary) hypertension: Secondary | ICD-10-CM | POA: Insufficient documentation

## 2012-06-28 DIAGNOSIS — Z85830 Personal history of malignant neoplasm of bone: Secondary | ICD-10-CM | POA: Insufficient documentation

## 2012-06-28 DIAGNOSIS — R1084 Generalized abdominal pain: Secondary | ICD-10-CM | POA: Insufficient documentation

## 2012-06-28 DIAGNOSIS — E785 Hyperlipidemia, unspecified: Secondary | ICD-10-CM | POA: Insufficient documentation

## 2012-06-28 DIAGNOSIS — Z79899 Other long term (current) drug therapy: Secondary | ICD-10-CM | POA: Insufficient documentation

## 2012-06-28 DIAGNOSIS — R1110 Vomiting, unspecified: Secondary | ICD-10-CM

## 2012-06-28 LAB — CBC WITH DIFFERENTIAL/PLATELET
Eosinophils Relative: 0 % (ref 0–5)
HCT: 42.9 % (ref 39.0–52.0)
Hemoglobin: 15.2 g/dL (ref 13.0–17.0)
Lymphocytes Relative: 8 % — ABNORMAL LOW (ref 12–46)
Lymphs Abs: 0.8 10*3/uL (ref 0.7–4.0)
MCV: 85.5 fL (ref 78.0–100.0)
Monocytes Absolute: 0.9 10*3/uL (ref 0.1–1.0)
Monocytes Relative: 9 % (ref 3–12)
Neutro Abs: 8.6 10*3/uL — ABNORMAL HIGH (ref 1.7–7.7)
RBC: 5.02 MIL/uL (ref 4.22–5.81)
RDW: 13.2 % (ref 11.5–15.5)
WBC: 10.3 10*3/uL (ref 4.0–10.5)

## 2012-06-28 LAB — BASIC METABOLIC PANEL
BUN: 24 mg/dL — ABNORMAL HIGH (ref 6–23)
CO2: 24 mEq/L (ref 19–32)
Calcium: 10.2 mg/dL (ref 8.4–10.5)
Chloride: 99 mEq/L (ref 96–112)
Creatinine, Ser: 1.02 mg/dL (ref 0.50–1.35)
Glucose, Bld: 139 mg/dL — ABNORMAL HIGH (ref 70–99)

## 2012-06-28 MED ORDER — METOCLOPRAMIDE HCL 5 MG/ML IJ SOLN
10.0000 mg | Freq: Once | INTRAMUSCULAR | Status: AC
  Administered 2012-06-28: 10 mg via INTRAVENOUS
  Filled 2012-06-28: qty 2

## 2012-06-28 MED ORDER — ONDANSETRON HCL 4 MG/2ML IJ SOLN
4.0000 mg | Freq: Once | INTRAMUSCULAR | Status: AC
  Administered 2012-06-28: 4 mg via INTRAVENOUS
  Filled 2012-06-28: qty 2

## 2012-06-28 MED ORDER — PROMETHAZINE HCL 25 MG PO TABS: 25.0000 mg | ORAL_TABLET | Freq: Four times a day (QID) | ORAL | Status: DC | PRN

## 2012-06-28 NOTE — ED Notes (Signed)
Per patient, vomiting since yesterday-had radiation 13 years ago-developed cyclic vomiting syndrome-does'nt know why he has N/V-feels as though he needs rehydration

## 2012-06-28 NOTE — ED Notes (Signed)
Bed:WA19<BR> Expected date:<BR> Expected time:<BR> Means of arrival:<BR> Comments:<BR> Hold for triage

## 2012-06-28 NOTE — ED Provider Notes (Signed)
History     CSN: [id number]  Arrival date & time 06/28/12  4098   First MD Initiated Contact with Patient 06/28/12 0815      Chief Complaint  Patient presents with  . Emesis    (Consider location/radiation/quality/duration/timing/severity/associated sxs/prior treatment) HPI Comments: Patient presents to the ER for evaluation of nausea and vomiting with abdominal pain. Patient reports that he has a history of cyclic vomiting syndrome. He has been trying to early hydrate at home but feels very dehydrated. Patient reports moderate abdominal pain which is crampy in nature. He has had multiple episodes of emesis without any diarrhea. No upper respiratory symptoms.  Patient is a 52 y.o. male presenting with vomiting.  Emesis Associated symptoms: abdominal pain   Associated symptoms: no diarrhea     Past Medical History  Diagnosis Date  . Bone cancer     has left BK amputation  . Neck problem   . Nausea alone     Recurrent  . Angiosarcoma     Of the left leg  . Vomiting   . Chest pain April 2010    Myocardial Infarction ruled out  . Gastritis April 2010    Acute--Resolved  . Essential hypertension   . Dyslipidemia   . Hiatal hernia   . Marijuana abuse     History of Marijuana use    Past Surgical History  Procedure Laterality Date  . Appendectomy    . Amputation      Left leg below the knee   . Cervical laminectomy      No family history on file.  History  Substance Use Topics  . Smoking status: Never Smoker   . Smokeless tobacco: Not on file  . Alcohol Use: No      Review of Systems  Gastrointestinal: Positive for nausea, vomiting and abdominal pain. Negative for diarrhea and constipation.  All other systems reviewed and are negative.    Allergies  Review of patient's allergies indicates no known allergies.  Home Medications   Current Outpatient Rx  Name  Route  Sig  Dispense  Refill  . amitriptyline (ELAVIL) 50 MG tablet   Oral   Take 50 mg by  mouth daily.           . diazepam (VALIUM) 5 MG tablet   Oral   Take 1 tablet (5 mg total) by mouth every 8 (eight) hours as needed. Anxiety   100 tablet   4   . omeprazole (PRILOSEC) 40 MG capsule   Oral   Take 1 capsule (40 mg total) by mouth daily.   30 capsule   6   . oxyCODONE-acetaminophen (PERCOCET/ROXICET) 5-325 MG per tablet      1 to 2 tabs PO q6hrs  PRN for pain   15 tablet   0   . PARoxetine (PAXIL) 30 MG tablet   Oral   Take 45 mg by mouth every morning. Pt takes 1.5 tablets (45 mg total)         . rosuvastatin (CRESTOR) 20 MG tablet   Oral   Take 1 tablet (20 mg total) by mouth daily.   30 tablet   4     BP 153/98  Pulse 100  Temp(Src) 99.1 F (37.3 C) (Oral)  Resp 18  SpO2 100%  Physical Exam  Constitutional: He is oriented to person, place, and time. He appears well-developed and well-nourished. No distress.  HENT:  Head: Normocephalic and atraumatic.  Right Ear: Hearing normal.  Nose:  Nose normal.  Mouth/Throat: Oropharynx is clear and moist and mucous membranes are normal.  Eyes: Conjunctivae and EOM are normal. Pupils are equal, round, and reactive to light.  Neck: Normal range of motion. Neck supple.  Cardiovascular: Normal rate, regular rhythm, S1 normal and S2 normal.  Exam reveals no gallop and no friction rub.   No murmur heard. Pulmonary/Chest: Effort normal and breath sounds normal. No respiratory distress. He exhibits no tenderness.  Abdominal: Soft. Normal appearance and bowel sounds are normal. There is no hepatosplenomegaly. There is generalized tenderness. There is no rebound, no guarding, no tenderness at McBurney's point and negative Murphy's sign. No hernia.  Musculoskeletal: Normal range of motion.  Neurological: He is alert and oriented to person, place, and time. He has normal strength. No cranial nerve deficit or sensory deficit. Coordination normal. GCS eye subscore is 4. GCS verbal subscore is 5. GCS motor subscore is 6.   Skin: Skin is warm, dry and intact. No rash noted. No cyanosis.  Psychiatric: He has a normal mood and affect. His speech is normal and behavior is normal. Thought content normal.    ED Course  Procedures (including critical care time)  Labs Reviewed  CBC WITH DIFFERENTIAL - Abnormal; Notable for the following:    Neutrophils Relative 83 (*)    Neutro Abs 8.6 (*)    Lymphocytes Relative 8 (*)    All other components within normal limits  BASIC METABOLIC PANEL - Abnormal; Notable for the following:    Glucose, Bld 139 (*)    BUN 24 (*)    GFR calc non Af Amer 83 (*)    All other components within normal limits   No results found.   Diagnosis: Vomiting secondary to cyclic vomiting syndrome    MDM  Patient presents to the ER with complaint of nausea and vomiting. He has had these symptoms frequently over the last 13 years and has been diagnosed with cyclic vomiting syndrome by his gastroenterologist. Patient's symptoms have completely resolved with IV fluids, Zofran and Reglan. Labs were unremarkable. Patient will be discharged, continue outpatient treatment as needed.       Gilda Crease, MD 06/28/12 1000

## 2012-07-21 ENCOUNTER — Other Ambulatory Visit: Payer: Self-pay | Admitting: *Deleted

## 2012-07-21 DIAGNOSIS — F419 Anxiety disorder, unspecified: Secondary | ICD-10-CM

## 2012-07-21 MED ORDER — DIAZEPAM 5 MG PO TABS: 5.0000 mg | ORAL_TABLET | Freq: Three times a day (TID) | ORAL | Status: DC | PRN

## 2012-09-07 ENCOUNTER — Other Ambulatory Visit: Payer: Self-pay | Admitting: *Deleted

## 2012-09-07 MED ORDER — ROSUVASTATIN CALCIUM 20 MG PO TABS: 20.0000 mg | ORAL_TABLET | Freq: Every day | ORAL | Status: DC

## 2012-10-26 ENCOUNTER — Other Ambulatory Visit: Payer: Self-pay | Admitting: *Deleted

## 2012-10-26 MED ORDER — OMEPRAZOLE 40 MG PO CPDR: 40.0000 mg | DELAYED_RELEASE_CAPSULE | Freq: Every day | ORAL | Status: DC

## 2012-11-22 ENCOUNTER — Emergency Department (HOSPITAL_COMMUNITY)
Admission: EM | Admit: 2012-11-22 | Discharge: 2012-11-22 | Disposition: A | Discharge status: Home / Self Care | Payer: Medicare Other | Attending: Emergency Medicine | Admitting: Emergency Medicine

## 2012-11-22 ENCOUNTER — Encounter (HOSPITAL_COMMUNITY): Payer: Self-pay | Admitting: Emergency Medicine

## 2012-11-22 DIAGNOSIS — Z8719 Personal history of other diseases of the digestive system: Secondary | ICD-10-CM | POA: Insufficient documentation

## 2012-11-22 DIAGNOSIS — Z85830 Personal history of malignant neoplasm of bone: Secondary | ICD-10-CM | POA: Insufficient documentation

## 2012-11-22 DIAGNOSIS — Z79899 Other long term (current) drug therapy: Secondary | ICD-10-CM | POA: Insufficient documentation

## 2012-11-22 DIAGNOSIS — R1115 Cyclical vomiting syndrome unrelated to migraine: Secondary | ICD-10-CM | POA: Insufficient documentation

## 2012-11-22 DIAGNOSIS — I10 Essential (primary) hypertension: Secondary | ICD-10-CM | POA: Insufficient documentation

## 2012-11-22 DIAGNOSIS — R197 Diarrhea, unspecified: Secondary | ICD-10-CM | POA: Insufficient documentation

## 2012-11-22 DIAGNOSIS — R109 Unspecified abdominal pain: Secondary | ICD-10-CM | POA: Insufficient documentation

## 2012-11-22 DIAGNOSIS — R61 Generalized hyperhidrosis: Secondary | ICD-10-CM | POA: Insufficient documentation

## 2012-11-22 DIAGNOSIS — E785 Hyperlipidemia, unspecified: Secondary | ICD-10-CM | POA: Insufficient documentation

## 2012-11-22 LAB — CBC WITH DIFFERENTIAL/PLATELET
Basophils Absolute: 0 10*3/uL (ref 0.0–0.1)
Eosinophils Absolute: 0 10*3/uL (ref 0.0–0.7)
Eosinophils Relative: 0 % (ref 0–5)
HCT: 45.1 % (ref 39.0–52.0)
Lymphocytes Relative: 8 % — ABNORMAL LOW (ref 12–46)
MCH: 30.4 pg (ref 26.0–34.0)
MCV: 85.1 fL (ref 78.0–100.0)
Monocytes Absolute: 0.9 10*3/uL (ref 0.1–1.0)
Platelets: 256 10*3/uL (ref 150–400)
RDW: 12.7 % (ref 11.5–15.5)

## 2012-11-22 LAB — COMPREHENSIVE METABOLIC PANEL
BUN: 34 mg/dL — ABNORMAL HIGH (ref 6–23)
CO2: 20 mEq/L (ref 19–32)
Calcium: 10.7 mg/dL — ABNORMAL HIGH (ref 8.4–10.5)
Creatinine, Ser: 1.11 mg/dL (ref 0.50–1.35)
GFR calc Af Amer: 87 mL/min — ABNORMAL LOW (ref >90)
GFR calc non Af Amer: 75 mL/min — ABNORMAL LOW (ref >90)
Glucose, Bld: 209 mg/dL — ABNORMAL HIGH (ref 70–99)

## 2012-11-22 LAB — LIPASE, BLOOD: Lipase: 22 U/L (ref 11–59)

## 2012-11-22 MED ORDER — SODIUM CHLORIDE 0.9 % IV SOLN
1000.0000 mL | Freq: Once | INTRAVENOUS | Status: AC
  Administered 2012-11-22: 1000 mL via INTRAVENOUS

## 2012-11-22 MED ORDER — ONDANSETRON HCL 4 MG/2ML IJ SOLN
4.0000 mg | Freq: Once | INTRAMUSCULAR | Status: AC
  Administered 2012-11-22: 4 mg via INTRAVENOUS
  Filled 2012-11-22: qty 2

## 2012-11-22 MED ORDER — METOCLOPRAMIDE HCL 5 MG/ML IJ SOLN
10.0000 mg | Freq: Once | INTRAMUSCULAR | Status: AC
  Administered 2012-11-22: 10 mg via INTRAVENOUS
  Filled 2012-11-22: qty 2

## 2012-11-22 MED ORDER — HYDROMORPHONE HCL PF 1 MG/ML IJ SOLN
1.0000 mg | Freq: Once | INTRAMUSCULAR | Status: AC
  Administered 2012-11-22: 1 mg via INTRAVENOUS
  Filled 2012-11-22: qty 1

## 2012-11-22 NOTE — ED Notes (Signed)
Pt arrived to the ED with a chief complaint of emesis.  Pt states he is a cancer pt and has cyclical vomiting syndrome.  Pt has been vomiting since 0100 today.  Pt also has diarrhea.  Pt has had x2 times of diarrhea.

## 2012-11-22 NOTE — ED Notes (Addendum)
Pt given discharge instructions and verbalized understanding. Vital signs were reviewed and are within normal limits.

## 2012-11-22 NOTE — ED Provider Notes (Signed)
CSN: [id number]     Arrival date & time 11/22/12  0702 History     First MD Initiated Contact with Patient 11/22/12 0720     Chief Complaint  Patient presents with  . Emesis   (Consider location/radiation/quality/duration/timing/severity/associated sxs/prior Treatment) The history is provided by the patient and medical records.   Pt presents to the ED for profuse, non-bloody emsis since approx 0100.  Pt has dx of cyclic vomiting syndrome, sx consistent with his usual flare.  Pt has also had 2 episodes of non-bloody, watery diarrhea.  Pt was recently on doxycycline for oral surgery ppx.  Pt also complains of diffuse abdominal pain from all the retching.  No abdominal pain prior to onset of vomiting.  Pt denies any new changes in meds or diet.  No sick contacts.  No fevers, sweats, or chills.  Pt is diaphoretic on arrival.  VS stable.  Past Medical History  Diagnosis Date  . Bone cancer     has left BK amputation  . Neck problem   . Nausea alone     Recurrent  . Angiosarcoma     Of the left leg  . Vomiting   . Chest pain April 2010    Myocardial Infarction ruled out  . Gastritis April 2010    Acute--Resolved  . Essential hypertension   . Dyslipidemia   . Hiatal hernia   . Marijuana abuse     History of Marijuana use   Past Surgical History  Procedure Laterality Date  . Appendectomy    . Amputation      Left leg below the knee   . Cervical laminectomy     History reviewed. No pertinent family history. History  Substance Use Topics  . Smoking status: Never Smoker   . Smokeless tobacco: Not on file  . Alcohol Use: No    Review of Systems  Gastrointestinal: Positive for nausea, vomiting, abdominal pain and diarrhea.  All other systems reviewed and are negative.    Allergies  Review of patient's allergies indicates no known allergies.  Home Medications   Current Outpatient Rx  Name  Route  Sig  Dispense  Refill  . amitriptyline (ELAVIL) 50 MG tablet   Oral  Take 50 mg by mouth daily.           . diazepam (VALIUM) 5 MG tablet   Oral   Take 1 tablet (5 mg total) by mouth every 8 (eight) hours as needed. Anxiety   100 tablet   4   . omeprazole (PRILOSEC) 40 MG capsule   Oral   Take 1 capsule (40 mg total) by mouth daily.   30 capsule   3   . PARoxetine (PAXIL) 30 MG tablet   Oral   Take 45 mg by mouth every morning. Pt takes 1.5 tablets (45 mg total)         . EXPIRED: promethazine (PHENERGAN) 25 MG suppository   Rectal   Place 1 suppository (25 mg total) rectally every 6 (six) hours as needed for nausea.   12 each   0   . promethazine (PHENERGAN) 25 MG tablet   Oral   Take 1 tablet (25 mg total) by mouth every 6 (six) hours as needed for nausea.   30 tablet   0   . rosuvastatin (CRESTOR) 20 MG tablet   Oral   Take 1 tablet (20 mg total) by mouth daily.   30 tablet   11    BP 179/97  Pulse 77  Temp(Src) 98.7 F (37.1 C) (Oral)  Resp 18  SpO2 100%  Physical Exam  Nursing note and vitals reviewed. Constitutional: He is oriented to person, place, and time. He appears well-developed and well-nourished.  Appear uncomfortable, actively vomiting  HENT:  Head: Normocephalic and atraumatic.  Mouth/Throat: Oropharynx is clear and moist.  Eyes: Conjunctivae and EOM are normal. Pupils are equal, round, and reactive to light.  Neck: Normal range of motion.  Cardiovascular: Normal rate, regular rhythm and normal heart sounds.   Pulmonary/Chest: Effort normal and breath sounds normal.  Abdominal: Soft. Bowel sounds are normal. There is generalized tenderness. There is no guarding.  Left BK amputation  Neurological: He is alert and oriented to person, place, and time.  Skin: Skin is warm. He is diaphoretic.  Psychiatric: He has a normal mood and affect.    ED Course   Procedures (including critical care time)  Labs Reviewed  CBC WITH DIFFERENTIAL - Abnormal; Notable for the following:    WBC 14.6 (*)    Neutrophils  Relative % 86 (*)    Neutro Abs 12.6 (*)    Lymphocytes Relative 8 (*)    All other components within normal limits  COMPREHENSIVE METABOLIC PANEL - Abnormal; Notable for the following:    Glucose, Bld 209 (*)    BUN 34 (*)    Calcium 10.7 (*)    GFR calc non Af Amer 75 (*)    GFR calc Af Amer 87 (*)    All other components within normal limits  CLOSTRIDIUM DIFFICILE BY PCR  LIPASE, BLOOD   No results found.  1. Cyclic vomiting syndrome     MDM   8:38 AM Second round of meds given.  Pt no longer vomiting but still nauseated.  Requesting PO ice chips.  Will allow and see how he tolerates.  Plan to progress to liquids.  Labs as above-- leukocytosis at 14.6, likely due to vomiting.  Electrolytes stable.  10:14 AM Pt re-evaluated.  No longer diaphoretic, abdominal pain resolved, tolerating PO drinks x2, ambulating around the ED, feeling much better without recurrent vomiting.  I doubt acute/surgical abdomen and do not feel further workup is needed at this time.  Pt has anti-emetics at home that he will take.  FU with PCP and/or GI as needed.  Discussed plan with pt, he agreed.  Return precautions advised.  Garlon Hatchet, PA-C 11/22/12 1439

## 2012-11-22 NOTE — ED Notes (Signed)
Pt ambulated to restroom, pt c/o his stomach hurting again and lying back in the bed. Marcelino Duster PA aware.

## 2012-11-22 NOTE — ED Notes (Signed)
Pt. stood up and walked into the hall and stated "I am feeling one hundred percent better"

## 2012-11-22 NOTE — ED Notes (Signed)
Gave pt cup ginger ale.

## 2012-11-24 NOTE — ED Provider Notes (Signed)
Medical screening examination/treatment/procedure(s) were performed by non-physician practitioner and as supervising physician I was immediately available for consultation/collaboration.  Candyce Churn, MD 11/24/12 [phone]

## 2012-12-21 ENCOUNTER — Other Ambulatory Visit: Payer: Self-pay

## 2012-12-21 DIAGNOSIS — F419 Anxiety disorder, unspecified: Secondary | ICD-10-CM

## 2012-12-21 MED ORDER — DIAZEPAM 5 MG PO TABS: 5.0000 mg | ORAL_TABLET | Freq: Three times a day (TID) | ORAL | Status: DC | PRN

## 2012-12-21 NOTE — Telephone Encounter (Signed)
[facility] called requesting Valium, refilled as requested

## 2013-02-16 ENCOUNTER — Encounter: Payer: Self-pay | Admitting: Cardiology

## 2013-02-16 ENCOUNTER — Encounter (INDEPENDENT_AMBULATORY_CARE_PROVIDER_SITE_OTHER): Payer: Self-pay

## 2013-02-16 ENCOUNTER — Ambulatory Visit (INDEPENDENT_AMBULATORY_CARE_PROVIDER_SITE_OTHER): Payer: Medicare Other | Admitting: Cardiology

## 2013-02-16 VITALS — BP 110/74 | HR 83 | Ht 72.0 in | Wt 172.0 lb

## 2013-02-16 DIAGNOSIS — E7800 Pure hypercholesterolemia, unspecified: Secondary | ICD-10-CM

## 2013-02-16 DIAGNOSIS — C419 Malignant neoplasm of bone and articular cartilage, unspecified: Secondary | ICD-10-CM

## 2013-02-16 LAB — HEPATIC FUNCTION PANEL
AST: 21 U/L (ref 0–37)
Albumin: 4.3 g/dL (ref 3.5–5.2)
Alkaline Phosphatase: 58 U/L (ref 39–117)
Bilirubin, Direct: 0 mg/dL (ref 0.0–0.3)
Total Protein: 7.2 g/dL (ref 6.0–8.3)

## 2013-02-16 LAB — BASIC METABOLIC PANEL
BUN: 22 mg/dL (ref 6–23)
Calcium: 9.5 mg/dL (ref 8.4–10.5)
Creatinine, Ser: 1.1 mg/dL (ref 0.4–1.5)

## 2013-02-16 LAB — LIPID PANEL
Cholesterol: 176 mg/dL (ref 0–200)
Triglycerides: 65 mg/dL (ref 0.0–149.0)

## 2013-02-16 NOTE — Assessment & Plan Note (Signed)
He is followed at [facility].  No evidence of any recurrent sarcoma of the bone.

## 2013-02-16 NOTE — Progress Notes (Signed)
Quick Note:  Please report to patient. The recent labs are stable. Continue same medication and careful diet. LDL better. BS normal since his weight loss. ______

## 2013-02-16 NOTE — Assessment & Plan Note (Signed)
The patient has familial hypercholesterolemia and is on Crestor 20 mg daily.  He has not had any myalgias.  We are checking fasting lab work today.

## 2013-02-16 NOTE — Patient Instructions (Signed)
Will obtain labs today and call you with the results (LP/BMET/HFP)  Your physician recommends that you continue on your current medications as directed. Please refer to the Current Medication list given to you today.  Your physician wants you to follow-up in: 1 YEAR OV/LP/BMET/HFP You will receive a reminder letter in the mail two months in advance. If you don't receive a letter, please call our office to schedule the follow-up appointment.

## 2013-02-16 NOTE — Progress Notes (Signed)
Benjamin Valdez Date of Birth:  25-Jan-1961 [address] [place], [place]  [address] [phone]  Fax   [phone]  HPI: This pleasant 52 year old gentleman is seen for a one-year followup office visit. He has a history of hypercholesterolemia and a strong family history of coronary disease. He, himself does not have any known ischemic heart disease. He had a normal nuclear stress test in 2009.  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  He exercises regularly.  He swims at the [facility].  Last year he swam a total of 823 miles and set the record for number of miles swum in one year.  He has a past history of some depression and is on amitriptyline and Paxil and occasional Valium.  Current Outpatient Prescriptions  Medication Sig Dispense Refill  . amitriptyline (ELAVIL) 50 MG tablet Take 50 mg by mouth at bedtime.      . diazepam (VALIUM) 5 MG tablet Take 1 tablet (5 mg total) by mouth every 8 (eight) hours as needed.  100 tablet  1  . omeprazole (PRILOSEC) 40 MG capsule Take 40 mg by mouth daily.      [name] Kitchen PARoxetine (PAXIL) 30 MG tablet Take 45 mg by mouth every morning.      . promethazine (PHENERGAN) 25 MG suppository Place 25 mg rectally every 6 (six) hours as needed for nausea.      . rosuvastatin (CRESTOR) 20 MG tablet Take 20 mg by mouth daily.       No current facility-administered medications for this visit.    No Known Allergies  Patient Active Problem List   Diagnosis Date Noted  . Hypercholesterolemia 01/28/2011  . Bone sarcoma 01/28/2011    History  Smoking status  . Never Smoker   Smokeless tobacco  . Not on file    History  Alcohol Use No    No family history on file.  Review of Systems: The patient denies any heat or cold intolerance.  No weight gain or weight loss.  The patient denies headaches or blurry vision.  There is no cough or sputum production.  The patient denies dizziness.  There is no  hematuria or hematochezia.  The patient denies any muscle aches or arthritis.  The patient denies any rash.  The patient denies frequent falling or instability.  There is no history of depression or anxiety.  All other systems were reviewed and are negative.   Physical Exam: Filed Vitals:   02/16/13 0949  BP: 110/74  Pulse: 83   the general appearance reveals a well-developed well-nourished gentleman in no distress.The head and neck exam reveals pupils equal and reactive.  Extraocular movements are full.  There is no scleral icterus.  The mouth and pharynx are normal.  The neck is supple.  The carotids reveal no bruits.  The jugular venous pressure is normal.  The  thyroid is not enlarged.  There is no lymphadenopathy.  The chest is clear to percussion and auscultation.  There are no rales or rhonchi.  Expansion of the chest is symmetrical.  The precordium is quiet.  The first heart sound is normal.  The second heart sound is physiologically split.  There is no murmur gallop rub or click.  There is no abnormal lift or heave.  The abdomen is soft and nontender.  The bowel sounds are normal.  The liver and spleen are not enlarged.  There are no abdominal masses.  There  are no abdominal bruits.  Extremities reveal good pedal pulses in the right foot.  There is a left BK amputation with a prosthesis. There is no phlebitis or edema.  There is no cyanosis or clubbing.  Strength is normal and symmetrical in all extremities.  There is no lateralizing weakness.  There are no sensory deficits.  The skin is warm and dry.  There is no rash.    Assessment / Plan: Continue same medication.  Lab work is pending. Recheck in one year for followup office visit and fasting lab work

## 2013-02-17 ENCOUNTER — Telehealth: Payer: Self-pay | Admitting: *Deleted

## 2013-02-17 NOTE — Telephone Encounter (Signed)
Mailed copy and highlighted comments at request of patient

## 2013-02-17 NOTE — Telephone Encounter (Signed)
Message copied by Burnell Blanks on Fri Feb 17, 2013 11:31 AM ------      Message from: Cassell Clement      Created: Thu Feb 16, 2013  7:24 PM       Please report to patient.  The recent labs are stable. Continue same medication and careful diet. LDL better. BS normal since his weight loss. ------

## 2013-02-20 ENCOUNTER — Other Ambulatory Visit: Payer: Self-pay | Admitting: Cardiology

## 2013-02-21 ENCOUNTER — Other Ambulatory Visit: Payer: Self-pay | Admitting: *Deleted

## 2013-02-21 DIAGNOSIS — F419 Anxiety disorder, unspecified: Secondary | ICD-10-CM

## 2013-02-21 MED ORDER — DIAZEPAM 5 MG PO TABS: 5.0000 mg | ORAL_TABLET | Freq: Three times a day (TID) | ORAL | Status: DC | PRN

## 2013-03-02 ENCOUNTER — Other Ambulatory Visit: Payer: Self-pay | Admitting: Cardiology

## 2013-03-21 ENCOUNTER — Telehealth: Payer: Self-pay | Admitting: *Deleted

## 2013-03-21 DIAGNOSIS — F419 Anxiety disorder, unspecified: Secondary | ICD-10-CM

## 2013-03-21 MED ORDER — DIAZEPAM 5 MG PO TABS: 5.0000 mg | ORAL_TABLET | Freq: Three times a day (TID) | ORAL | Status: DC | PRN

## 2013-03-21 NOTE — Telephone Encounter (Signed)
Called in refill for valium

## 2013-08-30 ENCOUNTER — Other Ambulatory Visit: Payer: Self-pay | Admitting: Cardiology

## 2013-09-19 ENCOUNTER — Other Ambulatory Visit: Payer: Self-pay | Admitting: Cardiology

## 2013-10-02 ENCOUNTER — Other Ambulatory Visit: Payer: Self-pay | Admitting: Cardiology

## 2013-10-04 ENCOUNTER — Other Ambulatory Visit: Payer: Self-pay | Admitting: Cardiology

## 2014-01-18 ENCOUNTER — Other Ambulatory Visit: Payer: Self-pay | Admitting: Cardiology

## 2014-01-18 DIAGNOSIS — F419 Anxiety disorder, unspecified: Secondary | ICD-10-CM

## 2014-01-31 ENCOUNTER — Other Ambulatory Visit: Payer: Self-pay | Admitting: Cardiology

## 2014-02-01 ENCOUNTER — Other Ambulatory Visit: Payer: Self-pay

## 2014-02-19 ENCOUNTER — Ambulatory Visit (INDEPENDENT_AMBULATORY_CARE_PROVIDER_SITE_OTHER): Payer: Medicare Other | Admitting: Cardiology

## 2014-02-19 ENCOUNTER — Other Ambulatory Visit (INDEPENDENT_AMBULATORY_CARE_PROVIDER_SITE_OTHER): Payer: Medicare Other | Admitting: *Deleted

## 2014-02-19 ENCOUNTER — Encounter: Payer: Self-pay | Admitting: Cardiology

## 2014-02-19 VITALS — BP 120/96 | HR 68 | Ht 72.0 in | Wt 190.0 lb

## 2014-02-19 DIAGNOSIS — E7800 Pure hypercholesterolemia, unspecified: Secondary | ICD-10-CM

## 2014-02-19 DIAGNOSIS — F419 Anxiety disorder, unspecified: Secondary | ICD-10-CM

## 2014-02-19 DIAGNOSIS — C419 Malignant neoplasm of bone and articular cartilage, unspecified: Secondary | ICD-10-CM

## 2014-02-19 LAB — LIPID PANEL
Cholesterol: 190 mg/dL (ref 0–200)
HDL: 50.3 mg/dL (ref >39.00)
LDL CALC: 122 mg/dL — AB (ref 0–99)
NONHDL: 139.7
Total CHOL/HDL Ratio: 4
Triglycerides: 91 mg/dL (ref 0.0–149.0)
VLDL: 18.2 mg/dL (ref 0.0–40.0)

## 2014-02-19 LAB — BASIC METABOLIC PANEL
BUN: 34 mg/dL — ABNORMAL HIGH (ref 6–23)
CALCIUM: 9.4 mg/dL (ref 8.4–10.5)
CO2: 27 meq/L (ref 19–32)
Chloride: 103 mEq/L (ref 96–112)
Creatinine, Ser: 1.2 mg/dL (ref 0.4–1.5)
GFR: 66.12 mL/min (ref >60.00)
Glucose, Bld: 94 mg/dL (ref 70–99)
Potassium: 4.1 mEq/L (ref 3.5–5.1)
SODIUM: 135 meq/L (ref 135–145)

## 2014-02-19 LAB — HEPATIC FUNCTION PANEL
ALBUMIN: 3.7 g/dL (ref 3.5–5.2)
ALT: 14 U/L (ref 0–53)
AST: 19 U/L (ref 0–37)
Alkaline Phosphatase: 54 U/L (ref 39–117)
Bilirubin, Direct: 0 mg/dL (ref 0.0–0.3)
Total Bilirubin: 0.4 mg/dL (ref 0.2–1.2)
Total Protein: 7.3 g/dL (ref 6.0–8.3)

## 2014-02-19 NOTE — Assessment & Plan Note (Signed)
The patient has a left BKA secondary to previous bone sarcoma.  He is hoping to get a new prosthesis which will have an articulating ankle joint.

## 2014-02-19 NOTE — Progress Notes (Signed)
Benjamin Valdez Date of Birth:  1960-05-24 Benjamin [place], [place]  [address] [phone]  Fax   [phone]  HPI: This pleasant 53 year old gentleman is seen for a one-year followup office visit. He has a history of hypercholesterolemia and a strong family history of coronary disease. He, himself does not have any known ischemic heart disease. He had a normal nuclear stress test in 2009. 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 He exercises regularly. He swims at the [facility]. Last year he swam a total of 823 miles and set the record for number of miles swum in one year.This year he has not been swimming as much but still swims frequently. He has a past history of some depression and is on amitriptyline and Paxil and occasional Valium.   Current Outpatient Prescriptions  Medication Sig Dispense Refill  . amitriptyline (ELAVIL) 50 MG tablet Take 50 mg by mouth at bedtime. Takes 2 tablets at times.    . CRESTOR 20 MG tablet TAKE ONE TABLET BY MOUTH ONE TIME DAILY  30 tablet 2  . diazepam (VALIUM) 5 MG tablet TAKE ONE TABLET BY MOUTH EVERY EIGHT HOURS AS NEEDED  100 tablet 3  . omeprazole (PRILOSEC) 40 MG capsule TAKE ONE CAPSULE BY MOUTH ONE TIME DAILY  30 capsule 3  . PARoxetine (PAXIL) 30 MG tablet Take 45 mg by mouth every morning.    . promethazine (PHENERGAN) 25 MG suppository Place 25 mg rectally every 6 (six) hours as needed for nausea.     No current facility-administered medications for this visit.    No Known Allergies  Patient Active Problem List   Diagnosis Date Noted  . Hypercholesterolemia 01/28/2011  . Bone sarcoma 01/28/2011    History  Smoking status  . Never Smoker   Smokeless tobacco  . Not on file    History  Alcohol Use No    No family history on file.  Review of Systems: The patient denies any heat or cold intolerance.  No weight gain or weight loss.  The patient  denies headaches or blurry vision.  There is no cough or sputum production.  The patient denies dizziness.  There is no hematuria or hematochezia.  The patient denies any muscle aches or arthritis.  The patient denies any rash.  The patient denies frequent falling or instability.  There is no history of depression or anxiety.  All other systems were reviewed and are negative.   Physical Exam: Filed Vitals:   02/19/14 0821  BP: 120/96  Pulse: 68  The patient appears to be in no distress.  Head and neck exam reveals that the pupils are equal and reactive.  The extraocular movements are full.  There is no scleral icterus.  Mouth and pharynx are benign.  No lymphadenopathy.  No carotid bruits.  The jugular venous pressure is normal.  Thyroid is not enlarged or tender.  Chest is clear to percussion and auscultation.  No rales or rhonchi.  Expansion of the chest is symmetrical.  Heart reveals no abnormal lift or heave.  First and second heart sounds are normal.  There is no murmur gallop rub or click.  The abdomen is soft and nontender.  Bowel sounds are normoactive.  There is no hepatosplenomegaly or mass.  There are no abdominal bruits.  Extremities reveal no phlebitis or edema.  Pedal pulses are good.  There is no cyanosis or  clubbing.  left BKA with prosthesis Neurologic exam is normal strength and no lateralizing weakness.  No sensory deficits.  Integument reveals no rash  EKG shows normal sinus rhythm and is within normal limits.   Assessment / Plan: 1. Hypercholesterolemia 2.  Family history of coronary disease 3.  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  History of depression.  A very close friend of his committed suicide this year 34.  Occasional episodes of sudden nausea and vomiting requiring emergency room visits  Disposition: Continue same medication.  Recheck in one year for follow-up office visit lipid panel hepatic function panel and basal  metabolic panel.

## 2014-02-19 NOTE — Patient Instructions (Signed)
Will obtain labs today and call you with the results (lp/bmet/hfp)  Your physician recommends that you continue on your current medications as directed. Please refer to the Current Medication list given to you today.  Your physician wants you to follow-up in: 1 year ov/lp/bmet/hfp You will receive a reminder letter in the mail two months in advance. If you don't receive a letter, please call our office to schedule the follow-up appointment.

## 2014-02-19 NOTE — Assessment & Plan Note (Signed)
The patient has a history of hypercholesterolemia.  He is on Crestor 20 mg daily.  He has not had any myalgias.  Lab work is pending today.

## 2014-02-20 NOTE — Progress Notes (Signed)
Quick Note:  Please report to patient. The recent labs are stable. Continue same medication and careful diet.the BUN is higher. Needs to drink more water. The LDL cholesterol is higher. Continue same medication but work harder on weight loss and increasing activity ______

## 2014-04-20 NOTE — Telephone Encounter (Signed)
Pt not seen at [facility]. Refill had been addressed Feb 2014. Cleaning out MD in basket.

## 2014-05-22 ENCOUNTER — Other Ambulatory Visit: Payer: Self-pay | Admitting: Cardiology

## 2014-05-22 DIAGNOSIS — R1013 Epigastric pain: Secondary | ICD-10-CM

## 2014-05-22 DIAGNOSIS — F419 Anxiety disorder, unspecified: Secondary | ICD-10-CM

## 2014-05-22 DIAGNOSIS — E7800 Pure hypercholesterolemia, unspecified: Secondary | ICD-10-CM
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
## 2014-08-28 ENCOUNTER — Other Ambulatory Visit: Payer: Self-pay | Admitting: *Deleted

## 2014-08-28 DIAGNOSIS — F419 Anxiety disorder, unspecified: Secondary | ICD-10-CM

## 2014-08-28 MED ORDER — DIAZEPAM 5 MG PO TABS: 5.0000 mg | ORAL_TABLET | Freq: Three times a day (TID) | ORAL | Status: DC | PRN

## 2014-12-03 ENCOUNTER — Other Ambulatory Visit: Payer: Self-pay | Admitting: Cardiology

## 2015-01-02 ENCOUNTER — Other Ambulatory Visit: Payer: Self-pay | Admitting: Cardiology

## 2015-01-02 DIAGNOSIS — F419 Anxiety disorder, unspecified: Secondary | ICD-10-CM

## 2015-01-14 NOTE — Telephone Encounter (Signed)
Okay to refill? 

## 2015-03-04 ENCOUNTER — Encounter: Payer: Self-pay | Admitting: Cardiology

## 2015-03-04 ENCOUNTER — Ambulatory Visit (INDEPENDENT_AMBULATORY_CARE_PROVIDER_SITE_OTHER): Payer: Medicare Other | Admitting: Cardiology

## 2015-03-04 VITALS — BP 106/60 | HR 82 | Ht 72.0 in | Wt 161.1 lb

## 2015-03-04 DIAGNOSIS — E7800 Pure hypercholesterolemia, unspecified: Secondary | ICD-10-CM

## 2015-03-04 DIAGNOSIS — F419 Anxiety disorder, unspecified: Secondary | ICD-10-CM | POA: Diagnosis not present

## 2015-03-04 DIAGNOSIS — C419 Malignant neoplasm of bone and articular cartilage, unspecified: Secondary | ICD-10-CM

## 2015-03-04 LAB — LIPID PANEL
Cholesterol: 197 mg/dL (ref 125–200)
HDL: 61 mg/dL (ref >=40)
LDL CALC: 125 mg/dL (ref <130)
TRIGLYCERIDES: 56 mg/dL (ref <150)
Total CHOL/HDL Ratio: 3.2 Ratio (ref <=5.0)
VLDL: 11 mg/dL (ref <30)

## 2015-03-04 LAB — BASIC METABOLIC PANEL
BUN: 29 mg/dL — AB (ref 7–25)
CO2: 28 mmol/L (ref 20–31)
CREATININE: 1.16 mg/dL (ref 0.70–1.33)
Calcium: 9.9 mg/dL (ref 8.6–10.3)
Chloride: 102 mmol/L (ref 98–110)
Glucose, Bld: 96 mg/dL (ref 65–99)
Potassium: 4.6 mmol/L (ref 3.5–5.3)
Sodium: 139 mmol/L (ref 135–146)

## 2015-03-04 LAB — HEPATIC FUNCTION PANEL
ALK PHOS: 60 U/L (ref 40–115)
ALT: 14 U/L (ref 9–46)
AST: 18 U/L (ref 10–35)
Albumin: 4.6 g/dL (ref 3.6–5.1)
BILIRUBIN INDIRECT: 0.4 mg/dL (ref 0.2–1.2)
Bilirubin, Direct: 0.1 mg/dL (ref <=0.2)
TOTAL PROTEIN: 7.3 g/dL (ref 6.1–8.1)
Total Bilirubin: 0.5 mg/dL (ref 0.2–1.2)

## 2015-03-04 NOTE — Progress Notes (Signed)
Cardiology Office Note   Date:  03/04/2015   ID:  Nancee Liter, DOB 1960/05/21, MRN [id number]  PCP:  Warren Danes, MD  Cardiologist: Darlin Coco MD  No chief complaint on file.     History of Present Illness: Derrian Huettner is a 54 y.o. male who presents for a one-year follow-up visit   He has a history of hypercholesterolemia and a strong family history of coronary disease. He, himself does not have any known ischemic heart disease. He had a normal nuclear stress test in 2009. 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 He exercises regularly. He swims at the [facility]. He is on course 2 break his own swimming record.  By mid-December he will have swan 1000 miles in the past 12 months.  He states that his body fat is down to 10%.  He has lost 29 pounds since a year ago.  He is not having any chest pain or shortness of breath.  No palpitations dizziness or syncope.  Past Medical History  Diagnosis Date  . Bone cancer ([place])     has left BK amputation  . Neck problem   . Nausea alone     Recurrent  . 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 Vomiting   . Chest pain April 2010    Myocardial Infarction ruled out  . Gastritis April 2010    Acute--Resolved  . Essential hypertension   . Dyslipidemia   . Hiatal hernia   . Marijuana abuse     History of Marijuana use    Past Surgical History  Procedure Laterality Date  . Appendectomy    . Amputation      Left leg below the knee   . Cervical laminectomy       Current Outpatient Prescriptions  Medication Sig Dispense Refill  . amitriptyline (ELAVIL) 50 MG tablet Take by mouth at bedtime. Takes 50-100mg  (1-2tablets) by mouth at bedtime as needed for sleep.    . CRESTOR 20 MG tablet TAKE ONE TABLET BY MOUTH ONE TIME DAILY 30 tablet 3  . diazepam (VALIUM) 5 MG tablet Take 5 mg by mouth every 8 (eight) hours as needed for anxiety (anxiety).    [name] Kitchen omeprazole (PRILOSEC) 40 MG  capsule TAKE ONE CAPSULE BY MOUTH ONE TIME DAILY 30 capsule 3  . PARoxetine (PAXIL) 30 MG tablet Take 45 mg by mouth every morning.    . promethazine (PHENERGAN) 25 MG suppository Place 25 mg rectally every 6 (six) hours as needed for nausea.     No current facility-administered medications for this visit.    Allergies:   Review of patient's allergies indicates no known allergies.    Social History:  The patient  reports that he has never smoked. He does not have any smokeless tobacco history on file. He reports that he does not drink alcohol.   Family History:  The patient's family history is not on file. the patient's father and mother have both had coronary artery disease.  ROS:  Please see the history of present illness.   Otherwise, review of systems are positive for none.   All other systems are reviewed and negative.    PHYSICAL EXAM: VS:  BP 106/60 mmHg  Pulse 82  Ht 6' (1.829 m)  Wt 161 lb 1.9 oz (73.084 kg)  BMI 21.85 kg/m2 , BMI Body mass index is 21.85 kg/(m^2). GEN: Well nourished, well developed,  in no acute distress HEENT: normal Neck: no JVD, carotid bruits, or masses Cardiac: RRR; no murmurs, rubs, or gallops,no edema  Respiratory:  clear to auscultation bilaterally, normal work of breathing GI: soft, nontender, nondistended, + BS MS: no deformity or atrophy Skin: warm and dry, no rash Neuro:  Strength and sensation are intact Psych: euthymic mood, full affect   EKG:  EKG is ordered today. The ekg ordered today demonstrates normal sinus rhythm.  Within normal limits.   Recent Labs: No results found for requested labs within last 365 days.    Lipid Panel    Component Value Date/Time   CHOL 190 02/19/2014 0908   TRIG 91.0 02/19/2014 0908   HDL 50.30 02/19/2014 0908   CHOLHDL 4 02/19/2014 0908   VLDL 18.2 02/19/2014 0908   LDLCALC 122* 02/19/2014 0908      Wt Readings from Last 3 Encounters:  03/04/15 161 lb 1.9 oz (73.084 kg)  02/19/14 190 lb  (86.183 kg)  02/16/13 172 lb (78.019 kg)        ASSESSMENT AND PLAN:  1. Hypercholesterolemia 2. Family history of coronary disease 3. 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 History of depression. 5. Occasional episodes of sudden nausea and vomiting requiring emergency room visits   Current medicines are reviewed at length with the patient today.  The patient does not have concerns regarding medicines.  The following changes have been made:  no change  Labs/ tests ordered today include:   Orders Placed This Encounter  Procedures  . Lipid panel  . Hepatic function panel  . Basic metabolic panel  . EKG 12-Lead    Disposition: We are checking fasting lipid panel and chemistries today.  Following my retirement he will follow-up with a primary care physician of his choosing.  Return here when necessary  Signed, Darlin Coco MD 03/04/2015 10:17 AM    [address], [place], [place]  [id number] Phone: [phone]; Fax: [phone]

## 2015-03-04 NOTE — Patient Instructions (Addendum)
Medication Instructions:  Your physician recommends that you continue on your current medications as directed. Please refer to the Current Medication list given to you today.  Labwork: Lp/bmet/hfp  Testing/Procedures: none  Follow-Up: As needed   If you need a refill on your cardiac medications before your next appointment, please call your pharmacy.

## 2015-03-05 NOTE — Progress Notes (Signed)
Quick Note:  Please report to patient. The recent labs are stable. Continue same medication and careful diet. LDL is 125 slightly higher than last year. Watch diet carefully. CSD. ______

## 2015-03-09 ENCOUNTER — Other Ambulatory Visit: Payer: Self-pay | Admitting: Cardiology

## 2015-03-09 DIAGNOSIS — F419 Anxiety disorder, unspecified: Secondary | ICD-10-CM

## 2015-03-19 ENCOUNTER — Other Ambulatory Visit: Payer: Self-pay | Admitting: Cardiology

## 2015-03-28 ENCOUNTER — Other Ambulatory Visit: Payer: Self-pay | Admitting: Cardiology

## 2015-05-16 ENCOUNTER — Other Ambulatory Visit: Payer: Self-pay | Admitting: Cardiology

## 2015-05-16 DIAGNOSIS — F419 Anxiety disorder, unspecified: Secondary | ICD-10-CM

## 2015-05-21 NOTE — Telephone Encounter (Signed)
Pt says he still have not received his Diazepam.

## 2015-05-21 NOTE — Telephone Encounter (Signed)
Pt is calling again requesting a refill on diazepam. Please advise

## 2015-05-22 ENCOUNTER — Telehealth: Payer: Self-pay | Admitting: Cardiology

## 2015-05-22 NOTE — Telephone Encounter (Signed)
Left message to call back  

## 2015-05-22 NOTE — Telephone Encounter (Signed)
Spoke with patient and advised Rx called to pharmacy but additional refills from PCP, patient aware

## 2015-05-22 NOTE — Telephone Encounter (Signed)
New message      *STAT* If patient is at the pharmacy, call can be transferred to refill team.   1. Which medications need to be refilled? (please list name of each medication and dose if known) diazepam 5mg  2. Which pharmacy/location (including street and city if local pharmacy) is medication to be sent to? [facility] [address]. Do they need a 30 day or 90 day supply? 100 pills

## 2015-06-07 ENCOUNTER — Telehealth: Payer: Self-pay | Admitting: Cardiology

## 2015-06-07 NOTE — Telephone Encounter (Signed)
He wants to know if you have faxed his records to his new doctor,they have not received them.

## 2015-06-07 NOTE — Telephone Encounter (Signed)
Faxed records to Dr Verlene Mayer office as requested  Patient aware

## 2015-07-22 ENCOUNTER — Emergency Department (HOSPITAL_COMMUNITY): Payer: Medicare Other

## 2015-07-22 ENCOUNTER — Emergency Department (HOSPITAL_COMMUNITY)
Admission: EM | Admit: 2015-07-22 | Discharge: 2015-07-22 | Disposition: A | Discharge status: Home / Self Care | Payer: Medicare Other | Attending: Emergency Medicine | Admitting: Emergency Medicine

## 2015-07-22 ENCOUNTER — Encounter (HOSPITAL_COMMUNITY): Payer: Self-pay | Admitting: Emergency Medicine

## 2015-07-22 ENCOUNTER — Other Ambulatory Visit: Payer: Self-pay | Admitting: Internal Medicine

## 2015-07-22 DIAGNOSIS — Z0000 Encounter for general adult medical examination without abnormal findings: Secondary | ICD-10-CM

## 2015-07-22 DIAGNOSIS — Z79899 Other long term (current) drug therapy: Secondary | ICD-10-CM | POA: Insufficient documentation

## 2015-07-22 DIAGNOSIS — Z8589 Personal history of malignant neoplasm of other organs and systems: Secondary | ICD-10-CM | POA: Diagnosis not present

## 2015-07-22 DIAGNOSIS — Z89512 Acquired absence of left leg below knee: Secondary | ICD-10-CM | POA: Diagnosis not present

## 2015-07-22 DIAGNOSIS — Z85830 Personal history of malignant neoplasm of bone: Secondary | ICD-10-CM | POA: Diagnosis not present

## 2015-07-22 DIAGNOSIS — R0789 Other chest pain: Secondary | ICD-10-CM | POA: Diagnosis not present

## 2015-07-22 DIAGNOSIS — Z125 Encounter for screening for malignant neoplasm of prostate: Secondary | ICD-10-CM

## 2015-07-22 DIAGNOSIS — I10 Essential (primary) hypertension: Secondary | ICD-10-CM | POA: Insufficient documentation

## 2015-07-22 DIAGNOSIS — Z8719 Personal history of other diseases of the digestive system: Secondary | ICD-10-CM | POA: Insufficient documentation

## 2015-07-22 DIAGNOSIS — Z130 Encounter for screening for diseases of the blood and blood-forming organs and certain disorders involving the immune mechanism: Secondary | ICD-10-CM

## 2015-07-22 DIAGNOSIS — E785 Hyperlipidemia, unspecified: Secondary | ICD-10-CM

## 2015-07-22 DIAGNOSIS — M791 Myalgia: Secondary | ICD-10-CM | POA: Diagnosis present

## 2015-07-22 LAB — CBC WITH DIFFERENTIAL/PLATELET
BASOS PCT: 1 %
Basophils Absolute: 52 cells/uL (ref 0–200)
EOS ABS: 260 {cells}/uL (ref 15–500)
EOS PCT: 5 %
HCT: 43.1 % (ref 38.5–50.0)
Hemoglobin: 14.5 g/dL (ref 13.2–17.1)
LYMPHS PCT: 23 %
Lymphs Abs: 1196 cells/uL (ref 850–3900)
MCH: 30.5 pg (ref 27.0–33.0)
MCHC: 33.6 g/dL (ref 32.0–36.0)
MCV: 90.5 fL (ref 80.0–100.0)
MONOS PCT: 7 %
MPV: 10.4 fL (ref 7.5–12.5)
Monocytes Absolute: 364 cells/uL (ref 200–950)
NEUTROS ABS: 3328 {cells}/uL (ref 1500–7800)
Neutrophils Relative %: 64 %
PLATELETS: 211 10*3/uL (ref 140–400)
RBC: 4.76 MIL/uL (ref 4.20–5.80)
RDW: 13.6 % (ref 11.0–15.0)
WBC: 5.2 10*3/uL (ref 3.8–10.8)

## 2015-07-22 LAB — LIPID PANEL
Cholesterol: 193 mg/dL (ref 125–200)
HDL: 57 mg/dL (ref >=40)
LDL CALC: 119 mg/dL (ref <130)
Total CHOL/HDL Ratio: 3.4 Ratio (ref <=5.0)
Triglycerides: 83 mg/dL (ref <150)
VLDL: 17 mg/dL (ref <30)

## 2015-07-22 LAB — COMPLETE METABOLIC PANEL WITH GFR
ALT: 15 U/L (ref 9–46)
AST: 16 U/L (ref 10–35)
Albumin: 4.2 g/dL (ref 3.6–5.1)
Alkaline Phosphatase: 61 U/L (ref 40–115)
BILIRUBIN TOTAL: 0.3 mg/dL (ref 0.2–1.2)
BUN: 38 mg/dL — ABNORMAL HIGH (ref 7–25)
CHLORIDE: 103 mmol/L (ref 98–110)
CO2: 28 mmol/L (ref 20–31)
CREATININE: 1.05 mg/dL (ref 0.70–1.33)
Calcium: 9.5 mg/dL (ref 8.6–10.3)
GFR, Est Non African American: 80 mL/min (ref >=60)
GLUCOSE: 102 mg/dL — AB (ref 65–99)
Potassium: 4.9 mmol/L (ref 3.5–5.3)
SODIUM: 140 mmol/L (ref 135–146)
TOTAL PROTEIN: 6.5 g/dL (ref 6.1–8.1)

## 2015-07-22 MED ORDER — HYDROCODONE-ACETAMINOPHEN 5-325 MG PO TABS
2.0000 | ORAL_TABLET | Freq: Once | ORAL | Status: AC
  Administered 2015-07-22: 2 via ORAL
  Filled 2015-07-22: qty 2

## 2015-07-22 MED ORDER — KETOROLAC TROMETHAMINE 60 MG/2ML IM SOLN
60.0000 mg | Freq: Once | INTRAMUSCULAR | Status: AC
  Administered 2015-07-22: 60 mg via INTRAMUSCULAR
  Filled 2015-07-22: qty 2

## 2015-07-22 MED ORDER — IBUPROFEN 800 MG PO TABS: 800.0000 mg | ORAL_TABLET | Freq: Three times a day (TID) | ORAL | Status: DC

## 2015-07-22 MED ORDER — DEXAMETHASONE SODIUM PHOSPHATE 10 MG/ML IJ SOLN
10.0000 mg | Freq: Once | INTRAMUSCULAR | Status: AC
  Administered 2015-07-22: 10 mg via INTRAMUSCULAR
  Filled 2015-07-22: qty 1

## 2015-07-22 MED ORDER — HYDROCODONE-ACETAMINOPHEN 5-325 MG PO TABS: 1.0000 | ORAL_TABLET | Freq: Four times a day (QID) | ORAL | Status: DC | PRN

## 2015-07-22 NOTE — ED Notes (Signed)
Pt c/o L side pain. Pt has hx of angio sarcoma and is a BKA. [facility]." Pt sts he has had progressively worsening pain in his side since then. A&Ox4 and ambulatory. Denies blunt force injury. Pain worsens with movement and breathing. A&Ox4 and ambulatory.

## 2015-07-22 NOTE — ED Provider Notes (Signed)
CSN: [id number]     Arrival date & time 07/22/15  1302 History  By signing my name below, I, Rayna Sexton, attest that this documentation has been prepared under the direction and in the presence of Delsa Grana, PA-C. Electronically Signed: Rayna Sexton, ED Scribe. 07/22/2015. 4:24 PM.   Chief Complaint  Patient presents with  . Muscle Pain   The history is provided by the patient. No language interpreter was used.    HPI Comments: Benjamin Valdez is a 55 y.o. male with a PMHx of angio sarcoma 20 years ago and a SHx including left BKA who presents to the Emergency Department complaining of diffuse, worsening, 8/10, left side pain onset 2 days ago that began after working out his torso on a weightlifting machine.  He states that his soreness has gradually worsened over the past two days and is worsened with movement, palpation and with deep breaths.  Yesterday he went swimming and the activity alleviated his pain slightly with it then worsening over the past day. Pain is also improved when sitting and with Aleve.  Pt denies SOB, swelling, redness, cough, fever, chills, wheezing or any other associated symptoms at this time.    Past Medical History  Diagnosis Date  . Bone cancer ([place])     has left BK amputation  . Neck problem   . Nausea alone     Recurrent  . 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 Vomiting   . Chest pain April 2010    Myocardial Infarction ruled out  . Gastritis April 2010    Acute--Resolved  . Essential hypertension   . Dyslipidemia   . Hiatal hernia   . Marijuana abuse     History of Marijuana use   Past Surgical History  Procedure Laterality Date  . Appendectomy    . Amputation      Left leg below the knee   . Cervical laminectomy     No family history on file. Social History  Substance Use Topics  . Smoking status: Never Smoker   . Smokeless tobacco: None  . Alcohol Use: No    Review of Systems  Constitutional: Negative for fever, chills,  diaphoresis and fatigue.  HENT: Negative.   Respiratory: Negative.   Cardiovascular: Negative for palpitations and leg swelling.  Gastrointestinal: Negative.   Genitourinary: Negative.   Skin: Negative for color change and pallor.  Neurological: Negative.   Psychiatric/Behavioral: Negative.    A complete 10 system review of systems was obtained and all systems are negative except as noted in the HPI and PMH.   Allergies  Review of patient's allergies indicates no known allergies.  Home Medications   Prior to Admission medications   Medication Sig Start Date End Date Taking? Authorizing Provider  amitriptyline (ELAVIL) 50 MG tablet Take by mouth at bedtime. Takes 50-100mg  (1-2tablets) by mouth at bedtime as needed for sleep.    Historical Provider, MD  diazepam (VALIUM) 5 MG tablet Take 1 tablet (5 mg total) by mouth every 8 (eight) hours as needed for anxiety (additional refills from pcp). 05/23/15   Darlin Coco, MD  HYDROcodone-acetaminophen (NORCO/VICODIN) 5-325 MG tablet Take 1-2 tablets by mouth every 6 (six) hours as needed for severe pain. 07/22/15   Delsa Grana, PA-C  ibuprofen (ADVIL,MOTRIN) 800 MG tablet Take 1 tablet (800 mg total) by mouth 3 (three) times daily. 07/22/15   Delsa Grana, PA-C  omeprazole (PRILOSEC) 40 MG capsule TAKE ONE CAPSULE BY MOUTH  ONE TIME DAILY 03/19/15   Darlin Coco, MD  PARoxetine (PAXIL) 30 MG tablet Take 45 mg by mouth every morning.    Historical Provider, MD  promethazine (PHENERGAN) 25 MG suppository Place 25 mg rectally every 6 (six) hours as needed for nausea.    Historical Provider, MD  rosuvastatin (CRESTOR) 20 MG tablet TAKE ONE TABLET BY MOUTH ONE TIME DAILY 03/28/15   Darlin Coco, MD   BP 143/93 mmHg  Pulse 68  Temp(Src) 98.1 F (36.7 C) (Oral)  Resp 16  SpO2 98% Physical Exam  Constitutional: He is oriented to person, place, and time. He appears well-developed and well-nourished. No distress.  HENT:  Head: Normocephalic and  atraumatic.  Nose: Nose normal.  Mouth/Throat: Oropharynx is clear and moist. No oropharyngeal exudate.  Eyes: Conjunctivae and EOM are normal. Pupils are equal, round, and reactive to light. Right eye exhibits no discharge. Left eye exhibits no discharge. No scleral icterus.  Neck: Normal range of motion. No JVD present. No tracheal deviation present. No thyromegaly present.  Cardiovascular: Normal rate, regular rhythm, normal heart sounds and intact distal pulses.  Exam reveals no gallop and no friction rub.   No murmur heard. Pulmonary/Chest: Effort normal and breath sounds normal. No accessory muscle usage. No tachypnea. No respiratory distress. He has no decreased breath sounds. He has no wheezes. He has no rhonchi. He has no rales.   Chest wall is not dull to percussion. He exhibits tenderness and bony tenderness. He exhibits no crepitus, no edema, no deformity, no swelling and no retraction.    Abdominal: Soft. Bowel sounds are normal. He exhibits no distension and no mass. There is no tenderness. There is no rebound and no guarding.  Musculoskeletal: Normal range of motion. He exhibits no edema or tenderness.  Lymphadenopathy:    He has no cervical adenopathy.  Neurological: He is alert and oriented to person, place, and time. He has normal reflexes. No cranial nerve deficit. He exhibits normal muscle tone. Coordination normal.  Skin: Skin is warm and dry. No rash noted. He is not diaphoretic. No erythema. No pallor.  Psychiatric: He has a normal mood and affect. His behavior is normal. Judgment and thought content normal.  Nursing note and vitals reviewed.  ED Course  Procedures  DIAGNOSTIC STUDIES: Oxygen Saturation is 98% on RA, normal by my interpretation.    COORDINATION OF CARE: 4:16 PM Discussed next steps with pt. He verbalized understanding and is agreeable with the plan.   Labs Review Labs Reviewed - No data to display  Imaging Review Dg Chest 2 View  07/22/2015   CLINICAL DATA:  Left rib pain after stretching injury. EXAM: CHEST  2 VIEW COMPARISON:  None. FINDINGS: Normal heart size. Normal mediastinal contour. No pneumothorax. No pleural effusion. Minimal scarring versus atelectasis in the medial left lung base. No pulmonary edema. No acute consolidative airspace disease. No displaced fracture. IMPRESSION: Minimal scarring versus atelectasis at the left lung base. Otherwise no active disease in the chest. Electronically Signed   By: Ilona Sorrel M.D.   On: 07/22/2015 16:37   I have personally reviewed and evaluated these images and lab results as part of my medical decision-making.   EKG Interpretation None      MDM   Pt with left sided rib pain s/p working out torso Pain is reproducible with movement, palpation and also with deep inspiration.  No concern for ACS given hx. CXR negative for rib fx, pneumothorax, chest wall abnormality, no effusion, edema  or infiltrate.  LLL base pertinent for minimal scarring vs atelectasis - though I favor atelectasis given pt's hesitancy to take a deep breath secondary to pain.   EKG obtained per Dr. Keturah Shavers request,EKG NSR. Pt will be discharged home after toradol and decadron injection, for likely inflammation.  Pt encouraged to continue NSAIDs, follow up closely with PCP if not improving, given hx.  Pt was in agreement with plan.  D/C'd home in good condition.  Final diagnoses:  Left-sided chest wall pain   I personally performed the services described in this documentation, which was scribed in my presence. The recorded information has been reviewed and is accurate.     Delsa Grana, PA-C 07/23/15 0129  Forde Dandy, MD 07/23/15 [id number]

## 2015-07-22 NOTE — Discharge Instructions (Signed)
Chest Wall Pain Chest wall pain is pain in or around the bones and muscles of your chest. Sometimes, an injury causes this pain. Sometimes, the cause may not be known. This pain may take several weeks or longer to get better. HOME CARE INSTRUCTIONS  Pay attention to any changes in your symptoms. Take these actions to help with your pain:   Rest as told by your health care provider.   Avoid activities that cause pain. These include any activities that use your chest muscles or your abdominal and side muscles to lift heavy items.   If directed, apply ice to the painful area:  Put ice in a plastic bag.  Place a towel between your skin and the bag.  Leave the ice on for 20 minutes, 2-3 times per day.  Take over-the-counter and prescription medicines only as told by your health care provider.  Do not use tobacco products, including cigarettes, chewing tobacco, and e-cigarettes. If you need help quitting, ask your health care provider.  Keep all follow-up visits as told by your health care provider. This is important. SEEK MEDICAL CARE IF:  You have a fever.  Your chest pain becomes worse.  You have new symptoms. SEEK IMMEDIATE MEDICAL CARE IF:  You have nausea or vomiting.  You feel sweaty or light-headed.  You have a cough with phlegm (sputum) or you cough up blood.  You develop shortness of breath.   This information is not intended to replace advice given to you by your health care provider. Make sure you discuss any questions you have with your health care provider.   Document Released: 04/06/2005 Document Revised: 12/26/2014 Document Reviewed: 07/02/2014 Elsevier Interactive Patient Education 2016 [employer] Costochondritis Costochondritis, sometimes called Tietze syndrome, is a swelling and irritation (inflammation) of the tissue (cartilage) that connects your ribs with your breastbone (sternum). It causes pain in the chest and rib area. Costochondritis usually  goes away on its own over time. It can take up to 6 weeks or longer to get better, especially if you are unable to limit your activities. CAUSES  Some cases of costochondritis have no known cause. Possible causes include:  Injury (trauma).  Exercise or activity such as lifting.  Severe coughing. SIGNS AND SYMPTOMS  Pain and tenderness in the chest and rib area.  Pain that gets worse when coughing or taking deep breaths.  Pain that gets worse with specific movements. DIAGNOSIS  Your health care provider will do a physical exam and ask about your symptoms. Chest X-rays or other tests may be done to rule out other problems. TREATMENT  Costochondritis usually goes away on its own over time. Your health care provider may prescribe medicine to help relieve pain. HOME CARE INSTRUCTIONS   Avoid exhausting physical activity. Try not to strain your ribs during normal activity. This would include any activities using chest, abdominal, and side muscles, especially if heavy weights are used.  Apply ice to the affected area for the first 2 days after the pain begins.  Put ice in a plastic bag.  Place a towel between your skin and the bag.  Leave the ice on for 20 minutes, 2-3 times a day.  Only take over-the-counter or prescription medicines as directed by your health care provider. SEEK MEDICAL CARE IF:  You have redness or swelling at the rib joints. These are signs of infection.  Your pain does not go away despite rest or medicine. SEEK IMMEDIATE MEDICAL CARE IF:   Your pain increases  or you are very uncomfortable.  You have shortness of breath or difficulty breathing.  You cough up blood.  You have worse chest pains, sweating, or vomiting.  You have a fever or persistent symptoms for more than 2-3 days.  You have a fever and your symptoms suddenly get worse. MAKE SURE YOU:   Understand these instructions.  Will watch your condition.  Will get help right away if you are  not doing well or get worse.   This information is not intended to replace advice given to you by your health care provider. Make sure you discuss any questions you have with your health care provider.   Document Released: 01/14/2005 Document Revised: 01/25/2013 Document Reviewed: 11/08/2012 Elsevier Interactive Patient Education [employer].

## 2015-07-23 LAB — PSA: PSA: 0.84 ng/mL (ref <=4.00)

## 2015-07-29 ENCOUNTER — Encounter: Payer: Self-pay | Admitting: Internal Medicine

## 2015-07-29 ENCOUNTER — Ambulatory Visit (INDEPENDENT_AMBULATORY_CARE_PROVIDER_SITE_OTHER): Payer: Medicare Other | Admitting: Internal Medicine

## 2015-07-29 VITALS — BP 114/82 | HR 82 | Temp 97.2°F | Resp 20 | Ht 72.0 in | Wt 175.0 lb

## 2015-07-29 DIAGNOSIS — Z23 Encounter for immunization: Secondary | ICD-10-CM | POA: Diagnosis not present

## 2015-07-29 DIAGNOSIS — R1115 Cyclical vomiting syndrome unrelated to migraine: Secondary | ICD-10-CM

## 2015-07-29 DIAGNOSIS — Z85831 Personal history of malignant neoplasm of soft tissue: Secondary | ICD-10-CM | POA: Diagnosis not present

## 2015-07-29 DIAGNOSIS — M12819 Other specific arthropathies, not elsewhere classified, unspecified shoulder: Secondary | ICD-10-CM

## 2015-07-29 DIAGNOSIS — E785 Hyperlipidemia, unspecified: Secondary | ICD-10-CM | POA: Diagnosis not present

## 2015-07-29 DIAGNOSIS — G43A0 Cyclical vomiting, not intractable: Secondary | ICD-10-CM

## 2015-07-29 DIAGNOSIS — Z8659 Personal history of other mental and behavioral disorders: Secondary | ICD-10-CM

## 2015-07-29 DIAGNOSIS — Z89522 Acquired absence of left knee: Secondary | ICD-10-CM | POA: Diagnosis not present

## 2015-07-29 DIAGNOSIS — Z0000 Encounter for general adult medical examination without abnormal findings: Secondary | ICD-10-CM | POA: Diagnosis not present

## 2015-07-29 DIAGNOSIS — M19012 Primary osteoarthritis, left shoulder: Secondary | ICD-10-CM

## 2015-07-29 DIAGNOSIS — Z89512 Acquired absence of left leg below knee: Secondary | ICD-10-CM

## 2015-07-29 LAB — POCT URINALYSIS DIPSTICK
BILIRUBIN UA: NEGATIVE
GLUCOSE UA: NEGATIVE
KETONES UA: NEGATIVE
Leukocytes, UA: NEGATIVE
Nitrite, UA: NEGATIVE
Protein, UA: NEGATIVE
RBC UA: NEGATIVE
SPEC GRAV UA: 1.01
Urobilinogen, UA: 0.2
pH, UA: 7

## 2015-07-29 NOTE — Progress Notes (Signed)
Subjective:    Patient ID: Benjamin Valdez, male    DOB: 1960/05/29, 55 y.o.   MRN: [id number]  HPI Pleasant 55 year old Male in today for health maintenance exam. His primary care doctor , Dr. Mare Ferrari, recently retired. Patient has a history of hyperlipidemia and has been on Crestor. His lipid panel and liver functions are within normal limits. Patient had a normal nuclear stress test in 2009. He exercises at [facility] with swimming.   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 History of cyclical vomiting treated with Phenergan amitriptyline. Last had episode about a year ago. History of fractured clavicle.  No known drug allergies He has left lower extremity prosthesis and ambulates very well.  Social history: Lives with parents. Never married. Does not smoke. No alcohol consumption. 4 year college degree.  Family history: One brother with Crohn's disease. Mother with osteoporosis , hyperlipidemia atrial fib on chronic anticoagulation, hip fracture, femur fracture, mitral regurgitation, history of MI with stent placement, controlled type 2 diabetes mellitus who is 23 years old. Father, age 50, with history of chronic back pain from lumbar stenosis, bladder cancer, diabetes mellitus, coronary stent placement, hyperlipidemia, hypertension, COPD and obstructive sleep apnea. One sister age 84 in good health.  Past medical history: Left BKA, appendectomy, cervical laminectomy  Past history of some depression treated with SSRI.  Takes omeprazole for GE reflux.  Colonoscopy January 2014 by [facility] endoscopy  He is seen annually by Dr. Montez Morita at [facility] regarding history of angiosarcoma left lower extremity  Head MRI of left shoulder in 2012 showing probable labral tear, significant AC joint degenerative changes, cystic changes and humeral head      Review of Systems occasional episodes of cyclical vomiting the last being a year ago. History of  internal and external hemorrhoids on colonoscopy     Objective:   Physical Exam  Constitutional: He is oriented to person, place, and time. He appears well-developed and well-nourished. No distress.  HENT:  Head: Normocephalic and atraumatic.  Right Ear: External ear normal.  Left Ear: External ear normal.  Eyes: Conjunctivae are normal. Pupils are equal, round, and reactive to light. Right eye exhibits no discharge. Left eye exhibits no discharge.  Neck: Neck supple. No JVD present. No thyromegaly present.  Cardiovascular: Normal rate, regular rhythm and normal heart sounds.   No murmur heard. Pulmonary/Chest: Effort normal and breath sounds normal. No respiratory distress. He has no wheezes. He has no rales.  Abdominal: Soft. Bowel sounds are normal. He exhibits no distension and no mass. There is no rebound and no guarding.  Genitourinary: Prostate normal.  Musculoskeletal: He exhibits no edema.  Left below the knee amputation  Neurological: He is alert and oriented to person, place, and time. No cranial nerve deficit.  Skin: Skin is warm and dry. No rash noted. He is not diaphoretic.  Psychiatric: He has a normal mood and affect. His behavior is normal. Judgment and thought content normal.  Vitals reviewed.         Assessment & Plan:  Normal health maintenance exam  Sarcoma left lower leg status post amputation-followed at [facility]  History of cyclical vomiting-no episode in about a year  Plan: Continue current medications as previously prescribed. Tetanus immunization update given. Return in one year or as needed. Lab work reviewed with him and is entirely within normal limits. His glucose is 102. We will add and hemoglobin A1c as his mother has history of diabetes.  Subjective:   Patient presents for Medicare Annual/Subsequent preventive examination.  Review Past Medical/Family/Social:   Risk Factors  Current exercise habits: swimming, weight training Dietary  issues discussed: low fat low carb  Cardiac risk factors: Family History, hyperlipidemia  Depression Screen  (Note: if answer to either of the following is "Yes", a more complete depression screening is indicated)   Over the past two weeks, have you felt down, depressed or hopeless? No  Over the past two weeks, have you felt little interest or pleasure in doing things? No Have you lost interest or pleasure in daily life? No Do you often feel hopeless? No Do you cry easily over simple problems? No   Activities of Daily Living  In your present state of health, do you have any difficulty performing the following activities?:   Driving? No  Managing money? No  Feeding yourself? No  Getting from bed to chair? No  Climbing a flight of stairs? No  Preparing food and eating?: No  Bathing or showering? No  Getting dressed: No  Getting to the toilet? No  Using the toilet:No  Moving around from place to place: No  In the past year have you fallen or had a near fall?:No  Are you sexually active? yes Do you have more than one partner? No one at a time  Hearing Difficulties: No  Do you often ask people to speak up or repeat themselves? No  Do you experience ringing or noises in your ears? No  Do you have difficulty understanding soft or whispered voices? No  Do you feel that you have a problem with memory? No Do you often misplace items? No    Home Safety:  Do you have a smoke alarm at your residence? Yes Do you have grab bars in the bathroom? yes Do you have throw rugs in your house? no   Cognitive Testing  Alert? Yes Normal Appearance?Yes  Oriented to person? Yes Place? Yes  Time? Yes  Recall of three objects? Yes  Can perform simple calculations? Yes  Displays appropriate judgment?Yes  Can read the correct time from a watch face?Yes   List the Names of Other Physician/Practitioners you currently use:  See referral list for the physicians patient is currently seeing.    Orthopedist at [place] annually   Review of Systems: See above   Objective:     General appearance: Appears younger than stated age Head: Normocephalic, without obvious abnormality, atraumatic  Eyes: conj clear, EOMi PEERLA  Ears: normal TM's and external ear canals both ears  Nose: Nares normal. Septum midline. Mucosa normal. No drainage or sinus tenderness.  Throat: lips, mucosa, and tongue normal; teeth and gums normal  Neck: no adenopathy, no carotid bruit, no JVD, supple, symmetrical, trachea midline and thyroid not enlarged, symmetric, no tenderness/mass/nodules  No CVA tenderness.  Lungs: clear to auscultation bilaterally  Breasts: normal appearance, no masses or tenderness Heart: regular rate and rhythm, S1, S2 normal, no murmur, click, rub or gallop  Abdomen: soft, non-tender; bowel sounds normal; no masses, no organomegaly  Musculoskeletal: Left BKA with prosthesis Skin: Skin color, texture, turgor normal. No rashes or lesions  Lymph nodes: Cervical, supraclavicular, and axillary nodes normal.  Neurologic: CN 2 -12 Normal, Normal symmetric reflexes. Normal coordination and gait  Psych: Alert & Oriented x 3, Mood appear stable.    Assessment:    Annual wellness medicare exam   Plan:    During the course of the visit the patient was educated  and counseled about appropriate screening and preventive services including:  Annual flu vaccine  Tetanus immunization update      Patient Instructions (the written plan) was given to the patient.  Medicare Attestation  I have personally reviewed:  The patient's medical and social history  Their use of alcohol, tobacco or illicit drugs  Their current medications and supplements  The patient's functional ability including ADLs,fall risks, home safety risks, cognitive, and hearing and visual impairment  Diet and physical activities  Evidence for depression or mood disorders  The patient's weight, height, BMI, and  visual acuity have been recorded in the chart. I have made referrals, counseling, and provided education to the patient based on review of the above and I have provided the patient with a written personalized care plan for preventive services.

## 2015-07-29 NOTE — Patient Instructions (Signed)
It was pleasure to see you today. Please continue current medications as previously prescribed. Return in one year or as needed. Tetanus immunization update given today.

## 2015-08-13 ENCOUNTER — Other Ambulatory Visit: Payer: Self-pay | Admitting: Internal Medicine

## 2015-08-13 NOTE — Telephone Encounter (Signed)
Refill once 

## 2015-09-12 ENCOUNTER — Telehealth: Payer: Self-pay | Admitting: Internal Medicine

## 2015-09-12 MED ORDER — DIAZEPAM 5 MG PO TABS: 5.0000 mg | ORAL_TABLET | Freq: Three times a day (TID) | ORAL | Status: DC | PRN

## 2015-09-12 NOTE — Telephone Encounter (Signed)
Patient calling for a refill on his Diazepam 5mg .    Pharmacy:  [facility] [address] [facility] [place].   Thank you.

## 2015-09-12 NOTE — Telephone Encounter (Signed)
Please refill.

## 2015-09-12 NOTE — Telephone Encounter (Signed)
Prescription called to pharmacy.

## 2015-10-13 ENCOUNTER — Other Ambulatory Visit: Payer: Self-pay | Admitting: Internal Medicine

## 2015-10-14 NOTE — Telephone Encounter (Signed)
Phoned to pharmacy 

## 2015-10-14 NOTE — Telephone Encounter (Signed)
Refill x 90 days 

## 2015-10-14 NOTE — Telephone Encounter (Signed)
Patient is calling to get a refill on his Valium 5mg .  [place] are leaving for the beach on [place].    [facility]  [facility] [address].

## 2015-11-28 ENCOUNTER — Encounter (HOSPITAL_COMMUNITY): Payer: Self-pay | Admitting: Emergency Medicine

## 2015-11-28 ENCOUNTER — Emergency Department (HOSPITAL_COMMUNITY)
Admission: EM | Admit: 2015-11-28 | Discharge: 2015-11-28 | Disposition: A | Discharge status: Home / Self Care | Payer: Medicare Other | Attending: Emergency Medicine | Admitting: Emergency Medicine

## 2015-11-28 ENCOUNTER — Emergency Department (HOSPITAL_COMMUNITY): Payer: Medicare Other

## 2015-11-28 DIAGNOSIS — G43A0 Cyclical vomiting, not intractable: Secondary | ICD-10-CM | POA: Diagnosis present

## 2015-11-28 DIAGNOSIS — Z79899 Other long term (current) drug therapy: Secondary | ICD-10-CM | POA: Insufficient documentation

## 2015-11-28 DIAGNOSIS — Z85830 Personal history of malignant neoplasm of bone: Secondary | ICD-10-CM | POA: Diagnosis not present

## 2015-11-28 DIAGNOSIS — I10 Essential (primary) hypertension: Secondary | ICD-10-CM | POA: Diagnosis not present

## 2015-11-28 DIAGNOSIS — R1115 Cyclical vomiting syndrome unrelated to migraine: Secondary | ICD-10-CM

## 2015-11-28 DIAGNOSIS — E860 Dehydration: Secondary | ICD-10-CM | POA: Diagnosis not present

## 2015-11-28 DIAGNOSIS — Z791 Long term (current) use of non-steroidal anti-inflammatories (NSAID): Secondary | ICD-10-CM | POA: Insufficient documentation

## 2015-11-28 DIAGNOSIS — R1084 Generalized abdominal pain: Secondary | ICD-10-CM | POA: Diagnosis not present

## 2015-11-28 LAB — URINALYSIS, ROUTINE W REFLEX MICROSCOPIC
Bilirubin Urine: NEGATIVE
Glucose, UA: NEGATIVE mg/dL
Hgb urine dipstick: NEGATIVE
Ketones, ur: 40 mg/dL — AB
LEUKOCYTES UA: NEGATIVE
Nitrite: NEGATIVE
PROTEIN: NEGATIVE mg/dL
Specific Gravity, Urine: 1.031 — ABNORMAL HIGH (ref 1.005–1.030)
pH: 7 (ref 5.0–8.0)

## 2015-11-28 LAB — COMPREHENSIVE METABOLIC PANEL
ALBUMIN: 4.7 g/dL (ref 3.5–5.0)
ALK PHOS: 69 U/L (ref 38–126)
ALT: 18 U/L (ref 17–63)
AST: 26 U/L (ref 15–41)
Anion gap: 13 (ref 5–15)
BUN: 28 mg/dL — ABNORMAL HIGH (ref 6–20)
CALCIUM: 10.1 mg/dL (ref 8.9–10.3)
CO2: 22 mmol/L (ref 22–32)
CREATININE: 1.29 mg/dL — AB (ref 0.61–1.24)
Chloride: 107 mmol/L (ref 101–111)
Glucose, Bld: 152 mg/dL — ABNORMAL HIGH (ref 65–99)
Potassium: 3.8 mmol/L (ref 3.5–5.1)
SODIUM: 142 mmol/L (ref 135–145)
Total Bilirubin: 0.7 mg/dL (ref 0.3–1.2)
Total Protein: 7.7 g/dL (ref 6.5–8.1)

## 2015-11-28 LAB — CBC
HCT: 42.9 % (ref 39.0–52.0)
Hemoglobin: 14.8 g/dL (ref 13.0–17.0)
MCH: 30.8 pg (ref 26.0–34.0)
MCHC: 34.5 g/dL (ref 30.0–36.0)
MCV: 89.2 fL (ref 78.0–100.0)
PLATELETS: 238 10*3/uL (ref 150–400)
RBC: 4.81 MIL/uL (ref 4.22–5.81)
RDW: 13.1 % (ref 11.5–15.5)
WBC: 10.2 10*3/uL (ref 4.0–10.5)

## 2015-11-28 LAB — LIPASE, BLOOD: LIPASE: 23 U/L (ref 11–51)

## 2015-11-28 MED ORDER — SODIUM CHLORIDE 0.9 % IV BOLUS (SEPSIS)
1000.0000 mL | Freq: Once | INTRAVENOUS | Status: AC
  Administered 2015-11-28: 1000 mL via INTRAVENOUS

## 2015-11-28 MED ORDER — PROMETHAZINE HCL 25 MG PO TABS: 25.0000 mg | ORAL_TABLET | Freq: Four times a day (QID) | ORAL | 0 refills | Status: DC | PRN

## 2015-11-28 MED ORDER — HYDROMORPHONE HCL 1 MG/ML IJ SOLN
0.5000 mg | Freq: Once | INTRAMUSCULAR | Status: AC
  Administered 2015-11-28: 0.5 mg via INTRAVENOUS
  Filled 2015-11-28: qty 1

## 2015-11-28 MED ORDER — ONDANSETRON HCL 4 MG/2ML IJ SOLN
4.0000 mg | Freq: Once | INTRAMUSCULAR | Status: AC
  Administered 2015-11-28: 4 mg via INTRAVENOUS
  Filled 2015-11-28: qty 2

## 2015-11-28 NOTE — ED Notes (Signed)
Patient transported to X-ray 

## 2015-11-28 NOTE — ED Provider Notes (Signed)
[facility] DEPT Provider Note   CSN: [id number] Arrival date & time: 11/28/15  1716  First Provider Contact:  None       History   Chief Complaint Chief Complaint  Patient presents with  . Emesis    HPI Verlon Kilbourn is a 55 y.o. male.  HPI Benjamin Valdez is a 55 y.o. male with history of angiosarcoma with left leg amputation in the past, hypertension, gastritis, marijuana abuse, presents to emergency department complaining of nausea, vomiting, abdominal pain onset this morning. He reports greater than 20 episodes of emesis. He reports chills and sweats. He has tried ODT Zofran with no relief. He states that his symptoms are similar to prior cyclical vomiting episodes. He reports his last episode was greater than 2 years ago. He states when it is best that he normally receives IV medications which help. He denies a fever or chills. He denies any diarrhea or constipation. He denies any blood in his emesis. He states he is throwing up clear fluid. He has not eaten or drank anything today. He does admits to almost daily marijuana use.  Past Medical History:  Diagnosis Date  . Angiosarcoma ([place])    Of the left leg  . Bone cancer ([employer])    has left BK amputation  . Chest pain April 2010   Myocardial Infarction ruled out  . Dyslipidemia   . Essential hypertension   . Gastritis April 2010   Acute--Resolved  . Hiatal hernia   . Marijuana abuse    History of Marijuana use  . Nausea alone    Recurrent  . Neck problem   . Vomiting     Patient Active Problem List   Diagnosis Date Noted  . Hypercholesterolemia 01/28/2011  . Bone sarcoma ([place]) 01/28/2011    Past Surgical History:  Procedure Laterality Date  . AMPUTATION     Left leg below the knee   . APPENDECTOMY    . CERVICAL LAMINECTOMY         Home Medications    Prior to Admission medications   Medication Sig Start Date End Date Taking? Authorizing Provider  amitriptyline (ELAVIL) 50 MG tablet Take  by mouth at bedtime. Takes 50-100mg  (1-2tablets) by mouth at bedtime as needed for sleep.    Historical Provider, MD  diazepam (VALIUM) 5 MG tablet TAKE 1 TABLET BY MOUTH EVERY 8 HOURS AS NEEDED FOR ANXIETY 10/14/15   Elby Showers, MD  ibuprofen (ADVIL,MOTRIN) 800 MG tablet Take 1 tablet (800 mg total) by mouth 3 (three) times daily. 07/22/15   Delsa Grana, PA-C  omeprazole (PRILOSEC) 40 MG capsule TAKE ONE CAPSULE BY MOUTH ONE TIME DAILY 03/19/15   Darlin Coco, MD  PARoxetine (PAXIL) 30 MG tablet Take 45 mg by mouth every morning.    Historical Provider, MD  promethazine (PHENERGAN) 25 MG suppository Place 25 mg rectally every 6 (six) hours as needed for nausea.    Historical Provider, MD  rosuvastatin (CRESTOR) 20 MG tablet TAKE ONE TABLET BY MOUTH ONE TIME DAILY 03/28/15   Darlin Coco, MD    Family History Family History  Problem Relation Age of Onset  . Diabetes Mother   . Bladder Cancer Father   . Crohn's disease Brother     Social History Social History  Substance Use Topics  . Smoking status: Never Smoker  . Smokeless tobacco: Never Used  . Alcohol use No     Allergies   Review of patient's allergies indicates no known allergies.  Review of Systems Review of Systems  Constitutional: Positive for chills and diaphoresis. Negative for fever.  Respiratory: Negative for cough, chest tightness and shortness of breath.   Cardiovascular: Negative for chest pain, palpitations and leg swelling.  Gastrointestinal: Positive for abdominal pain, nausea and vomiting. Negative for abdominal distention, constipation and diarrhea.  Musculoskeletal: Negative for arthralgias, myalgias, neck pain and neck stiffness.  Skin: Negative for rash.  Allergic/Immunologic: Negative for immunocompromised state.  Neurological: Negative for dizziness, weakness, light-headedness, numbness and headaches.  All other systems reviewed and are negative.    Physical Exam Updated Vital Signs BP  140/74 (BP Location: Left Arm)   Pulse 83   Temp 97.6 F (36.4 C) (Oral)   Resp 20   SpO2 100%   Physical Exam  Constitutional: He appears well-developed and well-nourished.  Actively vomiting  HENT:  Head: Normocephalic and atraumatic.  Eyes: Conjunctivae are normal.  Neck: Neck supple.  Cardiovascular: Normal rate, regular rhythm and normal heart sounds.   Pulmonary/Chest: Effort normal. No respiratory distress. He has no wheezes. He has no rales.  Abdominal: Soft. Bowel sounds are normal. He exhibits no distension. There is tenderness. There is no rebound and no guarding.  Diffuse tenderness  Musculoskeletal: He exhibits no edema.  Neurological: He is alert.  Skin: Skin is warm. He is diaphoretic.  Nursing note and vitals reviewed.    ED Treatments / Results  Labs (all labs ordered are listed, but only abnormal results are displayed) Labs Reviewed  COMPREHENSIVE METABOLIC PANEL - Abnormal; Notable for the following:       Result Value   Glucose, Bld 152 (*)    BUN 28 (*)    Creatinine, Ser 1.29 (*)    All other components within normal limits  URINALYSIS, ROUTINE W REFLEX MICROSCOPIC (NOT AT [facility]) - Abnormal; Notable for the following:    Specific Gravity, Urine 1.031 (*)    Ketones, ur 40 (*)    All other components within normal limits  LIPASE, BLOOD  CBC    EKG  EKG Interpretation None       Radiology Dg Abd Acute W/chest  Result Date: 11/28/2015 CLINICAL DATA:  Vomiting. EXAM: DG ABDOMEN ACUTE W/ 1V CHEST COMPARISON:  Radiograph of July 22, 2015. FINDINGS: There is no evidence of dilated bowel loops or free intraperitoneal air. No radiopaque calculi or other significant radiographic abnormality is seen. Heart size and mediastinal contours are within normal limits. Both lungs are clear. IMPRESSION: No evidence of bowel obstruction or ileus. No acute cardiopulmonary disease. Electronically Signed   By: Marijo Conception, M.D.   On: 11/28/2015 20:23     Procedures Procedures (including critical care time)  Medications Ordered in ED Medications  sodium chloride 0.9 % bolus 1,000 mL (not administered)  ondansetron (ZOFRAN) injection 4 mg (4 mg Intravenous Given 11/28/15 1908)  sodium chloride 0.9 % bolus 1,000 mL (1,000 mLs Intravenous New Bag/Given 11/28/15 1906)  HYDROmorphone (DILAUDID) injection 0.5 mg (0.5 mg Intravenous Given 11/28/15 1908)     Initial Impression / Assessment and Plan / ED Course  I have reviewed the triage vital signs and the nursing notes.  Pertinent labs & imaging results that were available during my care of the patient were reviewed by me and considered in my medical decision making (see chart for details).  Clinical Course  Comment By Time  Patient received 2 L of IV fluids, Zofran, doesn't Dilaudid. Patient feels much better. He does not appear to be in any  distress, he is joking and laughing. He was able to drink 2 cups of ginger ale. He is stable for discharge home. We'll discharge home with Phenergan, he states he has Zofran at home. Return precautions discussed. Jeannett Senior, PA-C 08/10 2107  Patient in emergency department with nausea, vomiting, diffuse abdominal pain. History of cyclical vomiting syndrome. No recurrence in the last 2 years. Patient does have diffuse tenderness over abdomen with no guarding or rebound tenderness. Will get acute abdomen series, will try fluids, Zofran, Dilaudid  After medications and fluids, patient felt much better was able to tolerate oral fluids. His labs show slightly elevated creatinine level as well as BUN level which is consistent with dehydration from his multiple episodes of emesis. He had 40 ketones in his urine also consistent with dehydration. Abdomen no longer painful or tender. Doubt surgical abdomen at this time. Acute abd series negative. Will discharge home with Phenergan, follow up with primary care doctor for recheck of labs.  Final Clinical  Impressions(s) / ED Diagnoses   Final diagnoses:  Non-intractable cyclical vomiting with nausea  Dehydration    New Prescriptions New Prescriptions   PROMETHAZINE (PHENERGAN) 25 MG TABLET    Take 1 tablet (25 mg total) by mouth every 6 (six) hours as needed for nausea or vomiting.     Jeannett Senior, PA-C 11/28/15 2113    Daleen Bo, MD 11/29/15 2040

## 2015-11-28 NOTE — ED Triage Notes (Signed)
Patient reports history of cyclic vomiting syndrome that began approximately 1200 today. Reports 10+ episodes of emesis and abdominal pain that began after emesis. Denies fever, diarrhea. Patient initially dry heaving and shaking feverishly, give two warm blankets. Patient is more calm now, resting with eyes closed, respirations even and unlabored.

## 2015-11-28 NOTE — Discharge Instructions (Signed)
Zofran or phenergan for nausea and vomiting. Continue to drink fluids. Advance diet as tolerate. Follow up with your doctor. Avoid cannabis in the next several days/weeks. Return if worsening.

## 2016-01-26 ENCOUNTER — Other Ambulatory Visit: Payer: Self-pay | Admitting: Internal Medicine

## 2016-01-26 NOTE — Telephone Encounter (Signed)
Call in #90 with NO refill

## 2016-01-28 ENCOUNTER — Other Ambulatory Visit: Payer: Self-pay | Admitting: Internal Medicine

## 2016-01-28 ENCOUNTER — Telehealth: Payer: Self-pay | Admitting: Internal Medicine

## 2016-01-28 NOTE — Telephone Encounter (Signed)
I believe this was handled yesterday. He is to check with his pharmacy. No need to call here. He should just contact the pharmacy. Please call him back

## 2016-01-28 NOTE — Telephone Encounter (Signed)
Patient would like the following medications refilled:  1) Diazapam 2) Rosuvastatin

## 2016-01-28 NOTE — Telephone Encounter (Signed)
Left message on patient's answering machine that the prescriptions are at his pharmacist and that he should call them.

## 2016-02-12 ENCOUNTER — Telehealth: Payer: Self-pay | Admitting: Internal Medicine

## 2016-02-12 NOTE — Telephone Encounter (Signed)
Opened the wrong patient.  Should've been father, not the son.  Erroneous encounter.

## 2016-02-27 ENCOUNTER — Other Ambulatory Visit: Payer: Self-pay | Admitting: Internal Medicine

## 2016-02-27 NOTE — Telephone Encounter (Signed)
There are 2 Rx here. Just fill the one for #90 and tell them to void the other one

## 2016-03-11 ENCOUNTER — Other Ambulatory Visit: Payer: Self-pay

## 2016-03-11 MED ORDER — OMEPRAZOLE 40 MG PO CPDR: 40.0000 mg | DELAYED_RELEASE_CAPSULE | Freq: Every day | ORAL | 0 refills | Status: DC

## 2016-03-13 ENCOUNTER — Emergency Department (HOSPITAL_COMMUNITY)
Admission: EM | Admit: 2016-03-13 | Discharge: 2016-03-13 | Disposition: A | Discharge status: Home / Self Care | Payer: Medicare Other | Attending: Emergency Medicine | Admitting: Emergency Medicine

## 2016-03-13 ENCOUNTER — Encounter (HOSPITAL_COMMUNITY): Payer: Self-pay | Admitting: Emergency Medicine

## 2016-03-13 DIAGNOSIS — G43A0 Cyclical vomiting, not intractable: Secondary | ICD-10-CM | POA: Diagnosis not present

## 2016-03-13 DIAGNOSIS — Z85830 Personal history of malignant neoplasm of bone: Secondary | ICD-10-CM | POA: Insufficient documentation

## 2016-03-13 DIAGNOSIS — R1115 Cyclical vomiting syndrome unrelated to migraine: Secondary | ICD-10-CM

## 2016-03-13 DIAGNOSIS — Z79899 Other long term (current) drug therapy: Secondary | ICD-10-CM | POA: Diagnosis not present

## 2016-03-13 DIAGNOSIS — I10 Essential (primary) hypertension: Secondary | ICD-10-CM | POA: Insufficient documentation

## 2016-03-13 DIAGNOSIS — R1084 Generalized abdominal pain: Secondary | ICD-10-CM | POA: Diagnosis present

## 2016-03-13 LAB — COMPREHENSIVE METABOLIC PANEL
ALT: 20 U/L (ref 17–63)
ANION GAP: 14 (ref 5–15)
AST: 24 U/L (ref 15–41)
Albumin: 5 g/dL (ref 3.5–5.0)
Alkaline Phosphatase: 73 U/L (ref 38–126)
BUN: 27 mg/dL — ABNORMAL HIGH (ref 6–20)
CHLORIDE: 110 mmol/L (ref 101–111)
CO2: 20 mmol/L — AB (ref 22–32)
Calcium: 10.3 mg/dL (ref 8.9–10.3)
Creatinine, Ser: 1.22 mg/dL (ref 0.61–1.24)
GFR calc non Af Amer: >60 mL/min (ref >60)
Glucose, Bld: 150 mg/dL — ABNORMAL HIGH (ref 65–99)
Potassium: 3.7 mmol/L (ref 3.5–5.1)
SODIUM: 144 mmol/L (ref 135–145)
Total Bilirubin: 0.7 mg/dL (ref 0.3–1.2)
Total Protein: 8 g/dL (ref 6.5–8.1)

## 2016-03-13 LAB — CBC
HCT: 44.9 % (ref 39.0–52.0)
HEMOGLOBIN: 15.5 g/dL (ref 13.0–17.0)
MCH: 30.5 pg (ref 26.0–34.0)
MCHC: 34.5 g/dL (ref 30.0–36.0)
MCV: 88.4 fL (ref 78.0–100.0)
Platelets: 249 10*3/uL (ref 150–400)
RBC: 5.08 MIL/uL (ref 4.22–5.81)
RDW: 12.8 % (ref 11.5–15.5)
WBC: 13.3 10*3/uL — ABNORMAL HIGH (ref 4.0–10.5)

## 2016-03-13 LAB — URINALYSIS, ROUTINE W REFLEX MICROSCOPIC
Bilirubin Urine: NEGATIVE
GLUCOSE, UA: NEGATIVE mg/dL
HGB URINE DIPSTICK: NEGATIVE
Ketones, ur: NEGATIVE mg/dL
Leukocytes, UA: NEGATIVE
Nitrite: NEGATIVE
Protein, ur: NEGATIVE mg/dL
SPECIFIC GRAVITY, URINE: 1.028 (ref 1.005–1.030)
pH: 5 (ref 5.0–8.0)

## 2016-03-13 LAB — LIPASE, BLOOD: Lipase: 27 U/L (ref 11–51)

## 2016-03-13 MED ORDER — SODIUM CHLORIDE 0.9 % IV BOLUS (SEPSIS)
1000.0000 mL | Freq: Once | INTRAVENOUS | Status: AC
  Administered 2016-03-13: 1000 mL via INTRAVENOUS

## 2016-03-13 MED ORDER — PROMETHAZINE HCL 25 MG PO TABS
25.0000 mg | ORAL_TABLET | Freq: Four times a day (QID) | ORAL | 0 refills | Status: DC | PRN
Start: 2016-03-13 — End: 2016-03-30

## 2016-03-13 MED ORDER — ONDANSETRON 4 MG PO TBDP
4.0000 mg | ORAL_TABLET | Freq: Once | ORAL | Status: AC | PRN
  Administered 2016-03-13: 4 mg via ORAL
  Filled 2016-03-13: qty 1

## 2016-03-13 MED ORDER — HYDROMORPHONE HCL 1 MG/ML IJ SOLN
1.0000 mg | Freq: Once | INTRAMUSCULAR | Status: AC
  Administered 2016-03-13: 1 mg via INTRAVENOUS
  Filled 2016-03-13: qty 1

## 2016-03-13 MED ORDER — LORAZEPAM 2 MG/ML IJ SOLN
1.0000 mg | Freq: Once | INTRAMUSCULAR | Status: AC
  Administered 2016-03-13: 1 mg via INTRAVENOUS
  Filled 2016-03-13: qty 1

## 2016-03-13 MED ORDER — ONDANSETRON HCL 4 MG/2ML IJ SOLN
4.0000 mg | Freq: Once | INTRAMUSCULAR | Status: AC
  Administered 2016-03-13: 4 mg via INTRAVENOUS
  Filled 2016-03-13: qty 2

## 2016-03-13 NOTE — ED Provider Notes (Signed)
[facility] DEPT Provider Note   CSN: [id number] Arrival date & time: 03/13/16  1533     History   Chief Complaint Chief Complaint  Patient presents with  . Abdominal Pain    HPI Benjamin Valdez is a 55 y.o. male.  Patient presents to the ED with a chief complaint of generalized abdominal pain.  He reports associated nausea and vomiting.  He states that he has episodes of cyclical vomiting.  He is a marijuana user, but doesn't associate this with his symptoms.  He states that his abdomen is sore from all of the retching he has been doing.  He denies any fevers, or chills.  He has not taken anything for his symptoms.  There are no modifying factors.   The history is provided by the patient. No language interpreter was used.    Past Medical History:  Diagnosis Date  . 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 Bone cancer ([place])    has left BK amputation  . Chest pain April 2010   Myocardial Infarction ruled out  . Dyslipidemia   . Essential hypertension   . Gastritis April 2010   Acute--Resolved  . Hiatal hernia   . Marijuana abuse    History of Marijuana use  . Nausea alone    Recurrent  . Neck problem   . Vomiting     Patient Active Problem List   Diagnosis Date Noted  . Hypercholesterolemia 01/28/2011  . Bone sarcoma ([place]) 01/28/2011    Past Surgical History:  Procedure Laterality Date  . AMPUTATION     Left leg below the knee   . APPENDECTOMY    . CERVICAL LAMINECTOMY         Home Medications    Prior to Admission medications   Medication Sig Start Date End Date Taking? Authorizing Provider  amitriptyline (ELAVIL) 50 MG tablet Take by mouth at bedtime. Takes 50-100mg  (1-2tablets) by mouth at bedtime as needed for sleep.    Historical Provider, MD  diazepam (VALIUM) 5 MG tablet TAKE 1 TABLET BY MOUTH EVERY 8 HOURS AS NEEDED FOR ANXIETY 02/27/16   Elby Showers, MD  ibuprofen (ADVIL,MOTRIN) 800 MG tablet Take 1 tablet (800 mg total) by mouth 3  (three) times daily. 07/22/15   Delsa Grana, PA-C  Melatonin 10 MG TABS Take 1 tablet by mouth at bedtime.    Historical Provider, MD  omeprazole (PRILOSEC) 40 MG capsule Take 1 capsule (40 mg total) by mouth daily. 03/11/16   Josue Hector, MD  PARoxetine (PAXIL) 30 MG tablet Take 45 mg by mouth every morning.    Historical Provider, MD  promethazine (PHENERGAN) 25 MG tablet Take 1 tablet (25 mg total) by mouth every 6 (six) hours as needed for nausea or vomiting. 11/28/15   Jeannett Senior, PA-C  rosuvastatin (CRESTOR) 20 MG tablet TAKE ONE TABLET BY MOUTH ONE TIME DAILY 01/28/16   Elby Showers, MD    Family History Family History  Problem Relation Age of Onset  . Diabetes Mother   . Bladder Cancer Father   . Crohn's disease Brother     Social History Social History  Substance Use Topics  . Smoking status: Never Smoker  . Smokeless tobacco: Never Used  . Alcohol use No     Allergies   Patient has no known allergies.   Review of Systems Review of Systems  All other systems reviewed and are negative.    Physical Exam Updated Vital Signs  BP (!) 167/108 (BP Location: Left Arm)   Pulse 117   Temp 98.2 F (36.8 C) (Oral)   Resp 22   SpO2 100%   Physical Exam  Constitutional: He is oriented to person, place, and time. He appears well-developed and well-nourished.  HENT:  Head: Normocephalic and atraumatic.  Eyes: Conjunctivae and EOM are normal. Pupils are equal, round, and reactive to light. Right eye exhibits no discharge. Left eye exhibits no discharge. No scleral icterus.  Neck: Normal range of motion. Neck supple. No JVD present.  Cardiovascular: Normal rate, regular rhythm and normal heart sounds.  Exam reveals no gallop and no friction rub.   No murmur heard. Pulmonary/Chest: Effort normal and breath sounds normal. No respiratory distress. He has no wheezes. He has no rales. He exhibits no tenderness.  Abdominal: Soft. He exhibits no distension and no mass.  There is tenderness. There is no rebound and no guarding.  Generalized abdominal tenderness, no focal tenderness  Musculoskeletal: Normal range of motion. He exhibits no edema or tenderness.  Neurological: He is alert and oriented to person, place, and time.  Skin: Skin is warm and dry.  Psychiatric: He has a normal mood and affect. His behavior is normal. Judgment and thought content normal.  Nursing note and vitals reviewed.    ED Treatments / Results  Labs (all labs ordered are listed, but only abnormal results are displayed) Labs Reviewed  COMPREHENSIVE METABOLIC PANEL - Abnormal; Notable for the following:       Result Value   CO2 20 (*)    Glucose, Bld 150 (*)    BUN 27 (*)    All other components within normal limits  CBC - Abnormal; Notable for the following:    WBC 13.3 (*)    All other components within normal limits  LIPASE, BLOOD  URINALYSIS, ROUTINE W REFLEX MICROSCOPIC (NOT AT [facility])    EKG  EKG Interpretation None       Radiology No results found.  Procedures Procedures (including critical care time)  Medications Ordered in ED Medications  ondansetron (ZOFRAN-ODT) disintegrating tablet 4 mg (4 mg Oral Given 03/13/16 1608)  HYDROmorphone (DILAUDID) injection 1 mg (1 mg Intravenous Given 03/13/16 1900)  sodium chloride 0.9 % bolus 1,000 mL (1,000 mLs Intravenous New Bag/Given 03/13/16 1900)  ondansetron (ZOFRAN) injection 4 mg (4 mg Intravenous Given 03/13/16 1900)  LORazepam (ATIVAN) injection 1 mg (1 mg Intravenous Given 03/13/16 1900)     Initial Impression / Assessment and Plan / ED Course  I have reviewed the triage vital signs and the nursing notes.  Pertinent labs & imaging results that were available during my care of the patient were reviewed by me and considered in my medical decision making (see chart for details).  Clinical Course     Patient with nausea and vomiting.  Has had episodes of cyclical vomiting in the past.  Likely cannabis  induced. Will give fluids, pain meds, and antiemetic.  7:52 PM Patient feels improved, but is still having some nausea and abdominal pain. Will give an additional dose of fluids, pain medicine, and Zofran. Will reassess. Anticipate discharge.  8:55 PM Patient is feeling much better. Tolerating oral intake. Will discharge to home with primary care follow-up. Advised against using cannabis.  Final Clinical Impressions(s) / ED Diagnoses   Final diagnoses:  Non-intractable cyclical vomiting with nausea    New Prescriptions New Prescriptions   PROMETHAZINE (PHENERGAN) 25 MG TABLET    Take 1 tablet (25 mg total)  by mouth every 6 (six) hours as needed for nausea or vomiting.     Montine Circle, PA-C 03/13/16 2055    Charlesetta Shanks, MD 03/16/16 [phone]

## 2016-03-13 NOTE — ED Triage Notes (Signed)
Pt c/o abdominal pain, nausea and vomiting onset of this morning. States he has these episodes and gets dehydrated. Pt requesting IV fluids and phenergan.

## 2016-03-13 NOTE — Discharge Instructions (Signed)
FYI: Look up [facility]. Return if symptoms worsen.

## 2016-03-15 ENCOUNTER — Encounter (HOSPITAL_COMMUNITY): Payer: Self-pay

## 2016-03-15 ENCOUNTER — Emergency Department (HOSPITAL_COMMUNITY)
Admission: EM | Admit: 2016-03-15 | Discharge: 2016-03-15 | Disposition: A | Discharge status: Home / Self Care | Payer: Medicare Other | Attending: Emergency Medicine | Admitting: Emergency Medicine

## 2016-03-15 ENCOUNTER — Emergency Department (HOSPITAL_COMMUNITY): Payer: Medicare Other

## 2016-03-15 DIAGNOSIS — Z85830 Personal history of malignant neoplasm of bone: Secondary | ICD-10-CM | POA: Insufficient documentation

## 2016-03-15 DIAGNOSIS — R112 Nausea with vomiting, unspecified: Secondary | ICD-10-CM | POA: Diagnosis present

## 2016-03-15 DIAGNOSIS — G43A0 Cyclical vomiting, not intractable: Secondary | ICD-10-CM | POA: Insufficient documentation

## 2016-03-15 DIAGNOSIS — R109 Unspecified abdominal pain: Secondary | ICD-10-CM

## 2016-03-15 DIAGNOSIS — I10 Essential (primary) hypertension: Secondary | ICD-10-CM | POA: Diagnosis not present

## 2016-03-15 DIAGNOSIS — R1115 Cyclical vomiting syndrome unrelated to migraine: Secondary | ICD-10-CM

## 2016-03-15 DIAGNOSIS — R1084 Generalized abdominal pain: Secondary | ICD-10-CM | POA: Insufficient documentation

## 2016-03-15 LAB — CBC
HCT: 43.6 % (ref 39.0–52.0)
HEMOGLOBIN: 14.8 g/dL (ref 13.0–17.0)
MCH: 30.3 pg (ref 26.0–34.0)
MCHC: 33.9 g/dL (ref 30.0–36.0)
MCV: 89.3 fL (ref 78.0–100.0)
Platelets: 216 10*3/uL (ref 150–400)
RBC: 4.88 MIL/uL (ref 4.22–5.81)
RDW: 13 % (ref 11.5–15.5)
WBC: 8.3 10*3/uL (ref 4.0–10.5)

## 2016-03-15 LAB — COMPREHENSIVE METABOLIC PANEL
ALT: 27 U/L (ref 17–63)
ANION GAP: 11 (ref 5–15)
AST: 33 U/L (ref 15–41)
Albumin: 4.8 g/dL (ref 3.5–5.0)
Alkaline Phosphatase: 65 U/L (ref 38–126)
BUN: 25 mg/dL — ABNORMAL HIGH (ref 6–20)
CHLORIDE: 103 mmol/L (ref 101–111)
CO2: 27 mmol/L (ref 22–32)
CREATININE: 1.19 mg/dL (ref 0.61–1.24)
Calcium: 9.7 mg/dL (ref 8.9–10.3)
Glucose, Bld: 129 mg/dL — ABNORMAL HIGH (ref 65–99)
Potassium: 3.9 mmol/L (ref 3.5–5.1)
SODIUM: 141 mmol/L (ref 135–145)
Total Bilirubin: 0.4 mg/dL (ref 0.3–1.2)
Total Protein: 8.1 g/dL (ref 6.5–8.1)

## 2016-03-15 LAB — URINALYSIS, ROUTINE W REFLEX MICROSCOPIC
Bilirubin Urine: NEGATIVE
Glucose, UA: NEGATIVE mg/dL
Hgb urine dipstick: NEGATIVE
Ketones, ur: NEGATIVE mg/dL
LEUKOCYTES UA: NEGATIVE
Nitrite: NEGATIVE
PROTEIN: NEGATIVE mg/dL
SPECIFIC GRAVITY, URINE: 1.022 (ref 1.005–1.030)
pH: 7.5 (ref 5.0–8.0)

## 2016-03-15 LAB — LIPASE, BLOOD: LIPASE: 28 U/L (ref 11–51)

## 2016-03-15 MED ORDER — SODIUM CHLORIDE 0.9 % IV SOLN
INTRAVENOUS | Status: DC
  Administered 2016-03-15: 14:00:00 via INTRAVENOUS

## 2016-03-15 MED ORDER — ONDANSETRON 8 MG PO TBDP: 8.0000 mg | ORAL_TABLET | Freq: Three times a day (TID) | ORAL | 0 refills | Status: DC | PRN

## 2016-03-15 MED ORDER — HALOPERIDOL LACTATE 5 MG/ML IJ SOLN
2.0000 mg | Freq: Once | INTRAMUSCULAR | Status: AC
  Administered 2016-03-15: 2 mg via INTRAVENOUS
  Filled 2016-03-15: qty 1

## 2016-03-15 MED ORDER — HYDROMORPHONE HCL 1 MG/ML IJ SOLN
1.0000 mg | Freq: Once | INTRAMUSCULAR | Status: AC
  Administered 2016-03-15: 1 mg via INTRAVENOUS
  Filled 2016-03-15: qty 1

## 2016-03-15 MED ORDER — ONDANSETRON HCL 4 MG/2ML IJ SOLN
4.0000 mg | Freq: Once | INTRAMUSCULAR | Status: AC
  Administered 2016-03-15: 4 mg via INTRAVENOUS
  Filled 2016-03-15: qty 2

## 2016-03-15 MED ORDER — SODIUM CHLORIDE 0.9 % IV BOLUS (SEPSIS)
1000.0000 mL | Freq: Once | INTRAVENOUS | Status: AC
  Administered 2016-03-15: 1000 mL via INTRAVENOUS

## 2016-03-15 NOTE — ED Triage Notes (Signed)
Pt here with cyclic vomiting. Seen 2 days ago for same and had some improvement. Coming back. Abdominal discomfort when he vomits.

## 2016-03-15 NOTE — ED Provider Notes (Signed)
[facility] DEPT Provider Note   CSN: [id number] Arrival date & time: 03/15/16  1100     History   Chief Complaint Chief Complaint  Patient presents with  . Emesis    HPI Benjamin Valdez is a 55 y.o. male.  HPI Patient presents to the emergency room for evaluation of recurrent abdominal pain and vomiting. Patient states he has a history of cyclic vomiting syndrome. This has been issue for him intermittently for many years. It's not very frequent dose we does not seen anyone regularly for this condition. He had an episode a few days ago where he was evaluated and treated in the emergency room. Patient had been feeling well until this morning when he started having vomiting and abdominal pain again. He has vomited multiple times. The abdominal pain is diffuse. He denies any fever, diarrhea or constipation. No dysuria. He is requesting pain medications and something for his nausea.  Past Medical History:  Diagnosis Date  . 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 Bone cancer ([place])    has left BK amputation  . Chest pain April 2010   Myocardial Infarction ruled out  . Dyslipidemia   . Essential hypertension   . Gastritis April 2010   Acute--Resolved  . Hiatal hernia   . Marijuana abuse    History of Marijuana use  . Nausea alone    Recurrent  . Neck problem   . Vomiting     Patient Active Problem List   Diagnosis Date Noted  . Hypercholesterolemia 01/28/2011  . Bone sarcoma ([place]) 01/28/2011    Past Surgical History:  Procedure Laterality Date  . AMPUTATION     Left leg below the knee   . APPENDECTOMY    . CERVICAL LAMINECTOMY         Home Medications    Prior to Admission medications   Medication Sig Start Date End Date Taking? Authorizing Provider  amitriptyline (ELAVIL) 50 MG tablet Take by mouth at bedtime. Takes 50-100mg  (1-2tablets) by mouth at bedtime as needed for sleep.   Yes Historical Provider, MD  diazepam (VALIUM) 5 MG tablet TAKE 1  TABLET BY MOUTH EVERY 8 HOURS AS NEEDED FOR ANXIETY 02/27/16  Yes Elby Showers, MD  Melatonin 10 MG TABS Take 1 tablet by mouth at bedtime.   Yes Historical Provider, MD  omeprazole (PRILOSEC) 40 MG capsule Take 1 capsule (40 mg total) by mouth daily. 03/11/16  Yes Josue Hector, MD  PARoxetine (PAXIL) 30 MG tablet Take 45 mg by mouth every morning.   Yes Historical Provider, MD  promethazine (PHENERGAN) 25 MG tablet Take 1 tablet (25 mg total) by mouth every 6 (six) hours as needed for nausea or vomiting. 03/13/16  Yes Montine Circle, PA-C  rosuvastatin (CRESTOR) 20 MG tablet TAKE ONE TABLET BY MOUTH ONE TIME DAILY 01/28/16  Yes Elby Showers, MD  ibuprofen (ADVIL,MOTRIN) 800 MG tablet Take 1 tablet (800 mg total) by mouth 3 (three) times daily. Patient not taking: Reported on 03/15/2016 07/22/15   Delsa Grana, PA-C  ondansetron (ZOFRAN ODT) 8 MG disintegrating tablet Take 1 tablet (8 mg total) by mouth every 8 (eight) hours as needed for nausea or vomiting. 03/15/16   Dorie Rank, MD    Family History Family History  Problem Relation Age of Onset  . Diabetes Mother   . Bladder Cancer Father   . Crohn's disease Brother     Social History Social History  Substance Use Topics  .  Smoking status: Never Smoker  . Smokeless tobacco: Never Used  . Alcohol use No     Allergies   Patient has no known allergies.   Review of Systems Review of Systems  All other systems reviewed and are negative.    Physical Exam Updated Vital Signs BP 176/99 (BP Location: Left Arm)   Pulse 70   Temp 97.9 F (36.6 C) (Oral)   Resp 18   SpO2 98%   Physical Exam  Constitutional: No distress.  Patient appears uncomfortable he has a towel draped over his head  HENT:  Head: Normocephalic and atraumatic.  Right Ear: External ear normal.  Left Ear: External ear normal.  Eyes: Conjunctivae are normal. Right eye exhibits no discharge. Left eye exhibits no discharge. No scleral icterus.  Neck: Neck  supple. No tracheal deviation present.  Cardiovascular: Normal rate, regular rhythm and intact distal pulses.   Pulmonary/Chest: Effort normal and breath sounds normal. No stridor. No respiratory distress. He has no wheezes. He has no rales.  Abdominal: Soft. Bowel sounds are normal. He exhibits no distension. There is generalized tenderness. There is no rebound and no guarding.  Musculoskeletal: He exhibits no edema or tenderness.  Neurological: He is alert. He has normal strength. No cranial nerve deficit (no facial droop, extraocular movements intact, no slurred speech) or sensory deficit. He exhibits normal muscle tone. He displays no seizure activity. Coordination normal.  Skin: Skin is warm and dry. No rash noted. He is not diaphoretic.  Psychiatric: He has a normal mood and affect.  Nursing note and vitals reviewed.    ED Treatments / Results  Labs (all labs ordered are listed, but only abnormal results are displayed) Labs Reviewed  COMPREHENSIVE METABOLIC PANEL - Abnormal; Notable for the following:       Result Value   Glucose, Bld 129 (*)    BUN 25 (*)    All other components within normal limits  LIPASE, BLOOD  CBC  URINALYSIS, ROUTINE W REFLEX MICROSCOPIC (NOT AT [facility])    Radiology Dg Abd Acute W/chest  Result Date: 03/15/2016 CLINICAL DATA:  Nausea and vomiting and shortness of Breath EXAM: DG ABDOMEN ACUTE W/ 1V CHEST COMPARISON:  11/28/2015 FINDINGS: Cardiac shadow is within normal limits. The lungs are clear bilaterally. Scattered large and small bowel gas is noted. Fecal material is noted throughout colon consistent with mild degree of constipation. No free air is seen. No acute bony abnormality is noted. No abnormal mass or abnormal calcifications are seen. IMPRESSION: Mild constipation.  No other focal abnormality is noted. Electronically Signed   By: Inez Catalina M.D.   On: 03/15/2016 14:22    Procedures Procedures (including critical care time)  Medications  Ordered in ED Medications  sodium chloride 0.9 % bolus 1,000 mL (0 mLs Intravenous Stopped 03/15/16 1409)    And  0.9 %  sodium chloride infusion ( Intravenous New Bag/Given 03/15/16 1410)  ondansetron (ZOFRAN) injection 4 mg (4 mg Intravenous Given 03/15/16 1408)  haloperidol lactate (HALDOL) injection 2 mg (2 mg Intravenous Given 03/15/16 1345)  HYDROmorphone (DILAUDID) injection 1 mg (1 mg Intravenous Given 03/15/16 1510)  haloperidol lactate (HALDOL) injection 2 mg (2 mg Intravenous Given 03/15/16 1510)     Initial Impression / Assessment and Plan / ED Course  I have reviewed the triage vital signs and the nursing notes.  Pertinent labs & imaging results that were available during my care of the patient were reviewed by me and considered in my medical decision  making (see chart for details).  Clinical Course as of Mar 16 1527  Sun Mar 15, 2016  1452 Labs are normal.  Still complaining of pain.  Will give an additional dose of pain meds  [JK]    Clinical Course User Index [JK] Dorie Rank, MD    Pt improved with treatment in the ED.  No further vomiting.  Pain did improve.  Suspect recurrent cyclical vomiting.  Doubt obstruction or acute infection.  Will dc home on medications for nausea  Final Clinical Impressions(s) / ED Diagnoses   Final diagnoses:  Abdominal pain, unspecified abdominal location  Non-intractable cyclical vomiting with nausea    New Prescriptions New Prescriptions   ONDANSETRON (ZOFRAN ODT) 8 MG DISINTEGRATING TABLET    Take 1 tablet (8 mg total) by mouth every 8 (eight) hours as needed for nausea or vomiting.     Dorie Rank, MD 03/15/16 [phone]

## 2016-03-15 NOTE — ED Notes (Signed)
Pt given urinal and made aware of need for urine 

## 2016-03-15 NOTE — Discharge Instructions (Signed)
Return as needed for worsening symptoms, take the medications for nausea as needed

## 2016-03-18 ENCOUNTER — Encounter (HOSPITAL_COMMUNITY): Payer: Self-pay | Admitting: Emergency Medicine

## 2016-03-18 ENCOUNTER — Emergency Department (HOSPITAL_COMMUNITY)
Admission: EM | Admit: 2016-03-18 | Discharge: 2016-03-18 | Disposition: A | Discharge status: Home / Self Care | Payer: Medicare Other | Attending: Emergency Medicine | Admitting: Emergency Medicine

## 2016-03-18 DIAGNOSIS — R1115 Cyclical vomiting syndrome unrelated to migraine: Secondary | ICD-10-CM

## 2016-03-18 DIAGNOSIS — G43A0 Cyclical vomiting, not intractable: Secondary | ICD-10-CM | POA: Diagnosis not present

## 2016-03-18 DIAGNOSIS — I10 Essential (primary) hypertension: Secondary | ICD-10-CM | POA: Insufficient documentation

## 2016-03-18 DIAGNOSIS — Z85830 Personal history of malignant neoplasm of bone: Secondary | ICD-10-CM | POA: Diagnosis not present

## 2016-03-18 DIAGNOSIS — R112 Nausea with vomiting, unspecified: Secondary | ICD-10-CM | POA: Diagnosis present

## 2016-03-18 LAB — COMPREHENSIVE METABOLIC PANEL
ALBUMIN: 4.3 g/dL (ref 3.5–5.0)
ALK PHOS: 62 U/L (ref 38–126)
ALT: 22 U/L (ref 17–63)
AST: 23 U/L (ref 15–41)
Anion gap: 9 (ref 5–15)
BILIRUBIN TOTAL: 0.4 mg/dL (ref 0.3–1.2)
BUN: 25 mg/dL — AB (ref 6–20)
CO2: 27 mmol/L (ref 22–32)
CREATININE: 1.26 mg/dL — AB (ref 0.61–1.24)
Calcium: 9.3 mg/dL (ref 8.9–10.3)
Chloride: 103 mmol/L (ref 101–111)
GFR calc Af Amer: >60 mL/min (ref >60)
GFR calc non Af Amer: >60 mL/min (ref >60)
GLUCOSE: 125 mg/dL — AB (ref 65–99)
POTASSIUM: 3.9 mmol/L (ref 3.5–5.1)
Sodium: 139 mmol/L (ref 135–145)
TOTAL PROTEIN: 7.2 g/dL (ref 6.5–8.1)

## 2016-03-18 LAB — CBC
HEMATOCRIT: 42.7 % (ref 39.0–52.0)
Hemoglobin: 14.7 g/dL (ref 13.0–17.0)
MCH: 30.6 pg (ref 26.0–34.0)
MCHC: 34.4 g/dL (ref 30.0–36.0)
MCV: 89 fL (ref 78.0–100.0)
PLATELETS: 217 10*3/uL (ref 150–400)
RBC: 4.8 MIL/uL (ref 4.22–5.81)
RDW: 12.6 % (ref 11.5–15.5)
WBC: 8.3 10*3/uL (ref 4.0–10.5)

## 2016-03-18 LAB — URINALYSIS, ROUTINE W REFLEX MICROSCOPIC
BILIRUBIN URINE: NEGATIVE
GLUCOSE, UA: NEGATIVE mg/dL
Hgb urine dipstick: NEGATIVE
KETONES UR: NEGATIVE mg/dL
Leukocytes, UA: NEGATIVE
Nitrite: NEGATIVE
PH: 7.5 (ref 5.0–8.0)
Protein, ur: NEGATIVE mg/dL
Specific Gravity, Urine: 1.021 (ref 1.005–1.030)

## 2016-03-18 LAB — LIPASE, BLOOD: Lipase: 22 U/L (ref 11–51)

## 2016-03-18 MED ORDER — SODIUM CHLORIDE 0.9 % IV BOLUS (SEPSIS)
1000.0000 mL | Freq: Once | INTRAVENOUS | Status: AC
  Administered 2016-03-18: 1000 mL via INTRAVENOUS

## 2016-03-18 MED ORDER — ONDANSETRON HCL 4 MG/2ML IJ SOLN
4.0000 mg | Freq: Once | INTRAMUSCULAR | Status: AC
  Administered 2016-03-18: 4 mg via INTRAVENOUS
  Filled 2016-03-18: qty 2

## 2016-03-18 MED ORDER — PROMETHAZINE HCL 25 MG RE SUPP: 25.0000 mg | Freq: Four times a day (QID) | RECTAL | 0 refills | Status: DC | PRN

## 2016-03-18 MED ORDER — FENTANYL CITRATE (PF) 100 MCG/2ML IJ SOLN
50.0000 ug | Freq: Once | INTRAMUSCULAR | Status: AC
  Administered 2016-03-18: 50 ug via INTRAVENOUS
  Filled 2016-03-18: qty 2

## 2016-03-18 MED ORDER — PROCHLORPERAZINE EDISYLATE 5 MG/ML IJ SOLN
10.0000 mg | Freq: Once | INTRAMUSCULAR | Status: AC
  Administered 2016-03-18: 10 mg via INTRAVENOUS
  Filled 2016-03-18: qty 2

## 2016-03-18 NOTE — ED Provider Notes (Signed)
[facility] DEPT Provider Note   CSN: [id number] Arrival date & time: 03/18/16  1554     History   Chief Complaint Chief Complaint  Patient presents with  . Abdominal Pain  . Emesis    HPI Benjamin Valdez is a 55 y.o. male.  HPI Patient presents with nausea and vomiting. Also abdominal pain. Reported history of cyclic vomiting. This is his third visit to the ER in the last 5 days. States he feels better when he leaves but then it comes back. States she's been throwing up for the last 5 days. He has had slight diarrhea. States he has pain with it to. States he has been worked up in the past and had every test done. States that he thinks he should've stayed for more regular fluid last time. No fevers or chills. This is similar to episodes he's had in the past but has not ever had this many flares in 1 week before. Past Medical History:  Diagnosis Date  . 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 Bone cancer ([place])    has left BK amputation  . Chest pain April 2010   Myocardial Infarction ruled out  . Dyslipidemia   . Essential hypertension   . Gastritis April 2010   Acute--Resolved  . Hiatal hernia   . Marijuana abuse    History of Marijuana use  . Nausea alone    Recurrent  . Neck problem   . Vomiting     Patient Active Problem List   Diagnosis Date Noted  . Hypercholesterolemia 01/28/2011  . Bone sarcoma ([place]) 01/28/2011    Past Surgical History:  Procedure Laterality Date  . AMPUTATION     Left leg below the knee   . APPENDECTOMY    . CERVICAL LAMINECTOMY         Home Medications    Prior to Admission medications   Medication Sig Start Date End Date Taking? Authorizing Provider  amitriptyline (ELAVIL) 50 MG tablet Take by mouth at bedtime. Takes 50-100mg  (1-2tablets) by mouth at bedtime as needed for sleep.    Historical Provider, MD  diazepam (VALIUM) 5 MG tablet TAKE 1 TABLET BY MOUTH EVERY 8 HOURS AS NEEDED FOR ANXIETY 02/27/16   Elby Showers,  MD  ibuprofen (ADVIL,MOTRIN) 800 MG tablet Take 1 tablet (800 mg total) by mouth 3 (three) times daily. Patient not taking: Reported on 03/15/2016 07/22/15   Delsa Grana, PA-C  Melatonin 10 MG TABS Take 1 tablet by mouth at bedtime.    Historical Provider, MD  omeprazole (PRILOSEC) 40 MG capsule Take 1 capsule (40 mg total) by mouth daily. 03/11/16   Josue Hector, MD  ondansetron (ZOFRAN ODT) 8 MG disintegrating tablet Take 1 tablet (8 mg total) by mouth every 8 (eight) hours as needed for nausea or vomiting. 03/15/16   Dorie Rank, MD  PARoxetine (PAXIL) 30 MG tablet Take 45 mg by mouth every morning.    Historical Provider, MD  promethazine (PHENERGAN) 25 MG suppository Place 1 suppository (25 mg total) rectally every 6 (six) hours as needed for nausea or vomiting. 03/18/16   Davonna Belling, MD  promethazine (PHENERGAN) 25 MG tablet Take 1 tablet (25 mg total) by mouth every 6 (six) hours as needed for nausea or vomiting. 03/13/16   Montine Circle, PA-C  rosuvastatin (CRESTOR) 20 MG tablet TAKE ONE TABLET BY MOUTH ONE TIME DAILY 01/28/16   Elby Showers, MD    Family History Family History  Problem Relation Age of Onset  . Diabetes Mother   . Bladder Cancer Father   . Crohn's disease Brother     Social History Social History  Substance Use Topics  . Smoking status: Never Smoker  . Smokeless tobacco: Never Used  . Alcohol use No     Allergies   Patient has no known allergies.   Review of Systems Review of Systems  Constitutional: Positive for appetite change. Negative for fatigue.  HENT: Negative for congestion.   Eyes: Negative for photophobia.  Respiratory: Negative for shortness of breath.   Cardiovascular: Negative for chest pain.  Gastrointestinal: Positive for abdominal pain, nausea and vomiting.  Genitourinary: Negative for dysuria and enuresis.  Musculoskeletal: Negative for back pain.  Neurological: Negative for headaches.  Hematological: Negative for  adenopathy.  Psychiatric/Behavioral: Negative for confusion.     Physical Exam Updated Vital Signs BP 125/80 (BP Location: Right Arm)   Pulse 95   Temp 99.5 F (37.5 C) (Oral)   Resp 16   Ht 6' (1.829 m)   Wt 185 lb (83.9 kg)   SpO2 96%   BMI 25.09 kg/m   Physical Exam  Constitutional: He appears well-developed.  HENT:  Head: Atraumatic.  Neck: Neck supple.  Cardiovascular: Normal rate.   Pulmonary/Chest: Effort normal.  Abdominal: Soft. There is tenderness.  Mild diffuse tenderness. No rebound or guarding. No hernias palpated.  Musculoskeletal: He exhibits no edema.  Neurological: He is alert.  Skin: Skin is warm.  Psychiatric: He has a normal mood and affect.     ED Treatments / Results  Labs (all labs ordered are listed, but only abnormal results are displayed) Labs Reviewed  COMPREHENSIVE METABOLIC PANEL - Abnormal; Notable for the following:       Result Value   Glucose, Bld 125 (*)    BUN 25 (*)    Creatinine, Ser 1.26 (*)    All other components within normal limits  LIPASE, BLOOD  CBC  URINALYSIS, ROUTINE W REFLEX MICROSCOPIC (NOT AT [facility])    EKG  EKG Interpretation None       Radiology No results found.  Procedures Procedures (including critical care time)  Medications Ordered in ED Medications  prochlorperazine (COMPAZINE) injection 10 mg (10 mg Intravenous Given 03/18/16 1639)  sodium chloride 0.9 % bolus 1,000 mL (0 mLs Intravenous Stopped 03/18/16 1721)  fentaNYL (SUBLIMAZE) injection 50 mcg (50 mcg Intravenous Given 03/18/16 1721)  ondansetron (ZOFRAN) injection 4 mg (4 mg Intravenous Given 03/18/16 1758)  sodium chloride 0.9 % bolus 1,000 mL (0 mLs Intravenous Stopped 03/18/16 1902)  fentaNYL (SUBLIMAZE) injection 50 mcg (50 mcg Intravenous Given 03/18/16 1818)     Initial Impression / Assessment and Plan / ED Course  I have reviewed the triage vital signs and the nursing notes.  Pertinent labs & imaging results that were  available during my care of the patient were reviewed by me and considered in my medical decision making (see chart for details).  Clinical Course     Patient with recurrent vomiting. Seen recently for same. Previous diagnosis of cyclic vomiting. Feels better after treatment. Labs reassuring. Patient does smoke marijuana but he states not all that frequently. We discussed possible adenoid hyperemesis but he has not had the bath or showers that would often go along with it. States he is going to try not smoking at all for a while and see if that will help. Will discharge home.  Final Clinical Impressions(s) / ED Diagnoses   Final  diagnoses:  Non-intractable cyclical vomiting with nausea    New Prescriptions New Prescriptions   PROMETHAZINE (PHENERGAN) 25 MG SUPPOSITORY    Place 1 suppository (25 mg total) rectally every 6 (six) hours as needed for nausea or vomiting.     Davonna Belling, MD 03/18/16 2004

## 2016-03-18 NOTE — ED Triage Notes (Signed)
Per EMS, patient is complaining of abdominal pain, nausea, vomiting x3 days. Patient was discharged for the same on Sunday. Patient states when he has these "flare ups" he normally gets 2 L NS, nausea/pain medication. Hx of bone cancer. Patient in remission. Patient given 4 mg zofran en route with EMS.

## 2016-03-30 ENCOUNTER — Other Ambulatory Visit: Payer: Self-pay | Admitting: Cardiovascular Disease

## 2016-03-30 ENCOUNTER — Telehealth: Payer: Self-pay | Admitting: Internal Medicine

## 2016-03-30 MED ORDER — PROMETHAZINE HCL 25 MG RE SUPP: 25.0000 mg | Freq: Four times a day (QID) | RECTAL | 1 refills | Status: DC | PRN

## 2016-03-30 NOTE — Telephone Encounter (Signed)
Can you check with pharmacy. Is generic phenergan 25 mg suppositories covered?

## 2016-03-30 NOTE — Telephone Encounter (Signed)
Patient calling; states that he is just following up from his visits to the ED.  He was trying to get Phenergan Suppositories filled from his ED visits.  They had prescribed these for him at some point.  States that pharmacy gave him a Rx for these when he was seen in the recent past.  States that pharmacy is advising that his insurance isn't wanting to cover these and advised that he should contact our office.  So, he wants to know if we have a way to get these covered?  States that these have helped in the past when he has had these episodes in the past.  States that they have tried pretty much everything else.    States that he'll come and see you if you want him too.  Says that he just periodically has these episodes and they flare up and this last time, it was just much worse than it has previously been.  Says that he has a 20+ year history of these episodes.     Pharmacy:  [facility] [employer] [facility] [address] Number for Contact:  [phone]

## 2016-03-30 NOTE — Telephone Encounter (Signed)
Please call in Phenergan 25 mg rectal suppositories #12 one p.r. q 4-6 hours prn vomiting with one refill

## 2016-03-30 NOTE — Telephone Encounter (Signed)
The patient needs a refill of the phenergan suppository.  The last rx was prescribed by the ER.  He is out and just needs it refilled per the pharmacist.

## 2016-04-15 ENCOUNTER — Telehealth: Payer: Self-pay | Admitting: Internal Medicine

## 2016-04-15 NOTE — Telephone Encounter (Signed)
Left message advising [facility] patient would have to pay out of pocket for the suppositories.

## 2016-06-03 ENCOUNTER — Other Ambulatory Visit: Payer: Self-pay

## 2016-06-03 MED ORDER — OMEPRAZOLE 40 MG PO CPDR: 40.0000 mg | DELAYED_RELEASE_CAPSULE | Freq: Every day | ORAL | 0 refills | Status: DC

## 2016-09-19 ENCOUNTER — Other Ambulatory Visit: Payer: Self-pay | Admitting: Internal Medicine

## 2016-09-20 ENCOUNTER — Encounter (HOSPITAL_COMMUNITY): Payer: Self-pay | Admitting: Emergency Medicine

## 2016-09-20 ENCOUNTER — Emergency Department (HOSPITAL_COMMUNITY)
Admission: EM | Admit: 2016-09-20 | Discharge: 2016-09-20 | Disposition: A | Discharge status: Home / Self Care | Payer: Medicare Other | Attending: Emergency Medicine | Admitting: Emergency Medicine

## 2016-09-20 DIAGNOSIS — Z79899 Other long term (current) drug therapy: Secondary | ICD-10-CM | POA: Diagnosis not present

## 2016-09-20 DIAGNOSIS — R1111 Vomiting without nausea: Secondary | ICD-10-CM | POA: Diagnosis present

## 2016-09-20 LAB — URINALYSIS, ROUTINE W REFLEX MICROSCOPIC
BACTERIA UA: NONE SEEN
Bilirubin Urine: NEGATIVE
GLUCOSE, UA: NEGATIVE mg/dL
HGB URINE DIPSTICK: NEGATIVE
Ketones, ur: 5 mg/dL — AB
LEUKOCYTES UA: NEGATIVE
NITRITE: NEGATIVE
PH: 5 (ref 5.0–8.0)
PROTEIN: 100 mg/dL — AB
SPECIFIC GRAVITY, URINE: 1.029 (ref 1.005–1.030)

## 2016-09-20 LAB — COMPREHENSIVE METABOLIC PANEL
ALK PHOS: 76 U/L (ref 38–126)
ALT: 19 U/L (ref 17–63)
ANION GAP: 14 (ref 5–15)
AST: 27 U/L (ref 15–41)
Albumin: 5.2 g/dL — ABNORMAL HIGH (ref 3.5–5.0)
BILIRUBIN TOTAL: 0.6 mg/dL (ref 0.3–1.2)
BUN: 22 mg/dL — AB (ref 6–20)
CALCIUM: 10.1 mg/dL (ref 8.9–10.3)
CO2: 25 mmol/L (ref 22–32)
Chloride: 102 mmol/L (ref 101–111)
Creatinine, Ser: 1.33 mg/dL — ABNORMAL HIGH (ref 0.61–1.24)
GFR calc Af Amer: >60 mL/min (ref >60)
GFR, EST NON AFRICAN AMERICAN: 59 mL/min — AB (ref >60)
GLUCOSE: 132 mg/dL — AB (ref 65–99)
POTASSIUM: 3.8 mmol/L (ref 3.5–5.1)
Sodium: 141 mmol/L (ref 135–145)
TOTAL PROTEIN: 8.8 g/dL — AB (ref 6.5–8.1)

## 2016-09-20 LAB — CBC
HEMATOCRIT: 47.7 % (ref 39.0–52.0)
Hemoglobin: 16.6 g/dL (ref 13.0–17.0)
MCH: 30.7 pg (ref 26.0–34.0)
MCHC: 34.8 g/dL (ref 30.0–36.0)
MCV: 88.3 fL (ref 78.0–100.0)
Platelets: 301 10*3/uL (ref 150–400)
RBC: 5.4 MIL/uL (ref 4.22–5.81)
RDW: 12.9 % (ref 11.5–15.5)
WBC: 15.7 10*3/uL — AB (ref 4.0–10.5)

## 2016-09-20 LAB — RAPID URINE DRUG SCREEN, HOSP PERFORMED
AMPHETAMINES: NOT DETECTED
BARBITURATES: NOT DETECTED
BENZODIAZEPINES: NOT DETECTED
Cocaine: NOT DETECTED
Opiates: NOT DETECTED
TETRAHYDROCANNABINOL: POSITIVE — AB

## 2016-09-20 LAB — LIPASE, BLOOD: Lipase: 26 U/L (ref 11–51)

## 2016-09-20 MED ORDER — ONDANSETRON HCL 4 MG/2ML IJ SOLN
4.0000 mg | Freq: Once | INTRAMUSCULAR | Status: AC | PRN
  Administered 2016-09-20: 4 mg via INTRAVENOUS
  Filled 2016-09-20: qty 2

## 2016-09-20 MED ORDER — ONDANSETRON HCL 4 MG PO TABS: 4.0000 mg | ORAL_TABLET | Freq: Three times a day (TID) | ORAL | 0 refills | Status: DC | PRN

## 2016-09-20 NOTE — ED Triage Notes (Signed)
Pt from home with complaints of emesis that began today. Pt states he has been diagnosed with cyclical vomiting before. Pt states this began today and he has had 6 episodes of emesis

## 2016-09-20 NOTE — Telephone Encounter (Signed)
Past due for CPE last was April 2017. Refilled Valium 6 months later. Needs CPE booked before refilling.

## 2016-09-20 NOTE — Discharge Instructions (Signed)

## 2016-09-20 NOTE — ED Provider Notes (Signed)
[facility] DEPT Provider Note   CSN: [id number] Arrival date & time: 09/20/16  1702     History   Chief Complaint Chief Complaint  Patient presents with  . Emesis    HPI Benjamin Valdez is a 56 y.o. male with past medical history of cyclic nausea, vomiting, and marijuana abuse who presents emergency Department with chief complaint of vomiting. Patient states that he has monthly episodes of nausea, which generally resolve on their own. Today, he began having nausea and had several episodes of nonbloody, nonbilious vomitus. He came to the emergency department. Patient got Zofran prior to my evaluation has not had any repeat episodes of vomiting since getting the medication. He has minimal abdominal pain which she rates it a 2 out of 10. He states is only bad when he is vomiting. He denies any diarrhea, chills, fevers.  HPI  Past Medical History:  Diagnosis Date  . 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 Bone cancer ([place])    has left BK amputation  . Chest pain April 2010   Myocardial Infarction ruled out  . Dyslipidemia   . Essential hypertension   . Gastritis April 2010   Acute--Resolved  . Hiatal hernia   . Marijuana abuse    History of Marijuana use  . Nausea alone    Recurrent  . Neck problem   . Vomiting     Patient Active Problem List   Diagnosis Date Noted  . Hypercholesterolemia 01/28/2011  . Bone sarcoma ([place]) 01/28/2011    Past Surgical History:  Procedure Laterality Date  . AMPUTATION     Left leg below the knee   . APPENDECTOMY    . CERVICAL LAMINECTOMY         Home Medications    Prior to Admission medications   Medication Sig Start Date End Date Taking? Authorizing Provider  amitriptyline (ELAVIL) 50 MG tablet Take by mouth at bedtime. Takes 50-100mg  (1-2tablets) by mouth at bedtime as needed for sleep.    [provider]  diazepam (VALIUM) 5 MG tablet TAKE 1 TABLET BY MOUTH EVERY 8 HOURS AS NEEDED FOR ANXIETY 02/27/16    Elby Showers, MD  ibuprofen (ADVIL,MOTRIN) 800 MG tablet Take 1 tablet (800 mg total) by mouth 3 (three) times daily. Patient not taking: Reported on 03/15/2016 07/22/15   Delsa Grana, PA-C  Melatonin 10 MG TABS Take 1 tablet by mouth at bedtime.    [provider]  omeprazole (PRILOSEC) 40 MG capsule Take 1 capsule (40 mg total) by mouth daily. 06/03/16   Josue Hector, MD  ondansetron (ZOFRAN ODT) 8 MG disintegrating tablet Take 1 tablet (8 mg total) by mouth every 8 (eight) hours as needed for nausea or vomiting. 03/15/16   Dorie Rank, MD  ondansetron (ZOFRAN) 4 MG tablet Take 1 tablet (4 mg total) by mouth every 8 (eight) hours as needed for nausea or vomiting. 09/20/16   Yisroel Mullendore, Vernie Shanks, PA-C  PARoxetine (PAXIL) 30 MG tablet Take 45 mg by mouth every morning.    [provider]  promethazine (PHENERGAN) 25 MG suppository Place 1 suppository (25 mg total) rectally every 6 (six) hours as needed for nausea or vomiting. 03/30/16   Baxley, Cresenciano Lick, MD  rosuvastatin (CRESTOR) 20 MG tablet TAKE ONE TABLET BY MOUTH ONE TIME DAILY 01/28/16   Elby Showers, MD    Family History Family History  Problem Relation Age of Onset  . Diabetes Mother   . Bladder Cancer  Father   . Crohn's disease Brother     Social History Social History  Substance Use Topics  . Smoking status: Never Smoker  . Smokeless tobacco: Never Used  . Alcohol use No     Allergies   Patient has no known allergies.   Review of Systems Review of Systems  Ten systems reviewed and are negative for acute change, except as noted in the HPI.   Physical Exam Updated Vital Signs BP (!) 139/97 (BP Location: Right Arm)   Pulse 81   Temp 97.9 F (36.6 C) (Oral)   Resp 18   Ht 6' (1.829 m)   Wt 86.2 kg (190 lb)   SpO2 94%   BMI 25.77 kg/m   Physical Exam  Physical Exam  Nursing note and vitals reviewed. Constitutional: He appears well-developed and well-nourished. No distress.  HENT:  Head:  Normocephalic and atraumatic.  Eyes: Conjunctivae normal are normal. No scleral icterus.  Neck: Normal range of motion. Neck supple.  Mouth: Dry oral mucosa Cardiovascular: Normal rate, regular rhythm and normal heart sounds.  Pulmonary/Chest: Effort normal and breath sounds normal. No respiratory distress.  Abdominal: Soft. There is no tenderness.  Musculoskeletal: He exhibits no edema.  Neurological: He is alert.  Skin: Skin is warm and dry. He is not diaphoretic.  Psychiatric: His behavior is normal.    ED Treatments / Results  Labs (all labs ordered are listed, but only abnormal results are displayed) Labs Reviewed  COMPREHENSIVE METABOLIC PANEL - Abnormal; Notable for the following:       Result Value   Glucose, Bld 132 (*)    BUN 22 (*)    Creatinine, Ser 1.33 (*)    Total Protein 8.8 (*)    Albumin 5.2 (*)    GFR calc non Af Amer 59 (*)    All other components within normal limits  CBC - Abnormal; Notable for the following:    WBC 15.7 (*)    All other components within normal limits  URINALYSIS, ROUTINE W REFLEX MICROSCOPIC - Abnormal; Notable for the following:    Color, Urine AMBER (*)    APPearance HAZY (*)    Ketones, ur 5 (*)    Protein, ur 100 (*)    Squamous Epithelial / LPF 0-5 (*)    All other components within normal limits  RAPID URINE DRUG SCREEN, HOSP PERFORMED - Abnormal; Notable for the following:    Tetrahydrocannabinol POSITIVE (*)    All other components within normal limits  LIPASE, BLOOD    EKG  EKG Interpretation None       Radiology No results found.  Procedures Procedures (including critical care time)  Medications Ordered in ED Medications  ondansetron (ZOFRAN) injection 4 mg (4 mg Intravenous Given 09/20/16 1732)     Initial Impression / Assessment and Plan / ED Course  I have reviewed the triage vital signs and the nursing notes.  Pertinent labs & imaging results that were available during my care of the patient were  reviewed by me and considered in my medical decision making (see chart for details).    The patient feeling greatly improved after IV Zofran. He is able to tolerate by mouth fluids and would like to go home and and take oral hydration. He did not wish to have any IV hydration at this time. He is slightly elevated but only just above his baseline elevation. The patient appears stated safe for discharge at this time. Discussed return precautions.  Final Clinical  Impressions(s) / ED Diagnoses   Final diagnoses:  Non-intractable vomiting without nausea, unspecified vomiting type    New Prescriptions Discharge Medication List as of 09/20/2016  7:14 PM       Margarita Mail, PA-C 09/20/16 [place], [place], MD 09/25/16 1898

## 2016-09-21 NOTE — Telephone Encounter (Signed)
LMTCB

## 2016-11-13 ENCOUNTER — Other Ambulatory Visit: Payer: Self-pay | Admitting: Internal Medicine

## 2016-11-13 ENCOUNTER — Other Ambulatory Visit: Payer: Medicare Other | Admitting: Internal Medicine

## 2016-11-13 DIAGNOSIS — E7800 Pure hypercholesterolemia, unspecified: Secondary | ICD-10-CM

## 2016-11-13 DIAGNOSIS — Z125 Encounter for screening for malignant neoplasm of prostate: Secondary | ICD-10-CM

## 2016-11-13 DIAGNOSIS — Z0000 Encounter for general adult medical examination without abnormal findings: Secondary | ICD-10-CM

## 2016-11-13 LAB — CBC WITH DIFFERENTIAL/PLATELET
BASOS ABS: 47 {cells}/uL (ref 0–200)
Basophils Relative: 1 %
EOS PCT: 4 %
Eosinophils Absolute: 188 cells/uL (ref 15–500)
HCT: 42 % (ref 38.5–50.0)
Hemoglobin: 14.1 g/dL (ref 13.2–17.1)
LYMPHS ABS: 752 {cells}/uL — AB (ref 850–3900)
Lymphocytes Relative: 16 %
MCH: 30.7 pg (ref 27.0–33.0)
MCHC: 33.6 g/dL (ref 32.0–36.0)
MCV: 91.5 fL (ref 80.0–100.0)
MONOS PCT: 12 %
MPV: 10.2 fL (ref 7.5–12.5)
Monocytes Absolute: 564 cells/uL (ref 200–950)
NEUTROS ABS: 3149 {cells}/uL (ref 1500–7800)
Neutrophils Relative %: 67 %
PLATELETS: 210 10*3/uL (ref 140–400)
RBC: 4.59 MIL/uL (ref 4.20–5.80)
RDW: 14.1 % (ref 11.0–15.0)
WBC: 4.7 10*3/uL (ref 3.8–10.8)

## 2016-11-13 LAB — COMPLETE METABOLIC PANEL WITH GFR
ALBUMIN: 4.1 g/dL (ref 3.6–5.1)
ALK PHOS: 58 U/L (ref 40–115)
ALT: 13 U/L (ref 9–46)
AST: 15 U/L (ref 10–35)
BILIRUBIN TOTAL: 0.3 mg/dL (ref 0.2–1.2)
BUN: 23 mg/dL (ref 7–25)
CO2: 25 mmol/L (ref 20–31)
Calcium: 8.9 mg/dL (ref 8.6–10.3)
Chloride: 103 mmol/L (ref 98–110)
Creat: 1 mg/dL (ref 0.70–1.33)
GFR, EST NON AFRICAN AMERICAN: 84 mL/min (ref >=60)
Glucose, Bld: 109 mg/dL — ABNORMAL HIGH (ref 65–99)
Potassium: 4.7 mmol/L (ref 3.5–5.3)
SODIUM: 138 mmol/L (ref 135–146)
TOTAL PROTEIN: 6.2 g/dL (ref 6.1–8.1)

## 2016-11-13 LAB — LIPID PANEL
CHOLESTEROL: 177 mg/dL (ref <200)
HDL: 51 mg/dL (ref >40)
LDL Cholesterol: 109 mg/dL — ABNORMAL HIGH (ref <100)
TRIGLYCERIDES: 84 mg/dL (ref <150)
Total CHOL/HDL Ratio: 3.5 Ratio (ref <5.0)
VLDL: 17 mg/dL (ref <30)

## 2016-11-14 LAB — PSA: PSA: 1 ng/mL (ref <=4.0)

## 2016-11-16 ENCOUNTER — Encounter: Payer: Self-pay | Admitting: Internal Medicine

## 2016-11-16 ENCOUNTER — Ambulatory Visit
Admission: RE | Admit: 2016-11-16 | Discharge: 2016-11-16 | Disposition: A | Discharge status: Home / Self Care | Payer: Medicare Other | Source: Ambulatory Visit | Attending: Internal Medicine | Admitting: Internal Medicine

## 2016-11-16 ENCOUNTER — Ambulatory Visit (INDEPENDENT_AMBULATORY_CARE_PROVIDER_SITE_OTHER): Payer: Medicare Other | Admitting: Internal Medicine

## 2016-11-16 VITALS — BP 108/80 | HR 74 | Ht 72.0 in | Wt 173.0 lb

## 2016-11-16 DIAGNOSIS — Z0000 Encounter for general adult medical examination without abnormal findings: Secondary | ICD-10-CM

## 2016-11-16 DIAGNOSIS — Z86010 Personal history of colonic polyps: Secondary | ICD-10-CM | POA: Diagnosis not present

## 2016-11-16 DIAGNOSIS — Z85831 Personal history of malignant neoplasm of soft tissue: Secondary | ICD-10-CM | POA: Diagnosis not present

## 2016-11-16 DIAGNOSIS — R1115 Cyclical vomiting syndrome unrelated to migraine: Secondary | ICD-10-CM

## 2016-11-16 DIAGNOSIS — E784 Other hyperlipidemia: Secondary | ICD-10-CM

## 2016-11-16 DIAGNOSIS — C419 Malignant neoplasm of bone and articular cartilage, unspecified: Secondary | ICD-10-CM

## 2016-11-16 DIAGNOSIS — E7849 Other hyperlipidemia: Secondary | ICD-10-CM

## 2016-11-16 DIAGNOSIS — Z89512 Acquired absence of left leg below knee: Secondary | ICD-10-CM

## 2016-11-16 DIAGNOSIS — M25551 Pain in right hip: Secondary | ICD-10-CM | POA: Diagnosis not present

## 2016-11-16 DIAGNOSIS — G43A1 Cyclical vomiting, intractable: Secondary | ICD-10-CM

## 2016-11-16 LAB — POCT URINALYSIS DIPSTICK
BILIRUBIN UA: NEGATIVE
Blood, UA: NEGATIVE
GLUCOSE UA: NEGATIVE
Ketones, UA: NEGATIVE
Leukocytes, UA: NEGATIVE
Nitrite, UA: NEGATIVE
Protein, UA: NEGATIVE
Spec Grav, UA: 1.015 (ref 1.010–1.025)
Urobilinogen, UA: 0.2 E.U./dL
pH, UA: 6.5 (ref 5.0–8.0)

## 2016-11-16 MED ORDER — OMEPRAZOLE 40 MG PO CPDR: 40.0000 mg | DELAYED_RELEASE_CAPSULE | Freq: Every day | ORAL | 3 refills | Status: DC

## 2016-11-16 MED ORDER — ONDANSETRON HCL 4 MG PO TABS: 4.0000 mg | ORAL_TABLET | Freq: Three times a day (TID) | ORAL | 0 refills | Status: DC | PRN

## 2016-11-16 MED ORDER — ONDANSETRON HCL 4 MG PO TABS
4.0000 mg | ORAL_TABLET | Freq: Three times a day (TID) | ORAL | 0 refills | Status: DC | PRN
Start: 2016-11-16 — End: 2016-11-16

## 2016-11-16 NOTE — Addendum Note (Signed)
Addended by: Elby Showers on: 11/16/2016 10:03 PM   Modules accepted: Level of Service

## 2016-11-16 NOTE — Progress Notes (Addendum)
Subjective:    Patient ID: Benjamin Valdez, male    DOB: [birth year], 56 y.o.   MRN: [id number]  HPI  56 year old White Male for health maintenance exam and evaluation of medical issues.He has a history of sarcoma left leg and is status post BKA  Receives disability benefits and is on Medicare. Needs Medicare wellness exam.  He has a history of cyclical vomiting and has to go to the emergency department from time to time to get antinausea medication and IV fluids.He treats this with Phenergan or Zofran and is on chronic Elavil therapy. Doesn't have migraine headache with the cyclical vomiting.  History of fractured clavicle  No known drug allergies  He has left lower extremity prosthesis and ambulates very well. He works out in [employer] quite a bit swims.  Social history: Lives with parents. Never married. Does not smoke. No alcohol consumption. Four-year college degree.  Family history: One brother with Crohn's disease, mother with osteoporosis hyperlipidemia atrial fibrillation on chronic anticoagulation, hip fracture, femur fracture, mitral regurgitation, history of MI and stent placement, controlled tight 2 diabetes mellitus who is 26 years old. His father in his 55s has a history of chronic back pain from lumbar stenosis, bladder cancer, diabetes mellitus, coronary stent placement, hyperlipidemia, hypertension, COPD and obstructive sleep apnea. One sister age 66 in good health. Sister's son has a history of drug addiction.  Past history of some depression treated with SSRI medication. He has a therapist that he sees.  Takes PPI for GE reflux  Colonoscopy January 2014 by [facility] endoscopy and needs repeat study soon. He needs to call them for appointment. He had 2 tubular adenomas on initial colonoscopy in 2014.  History of hyperlipidemia treated with Crestor.  He has an elevated serum glucose. Hemoglobin A1c will be added.  Has been having some right hip pain recently. He does  work out regularly and has started swimming again. Will x-ray right hip.  History of GE reflux and would like to have PPI refilled.  He has a history of sarcoma left lower extremity with resultant left BKA 1998. He is seen annually by [facility]. Montez Morita at [facility] regarding history of angiosarcoma of the left lower extremity. No history of recurrence.  He had an MRI of the left shoulder in 2012 showing a probable labral tear, significant before meals joint degenerative changes, cystic changes in the humeral head.  Normal nuclear stress test in 2009.     Review of Systems right hip pain recently. No known injury. History of internal and external hemorrhoids      Objective:   Physical Exam  Constitutional: He is oriented to person, place, and time. He appears well-developed and well-nourished. No distress.  HENT:  Head: Normocephalic and atraumatic.  Right Ear: External ear normal.  Left Ear: External ear normal.  Mouth/Throat: Oropharynx is clear and moist.  Eyes: Pupils are equal, round, and reactive to light. Conjunctivae and EOM are normal. Right eye exhibits no discharge. Left eye exhibits no discharge.  Neck: Neck supple. No JVD present. No thyromegaly present.  Cardiovascular: Normal rate, regular rhythm and normal heart sounds.   No murmur heard. Pulmonary/Chest: Effort normal and breath sounds normal. No respiratory distress. He has no wheezes. He has no rales.  Abdominal: He exhibits no distension and no mass. There is no tenderness. There is no rebound and no guarding.  Genitourinary: Prostate normal.  Musculoskeletal:  Status post left BKA with prosthesis  Lymphadenopathy:  He has no cervical adenopathy.  Neurological: He is alert and oriented to person, place, and time. He has normal reflexes. No cranial nerve deficit. Coordination normal.  Skin: Skin is warm and dry. No rash noted. He is not diaphoretic.  Psychiatric: He has a normal mood and affect. His  behavior is normal. Judgment and thought content normal.  Vitals reviewed.         Assessment & Plan:  Right hip pain-denies injury. In the back of his mind he's worried about recurrent sarcoma  Status post left BKA for sarcoma 1998-doing well  Hyperlipidemia-treated with statin medication and lipid panel is stable on this  History of cyclical vomiting-treated with Elavil and antinausea medication  Depression treated with SSRI  Elevated serum glucose-check hemoglobin A1c  Anxiety-treated with Valium   Recently seen for cyclical vomiting in emergency department June 2018 and drug test was positive for THC.   Subjective:   Patient presents for Medicare Annual/Subsequent preventive examination.  Review Past Medical/Family/Social:See above   Risk Factors  Current exercise habits: Works out at gym frequently Dietary issues discussed: Low fat and low carbohydrate  Cardiac risk factors:Hyperlipidemia and family history  Depression Screen  (Note: if answer to either of the following is "Yes", a more complete depression screening is indicated)   Over the past two weeks, have you felt down, depressed or hopeless? No  Over the past two weeks, have you felt little interest or pleasure in doing things? No Have you lost interest or pleasure in daily life? No Do you often feel hopeless? No Do you cry easily over simple problems? No   Activities of Daily Living  In your present state of health, do you have any difficulty performing the following activities?:   Driving? No  Managing money? No  Feeding yourself? No  Getting from bed to chair? No  Climbing a flight of stairs? No  Preparing food and eating?: No  Bathing or showering? No  Getting dressed: No  Getting to the toilet? No  Using the toilet:No  Moving around from place to place: No  In the past year have you fallen or had a near fall?Yes due to BKA Are you sexually active? yes Do you have more than one partner?  No   Hearing Difficulties: No  Do you often ask people to speak up or repeat themselves? No  Do you experience ringing or noises in your ears? No  Do you have difficulty understanding soft or whispered voices? No  Do you feel that you have a problem with memory? No Do you often misplace items? No    Home Safety:  Do you have a smoke alarm at your residence? Yes Do you have grab bars in the bathroom?Yes Do you have throw rugs in your house? No   Cognitive Testing  Alert? Yes Normal Appearance?Yes  Oriented to person? Yes Place? Yes  Time? Yes  Recall of three objects? Yes  Can perform simple calculations? Yes  Displays appropriate judgment?Yes  Can read the correct time from a watch face?Yes   List the Names of Other Physician/Practitioners you currently use:  See referral list for the physicians patient is currently seeing.     Review of Systems: See above   Objective:     General appearance: Appears stated age.  Head: Normocephalic, without obvious abnormality, atraumatic  Eyes: conj clear, EOMi PEERLA  Ears: normal TM's and external ear canals both ears  Nose: Nares normal. Septum midline. Mucosa normal. No drainage  or sinus tenderness.  Throat: lips, mucosa, and tongue normal; teeth and gums normal  Neck: no adenopathy, no carotid bruit, no JVD, supple, symmetrical, trachea midline and thyroid not enlarged, symmetric, no tenderness/mass/nodules  No CVA tenderness.  Lungs: clear to auscultation bilaterally  Breasts: normal appearance, no masses or tenderness Heart: regular rate and rhythm, S1, S2 normal, no murmur, click, rub or gallop  Abdomen: soft, non-tender; bowel sounds normal; no masses, no organomegaly  Musculoskeletal: ROM normal in all joints, no crepitus, no deformity, Normal muscle strengthen. Back  is symmetric, no curvature.No pain with internal  and external rotation of right hip Skin: Skin color, texture, turgor normal. No rashes or lesions  Lymph  nodes: Cervical, supraclavicular, and axillary nodes normal.  Neurologic: CN 2 -12 Normal, Normal symmetric reflexes. Normal coordination and gait  Psych: Alert & Oriented x 3, Mood appear stable.    Assessment:    Annual wellness medicare exam   Plan:    During the course of the visit the patient was educated and counseled about appropriate screening and preventive services including:   Annual PSA  Annual flu vaccine  Needs colonoscopy in the near future     Patient Instructions (the written plan) was given to the patient.  Medicare Attestation  I have personally reviewed:  The patient's medical and social history  Their use of alcohol, tobacco or illicit drugs  Their current medications and supplements  The patient's functional ability including ADLs,fall risks, home safety risks, cognitive, and hearing and visual impairment  Diet and physical activities  Evidence for depression or mood disorders  The patient's weight, height, BMI, and visual acuity have been recorded in the chart. I have made referrals, counseling, and provided education to the patient based on review of the above and I have provided the patient with a written personalized care plan for preventive services.

## 2016-11-16 NOTE — Patient Instructions (Signed)
Recommend colonoscopy in the near future. Please have right hip x-ray. Continue same meds. Hemoglobin A1c will be added to fasting labs. Return in one year or as needed.

## 2016-11-18 LAB — HEMOGLOBIN A1C
Hgb A1c MFr Bld: 5.7 % — ABNORMAL HIGH (ref <5.7)
Mean Plasma Glucose: 117 mg/dL

## 2016-12-03 ENCOUNTER — Other Ambulatory Visit: Payer: Self-pay | Admitting: Internal Medicine

## 2016-12-03 NOTE — Telephone Encounter (Signed)
#  100 Valium with NO refill

## 2017-01-05 ENCOUNTER — Other Ambulatory Visit: Payer: Self-pay | Admitting: Internal Medicine

## 2017-01-05 NOTE — Telephone Encounter (Signed)
LMTCB

## 2017-01-05 NOTE — Telephone Encounter (Signed)
Please call him. I think he is taking too much. Should not be out?

## 2017-01-06 NOTE — Telephone Encounter (Signed)
Pt takes between 0 to 6 tablets a day, he is completley out right now. Please advise

## 2017-01-06 NOTE — Telephone Encounter (Signed)
Refill once and make appt to discuss this please

## 2017-01-18 ENCOUNTER — Ambulatory Visit: Payer: Medicare Other | Admitting: Internal Medicine

## 2017-01-19 ENCOUNTER — Encounter: Payer: Self-pay | Admitting: Internal Medicine

## 2017-01-19 ENCOUNTER — Ambulatory Visit (INDEPENDENT_AMBULATORY_CARE_PROVIDER_SITE_OTHER): Payer: Medicare Other | Admitting: Internal Medicine

## 2017-01-19 VITALS — BP 102/70 | HR 78 | Temp 97.8°F | Wt 175.0 lb

## 2017-01-19 DIAGNOSIS — G43A0 Cyclical vomiting, not intractable: Secondary | ICD-10-CM | POA: Diagnosis not present

## 2017-01-19 DIAGNOSIS — Z89512 Acquired absence of left leg below knee: Secondary | ICD-10-CM

## 2017-01-19 DIAGNOSIS — F411 Generalized anxiety disorder: Secondary | ICD-10-CM | POA: Diagnosis not present

## 2017-02-09 ENCOUNTER — Other Ambulatory Visit: Payer: Self-pay | Admitting: Internal Medicine

## 2017-02-09 ENCOUNTER — Telehealth: Payer: Self-pay

## 2017-02-09 NOTE — Telephone Encounter (Signed)
Called in diazepam to patient's pharmacy   [facility] [place] [place] [address] [phone] (Phone) [phone] (Fax)

## 2017-02-09 NOTE — Telephone Encounter (Signed)
Refill once 

## 2017-02-13 DIAGNOSIS — F411 Generalized anxiety disorder: Secondary | ICD-10-CM | POA: Insufficient documentation

## 2017-02-13 NOTE — Progress Notes (Signed)
   Subjective:    Patient ID: Benjamin Valdez, male    DOB: 25-Jul-1960, 56 y.o.   MRN: [id number]  HPI 56 year old male status post left BKA  for sarcoma 1998.  History of cyclical vomiting.  Recently, was asked to refill Valium.  I thought he was taking more than usual.  Tells me that he is under some stress and has a long-standing history of anxiety.  Says he was started on Valium by Dr. Mare Ferrari who was his primary care physician and is now retired.  Says that he is concerned about what arrangements he will need to make when his parents are no longer able to stay in their own home.  He lives with him presently.  [place]  Says he will inherit one third of their estate and no more even though he has stayed there and taken care of them for a number of years.  Their health is fragile.  This worries him as well.  He is on Paxil 30 mg daily.  Valium has been prescribed as 5 mg every 8 hours as of 02/09/2017.  He takes melatonin as well as Elavil 50 mg at bedtime.   Review of Systems see above    Objective:   Physical Exam  Not examined but spent 25 minutes speaking with him about these issues.  I understand more about his anxiety disorder at the present time.  He does not want to go to counseling.      Assessment & Plan:  Anxiety-long-standing related to BKA for sarcoma  Situational stress with parents  History of cyclical vomiting  Plan: Return as needed and Valium has been refilled.  Generally seen once yearly and had physical exam in July.

## 2017-02-13 NOTE — Patient Instructions (Signed)
Continue Valium, Elavil, SSRI.  Return July 2019 for physical exam.

## 2017-02-21 ENCOUNTER — Other Ambulatory Visit: Payer: Self-pay | Admitting: Internal Medicine

## 2017-03-12 ENCOUNTER — Other Ambulatory Visit: Payer: Self-pay | Admitting: Internal Medicine

## 2017-03-12 NOTE — Telephone Encounter (Signed)
REFILL ONCE ONLY

## 2017-03-15 ENCOUNTER — Other Ambulatory Visit: Payer: Self-pay | Admitting: Internal Medicine

## 2017-03-15 NOTE — Telephone Encounter (Signed)
CALLED IN 

## 2017-03-15 NOTE — Telephone Encounter (Signed)
Refill once #100 tablets

## 2017-03-15 NOTE — Telephone Encounter (Signed)
Please make it clear this is a one time order for 100 tablets with NO refills

## 2017-03-19 ENCOUNTER — Other Ambulatory Visit: Payer: Self-pay

## 2017-03-19 ENCOUNTER — Encounter (HOSPITAL_COMMUNITY): Payer: Self-pay

## 2017-03-19 ENCOUNTER — Inpatient Hospital Stay (HOSPITAL_COMMUNITY)
Admission: EM | Admit: 2017-03-19 | Discharge: 2017-03-21 | DRG: 392 | Disposition: A | Discharge status: Home / Self Care | Payer: Medicare Other | Attending: Internal Medicine | Admitting: Internal Medicine

## 2017-03-19 DIAGNOSIS — F418 Other specified anxiety disorders: Secondary | ICD-10-CM | POA: Diagnosis present

## 2017-03-19 DIAGNOSIS — K219 Gastro-esophageal reflux disease without esophagitis: Secondary | ICD-10-CM | POA: Diagnosis present

## 2017-03-19 DIAGNOSIS — R1084 Generalized abdominal pain: Secondary | ICD-10-CM | POA: Diagnosis not present

## 2017-03-19 DIAGNOSIS — E7800 Pure hypercholesterolemia, unspecified: Secondary | ICD-10-CM | POA: Diagnosis present

## 2017-03-19 DIAGNOSIS — G43A1 Cyclical vomiting, intractable: Secondary | ICD-10-CM

## 2017-03-19 DIAGNOSIS — R112 Nausea with vomiting, unspecified: Secondary | ICD-10-CM | POA: Diagnosis not present

## 2017-03-19 DIAGNOSIS — R109 Unspecified abdominal pain: Secondary | ICD-10-CM | POA: Diagnosis present

## 2017-03-19 DIAGNOSIS — E876 Hypokalemia: Secondary | ICD-10-CM | POA: Diagnosis present

## 2017-03-19 DIAGNOSIS — Z89512 Acquired absence of left leg below knee: Secondary | ICD-10-CM

## 2017-03-19 DIAGNOSIS — F1210 Cannabis abuse, uncomplicated: Secondary | ICD-10-CM | POA: Diagnosis present

## 2017-03-19 DIAGNOSIS — I10 Essential (primary) hypertension: Secondary | ICD-10-CM | POA: Diagnosis present

## 2017-03-19 DIAGNOSIS — Z85830 Personal history of malignant neoplasm of bone: Secondary | ICD-10-CM

## 2017-03-19 DIAGNOSIS — Z79899 Other long term (current) drug therapy: Secondary | ICD-10-CM

## 2017-03-19 LAB — URINALYSIS, ROUTINE W REFLEX MICROSCOPIC
Bilirubin Urine: NEGATIVE
GLUCOSE, UA: NEGATIVE mg/dL
Hgb urine dipstick: NEGATIVE
KETONES UR: 5 mg/dL — AB
LEUKOCYTES UA: NEGATIVE
Nitrite: NEGATIVE
PROTEIN: NEGATIVE mg/dL
Specific Gravity, Urine: 1.024 (ref 1.005–1.030)
pH: 6 (ref 5.0–8.0)

## 2017-03-19 LAB — CBC
HCT: 42.4 % (ref 39.0–52.0)
Hemoglobin: 14.7 g/dL (ref 13.0–17.0)
MCH: 31.1 pg (ref 26.0–34.0)
MCHC: 34.7 g/dL (ref 30.0–36.0)
MCV: 89.8 fL (ref 78.0–100.0)
PLATELETS: 233 10*3/uL (ref 150–400)
RBC: 4.72 MIL/uL (ref 4.22–5.81)
RDW: 12.9 % (ref 11.5–15.5)
WBC: 11.6 10*3/uL — AB (ref 4.0–10.5)

## 2017-03-19 LAB — RAPID URINE DRUG SCREEN, HOSP PERFORMED
Amphetamines: NOT DETECTED
BARBITURATES: NOT DETECTED
Benzodiazepines: POSITIVE — AB
COCAINE: NOT DETECTED
Opiates: NOT DETECTED
TETRAHYDROCANNABINOL: POSITIVE — AB

## 2017-03-19 LAB — COMPREHENSIVE METABOLIC PANEL
ALK PHOS: 77 U/L (ref 38–126)
ALT: 16 U/L — AB (ref 17–63)
AST: 25 U/L (ref 15–41)
Albumin: 4.8 g/dL (ref 3.5–5.0)
Anion gap: 13 (ref 5–15)
BILIRUBIN TOTAL: 0.6 mg/dL (ref 0.3–1.2)
BUN: 30 mg/dL — AB (ref 6–20)
CALCIUM: 10.1 mg/dL (ref 8.9–10.3)
CO2: 23 mmol/L (ref 22–32)
CREATININE: 1.11 mg/dL (ref 0.61–1.24)
Chloride: 106 mmol/L (ref 101–111)
Glucose, Bld: 177 mg/dL — ABNORMAL HIGH (ref 65–99)
Potassium: 4.1 mmol/L (ref 3.5–5.1)
Sodium: 142 mmol/L (ref 135–145)
TOTAL PROTEIN: 7.6 g/dL (ref 6.5–8.1)

## 2017-03-19 LAB — LIPASE, BLOOD: Lipase: 57 U/L — ABNORMAL HIGH (ref 11–51)

## 2017-03-19 MED ORDER — HYDRALAZINE HCL 20 MG/ML IJ SOLN: 5.0000 mg | INTRAMUSCULAR | Status: DC | PRN

## 2017-03-19 MED ORDER — PROCHLORPERAZINE EDISYLATE 5 MG/ML IJ SOLN
10.0000 mg | Freq: Once | INTRAMUSCULAR | Status: AC
  Administered 2017-03-19: 10 mg via INTRAMUSCULAR
  Filled 2017-03-19: qty 2

## 2017-03-19 MED ORDER — AMITRIPTYLINE HCL 50 MG PO TABS
50.0000 mg | ORAL_TABLET | Freq: Every evening | ORAL | Status: DC | PRN
  Administered 2017-03-19: 50 mg via ORAL
  Filled 2017-03-19: qty 1

## 2017-03-19 MED ORDER — DIAZEPAM 5 MG PO TABS
5.0000 mg | ORAL_TABLET | Freq: Three times a day (TID) | ORAL | Status: DC | PRN
  Administered 2017-03-20: 5 mg via ORAL
  Filled 2017-03-19: qty 1

## 2017-03-19 MED ORDER — METOCLOPRAMIDE HCL 5 MG/ML IJ SOLN
INTRAMUSCULAR | Status: AC
  Filled 2017-03-19: qty 2

## 2017-03-19 MED ORDER — DIPHENHYDRAMINE HCL 50 MG/ML IJ SOLN
25.0000 mg | Freq: Once | INTRAMUSCULAR | Status: AC
  Administered 2017-03-19: 25 mg via INTRAVENOUS
  Filled 2017-03-19: qty 1

## 2017-03-19 MED ORDER — ONDANSETRON HCL 4 MG/2ML IJ SOLN
4.0000 mg | Freq: Three times a day (TID) | INTRAMUSCULAR | Status: DC | PRN
Start: 2017-03-19 — End: 2017-03-21
  Administered 2017-03-19: 4 mg via INTRAVENOUS
  Filled 2017-03-19: qty 2

## 2017-03-19 MED ORDER — MELATONIN 10 MG PO TABS: 1.0000 | ORAL_TABLET | Freq: Every day | ORAL | Status: DC

## 2017-03-19 MED ORDER — ACETAMINOPHEN 650 MG RE SUPP: 650.0000 mg | Freq: Four times a day (QID) | RECTAL | Status: DC | PRN

## 2017-03-19 MED ORDER — METOCLOPRAMIDE HCL 5 MG/ML IJ SOLN
5.0000 mg | Freq: Three times a day (TID) | INTRAMUSCULAR | Status: DC
  Administered 2017-03-19 – 2017-03-21 (×5): 5 mg via INTRAVENOUS
  Filled 2017-03-19 (×5): qty 2

## 2017-03-19 MED ORDER — FAMOTIDINE IN NACL 20-0.9 MG/50ML-% IV SOLN
INTRAVENOUS | Status: AC
  Filled 2017-03-19: qty 50

## 2017-03-19 MED ORDER — PAROXETINE HCL 30 MG PO TABS
45.0000 mg | ORAL_TABLET | Freq: Every day | ORAL | Status: DC
  Filled 2017-03-19: qty 1.5

## 2017-03-19 MED ORDER — AMITRIPTYLINE HCL 25 MG PO TABS: 50.0000 mg | ORAL_TABLET | Freq: Every day | ORAL | Status: DC

## 2017-03-19 MED ORDER — SODIUM CHLORIDE 0.9 % IV SOLN
INTRAVENOUS | Status: DC
  Administered 2017-03-19: 20:00:00 via INTRAVENOUS

## 2017-03-19 MED ORDER — SODIUM CHLORIDE 0.9 % IV BOLUS (SEPSIS)
1000.0000 mL | Freq: Once | INTRAVENOUS | Status: AC
  Administered 2017-03-19: 1000 mL via INTRAVENOUS

## 2017-03-19 MED ORDER — PROMETHAZINE HCL 25 MG/ML IJ SOLN
25.0000 mg | Freq: Once | INTRAMUSCULAR | Status: AC
  Administered 2017-03-19: 25 mg via INTRAVENOUS
  Filled 2017-03-19: qty 1

## 2017-03-19 MED ORDER — ACETAMINOPHEN 325 MG PO TABS: 650.0000 mg | ORAL_TABLET | Freq: Four times a day (QID) | ORAL | Status: DC | PRN

## 2017-03-19 MED ORDER — PROMETHAZINE HCL 25 MG RE SUPP: 25.0000 mg | Freq: Four times a day (QID) | RECTAL | 0 refills | Status: DC | PRN

## 2017-03-19 MED ORDER — ZOLPIDEM TARTRATE 5 MG PO TABS
5.0000 mg | ORAL_TABLET | Freq: Every evening | ORAL | Status: DC | PRN
  Administered 2017-03-20 (×2): 5 mg via ORAL
  Filled 2017-03-19 (×2): qty 1

## 2017-03-19 MED ORDER — FENTANYL CITRATE (PF) 100 MCG/2ML IJ SOLN
50.0000 ug | Freq: Once | INTRAMUSCULAR | Status: AC
  Administered 2017-03-19: 50 ug via INTRAVENOUS
  Filled 2017-03-19: qty 2

## 2017-03-19 MED ORDER — FAMOTIDINE IN NACL 20-0.9 MG/50ML-% IV SOLN
20.0000 mg | Freq: Two times a day (BID) | INTRAVENOUS | Status: DC
  Administered 2017-03-19 – 2017-03-21 (×4): 20 mg via INTRAVENOUS
  Filled 2017-03-19 (×4): qty 50

## 2017-03-19 MED ORDER — ONDANSETRON HCL 4 MG/2ML IJ SOLN
4.0000 mg | Freq: Once | INTRAMUSCULAR | Status: AC
  Administered 2017-03-19: 4 mg via INTRAVENOUS
  Filled 2017-03-19: qty 2

## 2017-03-19 MED ORDER — ROSUVASTATIN CALCIUM 20 MG PO TABS
20.0000 mg | ORAL_TABLET | Freq: Every day | ORAL | Status: DC
  Administered 2017-03-20 – 2017-03-21 (×2): 20 mg via ORAL
  Filled 2017-03-19 (×2): qty 1

## 2017-03-19 MED ORDER — ENOXAPARIN SODIUM 40 MG/0.4ML ~~LOC~~ SOLN
40.0000 mg | SUBCUTANEOUS | Status: DC
  Administered 2017-03-20: 40 mg via SUBCUTANEOUS
  Filled 2017-03-19 (×2): qty 0.4

## 2017-03-19 MED ORDER — PROMETHAZINE HCL 25 MG RE SUPP
25.0000 mg | Freq: Once | RECTAL | Status: AC
  Administered 2017-03-19: 25 mg via RECTAL
  Filled 2017-03-19: qty 1

## 2017-03-19 MED ORDER — PAROXETINE HCL 20 MG PO TABS
45.0000 mg | ORAL_TABLET | Freq: Every day | ORAL | Status: DC
  Administered 2017-03-20 – 2017-03-21 (×2): 45 mg via ORAL
  Filled 2017-03-19 (×5): qty 2

## 2017-03-19 NOTE — ED Notes (Signed)
Patient provided with ginger ale for PO challenge. Will reassess.

## 2017-03-19 NOTE — H&P (Signed)
History and Physical    Fabien Travelstead SFK:[id number] DOB: June 23, 1960 DOA: 03/19/2017  Referring MD/NP/PA:   PCP: Elby Showers, MD   Patient coming from:  The patient is coming from home.  At baseline, pt is independent for most of ADL.   Chief Complaint: Intractable nausea and vomiting and abdominal pain  HPI: Charistopher Rumble is a 56 y.o. male with medical history significant of hypertension, hyperlipidemia, GERD, depression with anxiety, marijuana abuse, gastritis, angiosarcoma (s/p of left BKA), who presents with intractable nausea, vomiting abdominal pain  Patient states that he has been having severe nausea and vomiting today, which has been progressively getting worse. He vomited more than 20 times without blood in the vomitus. No diarrhea. He also has abdominal pain, which is moderate, located in the central abdomen, constant, nonradiating. No fever or chills. Patient states that he used marijuana 3 days ago. Patient does not have chest pain, SOB, cough, symptoms of UTI or unilateral weakness.   ED Course: pt was found to have WBC 11.6, lipase 57, negative urinalysis, creatinine 1.11, potassium normal, temperature normal, heart rate 90s, no tachypnea, oxygen saturation 100% on room air. Patient is placed on MedSurg bed for observation.  Review of Systems:   General: no fevers, chills, no body weight gain, has poor appetite, has fatigue HEENT: no blurry vision, hearing changes or sore throat Respiratory: no dyspnea, coughing, wheezing CV: no chest pain, no palpitations GI: has nausea, vomiting, abdominal pain, no diarrhea, constipation GU: no dysuria, burning on urination, increased urinary frequency, hematuria  Ext: no leg edema Neuro: no unilateral weakness, numbness, or tingling, no vision change or hearing loss Skin: no rash, no skin tear. MSK: No muscle spasm, no deformity, no limitation of range of movement in spin Heme: No easy bruising.  Travel history: No  recent long distant travel.  Allergy: No Known Allergies  Past Medical History:  Diagnosis Date  . 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 Bone cancer ([place])    has left BK amputation  . Chest pain April 2010   Myocardial Infarction ruled out  . Dyslipidemia   . Essential hypertension   . Gastritis April 2010   Acute--Resolved  . Hiatal hernia   . Marijuana abuse    History of Marijuana use  . Nausea alone    Recurrent  . Neck problem   . Vomiting     Past Surgical History:  Procedure Laterality Date  . AMPUTATION     Left leg below the knee   . APPENDECTOMY    . CERVICAL LAMINECTOMY      Social History:  reports that  has never smoked. he has never used smokeless tobacco. He reports that he uses drugs. Drug: Marijuana. He reports that he does not drink alcohol.  Family History:  Family History  Problem Relation Age of Onset  . Diabetes Mother   . Bladder Cancer Father   . Crohn's disease Brother      Prior to Admission medications   Medication Sig Start Date End Date Taking? Authorizing Provider  amitriptyline (ELAVIL) 50 MG tablet Take 50-100 mg by mouth at bedtime.    Yes [provider]  diazepam (VALIUM) 5 MG tablet TAKE 1 TABLET BY MOUTH EVERY 8 HOURS AS NEEDED FOR ANXIETY 03/15/17  Yes Baxley, Cresenciano Lick, MD  Melatonin 10 MG TABS Take 1 tablet by mouth at bedtime.   Yes [provider]  omeprazole (PRILOSEC) 40 MG capsule Take 1 capsule (  40 mg total) by mouth daily. 11/16/16  Yes Baxley, Cresenciano Lick, MD  PARoxetine (PAXIL) 30 MG tablet Take 45 mg by mouth every morning.   Yes [provider]  rosuvastatin (CRESTOR) 20 MG tablet TAKE ONE TABLET BY MOUTH ONE TIME DAILY. 02/22/17  Yes Baxley, Cresenciano Lick, MD  ondansetron (ZOFRAN) 4 MG tablet Take 1 tablet (4 mg total) by mouth every 8 (eight) hours as needed for nausea or vomiting. Patient not taking: Reported on 01/19/2017 11/16/16   Elby Showers, MD  promethazine (PHENERGAN) 25 MG suppository  Place 1 suppository (25 mg total) rectally every 6 (six) hours as needed for nausea or vomiting. 03/19/17   Ward, Ozella Almond, PA-C    Physical Exam: Vitals:   03/19/17 1630 03/19/17 1830 03/19/17 1851 03/19/17 1900  BP: (!) 174/109 (!) 157/91 (!) 157/91 (!) 156/95  Pulse: 89 93 91 93  Resp:   16   Temp:      TempSrc:      SpO2: 97% 100% 96% 100%  Weight:      Height:       General: Not in acute distress HEENT:       Eyes: PERRL, EOMI, no scleral icterus.       ENT: No discharge from the ears and nose, no pharynx injection, no tonsillar enlargement.        Neck: No JVD, no bruit, no mass felt. Heme: No neck lymph node enlargement. Cardiac: S1/S2, RRR, No murmurs, No gallops or rubs. Respiratory: No rales, wheezing, rhonchi or rubs. GI: Soft, nondistended, has tenderness diffusely, no rebound pain, no organomegaly, BS present. GU: No hematuria Ext: No pitting leg edema bilaterally. 2+DP/PT pulse bilaterally. Musculoskeletal: No joint deformities, No joint redness or warmth, no limitation of ROM in spin. Skin: No rashes.  Neuro: Alert, oriented X3, cranial nerves II-XII grossly intact, moves all extremities normally. Psych: Patient is not psychotic, no suicidal or hemocidal ideation.  Labs on Admission: I have personally reviewed following labs and imaging studies  CBC: Recent Labs  Lab 03/19/17 1137  WBC 11.6*  HGB 14.7  HCT 42.4  MCV 89.8  PLT 161   Basic Metabolic Panel: Recent Labs  Lab 03/19/17 1137  NA 142  K 4.1  CL 106  CO2 23  GLUCOSE 177*  BUN 30*  CREATININE 1.11  CALCIUM 10.1   GFR: Estimated Creatinine Clearance: 82.5 mL/min (by C-G formula based on SCr of 1.11 mg/dL). Liver Function Tests: Recent Labs  Lab 03/19/17 1137  AST 25  ALT 16*  ALKPHOS 77  BILITOT 0.6  PROT 7.6  ALBUMIN 4.8   Recent Labs  Lab 03/19/17 1137  LIPASE 57*   No results for input(s): AMMONIA in the last 168 hours. Coagulation Profile: No results for  input(s): INR, PROTIME in the last 168 hours. Cardiac Enzymes: No results for input(s): CKTOTAL, CKMB, CKMBINDEX, TROPONINI in the last 168 hours. BNP (last 3 results) No results for input(s): PROBNP in the last 8760 hours. HbA1C: No results for input(s): HGBA1C in the last 72 hours. CBG: No results for input(s): GLUCAP in the last 168 hours. Lipid Profile: No results for input(s): CHOL, HDL, LDLCALC, TRIG, CHOLHDL, LDLDIRECT in the last 72 hours. Thyroid Function Tests: No results for input(s): TSH, T4TOTAL, FREET4, T3FREE, THYROIDAB in the last 72 hours. Anemia Panel: No results for input(s): VITAMINB12, FOLATE, FERRITIN, TIBC, IRON, RETICCTPCT in the last 72 hours. Urine analysis:    Component Value Date/Time   COLORURINE YELLOW 03/19/2017  [place] 03/19/2017 1500   LABSPEC 1.024 03/19/2017 1500   PHURINE 6.0 03/19/2017 1500   GLUCOSEU NEGATIVE 03/19/2017 1500   HGBUR NEGATIVE 03/19/2017 1500   BILIRUBINUR NEGATIVE 03/19/2017 1500   BILIRUBINUR NEG 11/16/2016 1412   KETONESUR 5 (A) 03/19/2017 1500   PROTEINUR NEGATIVE 03/19/2017 1500   UROBILINOGEN 0.2 11/16/2016 1412   UROBILINOGEN 0.2 06/29/2011 0918   NITRITE NEGATIVE 03/19/2017 1500   LEUKOCYTESUR NEGATIVE 03/19/2017 1500   Sepsis Labs: @LABRCNTIP (procalcitonin:4,lacticidven:4) )No results found for this or any previous visit (from the past 240 hour(s)).   Radiological Exams on Admission: No results found.   EKG: Not done in ED, will get one.   Assessment/Plan Principal Problem:   Intractable nausea and vomiting Active Problems:   Hypercholesterolemia   GERD (gastroesophageal reflux disease)   Depression with anxiety   Abdominal pain   Intractable nausea and vomiting and abdominal pain; etiology is not clear. Marijuana abuse is a potential etiology.  Lipase is minimally elevated at 57 and LFT normal, not seem to have pancreatitis, which is probably due to severe nausea and vomiting. Other  differential diagnoses include viral gastritis and gastroparesis.  -w placed on MedSurg bed for observation -Scheduled Reglan, when necessary Zofran -IV fluids: 2 L normal saline, followed by 125 mL per hour -Follow-up CT abdomen/pelvis to rule out obstruction -check UDS  HTN: Patient is not taking medications at home. Blood pressure is 156/95 -When necessary hydralazine  Hypercholesterolemia: -crestor  GERD: -Pepcid IV  Depression and anxiety: Stable, no suicidal or homicidal ideations. -Continue home medications: Amitriptyline, Paxil   DVT ppx: sQ Lovenox Code Status: Full code Family Communication: None at bed side.     Disposition Plan:  Anticipate discharge back to previous home environment Consults called:  none Admission status:   medical floor/obs      Date of Service 03/19/2017    Ivor Costa [facility] Pager [phone]  If 7PM-7AM, please contact night-coverage [email or web address] Password [facility] 03/19/2017, 8:17 PM

## 2017-03-19 NOTE — ED Notes (Signed)
This nurse administered PR phenergan. Patient states he refuses to leave at this time and insists to be admitted under observation unless he feels better. EDPA notified.

## 2017-03-19 NOTE — ED Notes (Signed)
Patient shivering and vomiting again, despite suppository. EDPA notified. Patient requesting to be admitted.

## 2017-03-19 NOTE — ED Triage Notes (Signed)
Patient reports a history of cyclic vomiting and has been vomiting x 3 hours.

## 2017-03-19 NOTE — ED Notes (Signed)
Pt continues to be nauseated, he is pale and diaphoretic.  Gave reglan at this time as well as pepcid to hopefully help him feel more comfortable.

## 2017-03-19 NOTE — ED Provider Notes (Signed)
[facility] DEPT Provider Note   CSN: [id number] Arrival date & time: 03/19/17  1033     History   Chief Complaint Chief Complaint  Patient presents with  . Emesis    HPI Benjamin Valdez is a 56 y.o. male.  Benjamin Valdez is a 56 y.o. Male who presents to the ED complaining of nausea and vomiting starting today. He reports he started having nausea and vomiting starting today around 7 am. He tried taking a valium today without relief. He has a history of cyclic vomiting and reports this feels the same. He reports starting to have generalized abdominal pain that started after he began vomiting today. Previous abdominal surgeries includes an appendectomy. He does use THC and last smoked 3 days ago. He denies alcohol use. He denies fevers, hematemesis, diarrhea, urinary symptoms, chest pain, coughing, shortness of breath or rashes.   The history is provided by the patient and medical records. No language interpreter was used.  Emesis   Associated symptoms include abdominal pain. Pertinent negatives include no chills, no cough, no diarrhea, no fever and no headaches.    Past Medical History:  Diagnosis Date  . 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 Bone cancer ([place])    has left BK amputation  . Chest pain April 2010   Myocardial Infarction ruled out  . Dyslipidemia   . Essential hypertension   . Gastritis April 2010   Acute--Resolved  . Hiatal hernia   . Marijuana abuse    History of Marijuana use  . Nausea alone    Recurrent  . Neck problem   . Vomiting     Patient Active Problem List   Diagnosis Date Noted  . GAD (generalized anxiety disorder) 02/13/2017  . Hx of BKA, left ([place]) 11/16/2016  . Hypercholesterolemia 01/28/2011  . Bone sarcoma ([place]) 01/28/2011    Past Surgical History:  Procedure Laterality Date  . AMPUTATION     Left leg below the knee   . APPENDECTOMY    . CERVICAL LAMINECTOMY         Home  Medications    Prior to Admission medications   Medication Sig Start Date End Date Taking? Authorizing Provider  amitriptyline (ELAVIL) 50 MG tablet Take 50-100 mg by mouth at bedtime.    Yes [provider]  diazepam (VALIUM) 5 MG tablet TAKE 1 TABLET BY MOUTH EVERY 8 HOURS AS NEEDED FOR ANXIETY 03/15/17  Yes Baxley, Cresenciano Lick, MD  Melatonin 10 MG TABS Take 1 tablet by mouth at bedtime.   Yes [provider]  omeprazole (PRILOSEC) 40 MG capsule Take 1 capsule (40 mg total) by mouth daily. 11/16/16  Yes Baxley, Cresenciano Lick, MD  PARoxetine (PAXIL) 30 MG tablet Take 45 mg by mouth every morning.   Yes [provider]  rosuvastatin (CRESTOR) 20 MG tablet TAKE ONE TABLET BY MOUTH ONE TIME DAILY. 02/22/17  Yes Baxley, Cresenciano Lick, MD  ondansetron (ZOFRAN) 4 MG tablet Take 1 tablet (4 mg total) by mouth every 8 (eight) hours as needed for nausea or vomiting. Patient not taking: Reported on 01/19/2017 11/16/16   Elby Showers, MD    Family History Family History  Problem Relation Age of Onset  . Diabetes Mother   . Bladder Cancer Father   . Crohn's disease Brother     Social History Social History   Tobacco Use  . Smoking status: Never Smoker  . Smokeless tobacco: Never Used  Substance  Use Topics  . Alcohol use: No  . Drug use: Yes    Types: Marijuana    Comment: 3 times a week     Allergies   Patient has no known allergies.   Review of Systems Review of Systems  Constitutional: Negative for chills and fever.  HENT: Negative for congestion and sore throat.   Eyes: Negative for visual disturbance.  Respiratory: Negative for cough and shortness of breath.   Cardiovascular: Negative for chest pain.  Gastrointestinal: Positive for abdominal pain, nausea and vomiting. Negative for diarrhea.  Genitourinary: Negative for dysuria.  Musculoskeletal: Negative for back pain and neck pain.  Skin: Negative for rash.  Neurological: Negative for headaches.     Physical  Exam Updated Vital Signs BP (!) 145/87   Pulse 85   Temp (!) 97.3 F (36.3 C) (Oral)   Resp 16   Ht 6' (1.829 m)   Wt 81.6 kg (180 lb)   SpO2 100%   BMI 24.41 kg/m   Physical Exam  Constitutional: He appears well-developed and well-nourished. No distress.  Nontoxic-appearing.  HENT:  Head: Normocephalic and atraumatic.  Eyes: Right eye exhibits no discharge. Left eye exhibits no discharge.  Neck: Neck supple.  Cardiovascular: Normal rate, regular rhythm, normal heart sounds and intact distal pulses. Exam reveals no gallop and no friction rub.  No murmur heard. Pulmonary/Chest: Effort normal and breath sounds normal. No respiratory distress. He has no wheezes. He has no rales.  Abdominal: Soft. Bowel sounds are normal. He exhibits no distension. There is tenderness. There is no guarding.  Abdomen is soft.  Bowel sounds are present.  Patient has generalized abdominal tenderness to palpation without focal tenderness.  Patient is retching in room.  Nonbilious and nonbloody emesis noted.  Musculoskeletal: He exhibits no edema.  Lymphadenopathy:    He has no cervical adenopathy.  Neurological: He is alert. Coordination normal.  Skin: Skin is warm and dry. No rash noted. He is not diaphoretic. No erythema. No pallor.  Psychiatric: He has a normal mood and affect. His behavior is normal.  Nursing note and vitals reviewed.    ED Treatments / Results  Labs (all labs ordered are listed, but only abnormal results are displayed) Labs Reviewed  LIPASE, BLOOD - Abnormal; Notable for the following components:      Result Value   Lipase 57 (*)    All other components within normal limits  COMPREHENSIVE METABOLIC PANEL - Abnormal; Notable for the following components:   Glucose, Bld 177 (*)    BUN 30 (*)    ALT 16 (*)    All other components within normal limits  CBC - Abnormal; Notable for the following components:   WBC 11.6 (*)    All other components within normal limits    URINALYSIS, ROUTINE W REFLEX MICROSCOPIC - Abnormal; Notable for the following components:   Ketones, ur 5 (*)    All other components within normal limits    EKG  EKG Interpretation None       Radiology No results found.  Procedures Procedures (including critical care time)  Medications Ordered in ED Medications  prochlorperazine (COMPAZINE) injection 10 mg (10 mg Intramuscular Given 03/19/17 1224)  diphenhydrAMINE (BENADRYL) injection 25 mg (25 mg Intravenous Given 03/19/17 1237)  sodium chloride 0.9 % bolus 1,000 mL (0 mLs Intravenous Stopped 03/19/17 1334)  ondansetron (ZOFRAN) injection 4 mg (4 mg Intravenous Given 03/19/17 1347)  sodium chloride 0.9 % bolus 1,000 mL (0 mLs Intravenous Stopped 03/19/17  1438)  fentaNYL (SUBLIMAZE) injection 50 mcg (50 mcg Intravenous Given 03/19/17 1501)  promethazine (PHENERGAN) injection 25 mg (25 mg Intravenous Given 03/19/17 1502)  sodium chloride 0.9 % bolus 1,000 mL (1,000 mLs Intravenous New Bag/Given 03/19/17 1501)     Initial Impression / Assessment and Plan / ED Course  I have reviewed the triage vital signs and the nursing notes.  Pertinent labs & imaging results that were available during my care of the patient were reviewed by me and considered in my medical decision making (see chart for details).  This is a 56 y.o. Male who presents to the ED complaining of nausea and vomiting starting today. He reports he started having nausea and vomiting starting today around 7 am. He tried taking a valium today without relief. He has a history of cyclic vomiting and reports this feels the same. He reports starting to have generalized abdominal pain that started after he began vomiting today. Previous abdominal surgeries includes an appendectomy. He does use THC and last smoked 3 days ago. He denies alcohol use.  On exam the patient is afebrile and nontoxic-appearing.  He is retching on initial examination has nonbilious nonbloody emesis.   His abdomen is mildly generally tender to palpation without areas of focal tenderness. Urinalysis is without sign of infection.  CMP is unremarkable.  CBC is remarkable only for mild leukocytosis with a white count of 11,600 which is likely related to his vomiting.  Lipase is mildly elevated at 57.  I also suspect this is related to his vomiting.   After several rounds of fluids and nausea medication patient reports he is feeling much better at reevaluation.  He has no abdominal pain.  He is no longer vomiting.  He feels ready for discharge.  I discussed the mildly elevated lipase.  He has no history of pancreatitis.  We discussed reasons to return to the emergency department.  He tells me he does not need any refills on any medications or medications at discharge.  Return precautions discussed. I advised the patient to follow-up with their primary care provider this week. I advised the patient to return to the emergency department with new or worsening symptoms or new concerns. The patient verbalized understanding and agreement with plan.     Final Clinical Impressions(s) / ED Diagnoses   Final diagnoses:  Non-intractable vomiting with nausea, unspecified vomiting type    ED Discharge Orders    None       Waynetta Pean, PA-C 03/19/17 [place], Wenda Overland, MD 03/25/17 2150

## 2017-03-19 NOTE — Progress Notes (Signed)
PHARMACIST - PHYSICIAN ORDER COMMUNICATION  CONCERNING: P&T Medication Policy on Herbal Medications  DESCRIPTION:  This patient's order for:  Melatonin  has been noted.  This product(s) is classified as an "herbal" or natural product. Due to a lack of definitive safety studies or FDA approval, nonstandard manufacturing practices, plus the potential risk of unknown drug-drug interactions while on inpatient medications, the Pharmacy and Therapeutics Committee does not permit the use of "herbal" or natural products of this type within [facility].   ACTION TAKEN: The pharmacy department is unable to verify this order at this time and your patient has been informed of this safety policy. Please reevaluate patient's clinical condition at discharge and address if the herbal or natural product(s) should be resumed at that time.  Reuel Boom, PharmD, BCPS Pager: [phone] 03/19/2017, 9:26 PM

## 2017-03-19 NOTE — ED Notes (Addendum)
Patient refused zofran ODT when offered. Patient stated, "It never helps."

## 2017-03-19 NOTE — ED Notes (Signed)
Patient states "I threw up my ginger ale. I think I need another bag of fluids and more anti- nausea medicine before I can be discharged." This nurse explained to patient that the physician had discharged him because he felt the patient was safe enough to go home, and that with the patient's history of cyclic vomiting, it might take a while for all of his symptoms to subside. The patient disagrees and states he needs to speak with a doctor before being discharged. EDPA notified. EDPA to bedside.

## 2017-03-19 NOTE — ED Provider Notes (Signed)
Patient seen today by previous provider for cyclic n/v. He was discharged, however after morning PA left and patient was receiving discharge instructions, he began vomiting once again. I was asked to re-evaluate patient. He notes feeling somewhat better earlier, but concerned that symptoms have started back so suddenly. He reports that he typically will improve with treatment provided in ED and he is quite concerned about getting discharged home. Will give another dose of phenergan and re-evaluate.   Patient received phenergan and attempted PO challenge. Multiple episodes of emesis in bucket at bedside on re-evaluation. Hospitalist consulted who will admit.    Arienne Gartin, Ozella Almond, PA-C 03/19/17 1936    Milton Ferguson, MD 03/19/17 [phone]

## 2017-03-20 ENCOUNTER — Observation Stay (HOSPITAL_COMMUNITY): Payer: Medicare Other

## 2017-03-20 DIAGNOSIS — E7800 Pure hypercholesterolemia, unspecified: Secondary | ICD-10-CM | POA: Diagnosis present

## 2017-03-20 DIAGNOSIS — R112 Nausea with vomiting, unspecified: Principal | ICD-10-CM

## 2017-03-20 DIAGNOSIS — Z85830 Personal history of malignant neoplasm of bone: Secondary | ICD-10-CM | POA: Diagnosis not present

## 2017-03-20 DIAGNOSIS — F418 Other specified anxiety disorders: Secondary | ICD-10-CM | POA: Diagnosis present

## 2017-03-20 DIAGNOSIS — E876 Hypokalemia: Secondary | ICD-10-CM | POA: Diagnosis present

## 2017-03-20 DIAGNOSIS — I10 Essential (primary) hypertension: Secondary | ICD-10-CM | POA: Diagnosis present

## 2017-03-20 DIAGNOSIS — F1210 Cannabis abuse, uncomplicated: Secondary | ICD-10-CM | POA: Diagnosis present

## 2017-03-20 DIAGNOSIS — K219 Gastro-esophageal reflux disease without esophagitis: Secondary | ICD-10-CM | POA: Diagnosis present

## 2017-03-20 DIAGNOSIS — Z89512 Acquired absence of left leg below knee: Secondary | ICD-10-CM | POA: Diagnosis not present

## 2017-03-20 DIAGNOSIS — Z79899 Other long term (current) drug therapy: Secondary | ICD-10-CM | POA: Diagnosis not present

## 2017-03-20 LAB — CBC
HCT: 38.1 % — ABNORMAL LOW (ref 39.0–52.0)
Hemoglobin: 12.9 g/dL — ABNORMAL LOW (ref 13.0–17.0)
MCH: 30.9 pg (ref 26.0–34.0)
MCHC: 33.9 g/dL (ref 30.0–36.0)
MCV: 91.1 fL (ref 78.0–100.0)
Platelets: 208 10*3/uL (ref 150–400)
RBC: 4.18 MIL/uL — ABNORMAL LOW (ref 4.22–5.81)
RDW: 13.2 % (ref 11.5–15.5)
WBC: 8.1 10*3/uL (ref 4.0–10.5)

## 2017-03-20 LAB — BASIC METABOLIC PANEL WITH GFR
Anion gap: 8 (ref 5–15)
BUN: 20 mg/dL (ref 6–20)
CO2: 26 mmol/L (ref 22–32)
Calcium: 8.8 mg/dL — ABNORMAL LOW (ref 8.9–10.3)
Chloride: 107 mmol/L (ref 101–111)
Creatinine, Ser: 1.18 mg/dL (ref 0.61–1.24)
GFR calc Af Amer: >60 mL/min
GFR calc non Af Amer: >60 mL/min
Glucose, Bld: 111 mg/dL — ABNORMAL HIGH (ref 65–99)
Potassium: 3 mmol/L — ABNORMAL LOW (ref 3.5–5.1)
Sodium: 141 mmol/L (ref 135–145)

## 2017-03-20 LAB — GLUCOSE, CAPILLARY: Glucose-Capillary: 115 mg/dL — ABNORMAL HIGH (ref 65–99)

## 2017-03-20 LAB — HIV ANTIBODY (ROUTINE TESTING W REFLEX): HIV Screen 4th Generation wRfx: NONREACTIVE

## 2017-03-20 MED ORDER — KCL IN DEXTROSE-NACL 40-5-0.9 MEQ/L-%-% IV SOLN
INTRAVENOUS | Status: DC
  Administered 2017-03-20 – 2017-03-21 (×3): via INTRAVENOUS
  Filled 2017-03-20 (×3): qty 1000

## 2017-03-20 MED ORDER — IOPAMIDOL (ISOVUE-300) INJECTION 61%
100.0000 mL | Freq: Once | INTRAVENOUS | Status: AC | PRN
  Administered 2017-03-20: 100 mL via INTRAVENOUS

## 2017-03-20 MED ORDER — IOPAMIDOL (ISOVUE-300) INJECTION 61%
INTRAVENOUS | Status: AC
  Filled 2017-03-20: qty 100

## 2017-03-20 MED ORDER — POTASSIUM CHLORIDE 10 MEQ/50ML IV SOLN
10.0000 meq | INTRAVENOUS | Status: AC
  Administered 2017-03-20 (×2): 10 meq via INTRAVENOUS
  Filled 2017-03-20 (×2): qty 50

## 2017-03-20 NOTE — Progress Notes (Signed)
[facility]                                                                              Patient Demographics  Benjamin Valdez, is a 56 y.o. male, DOB - 1960-06-03, ZSW:[id number]  Admit date - 03/19/2017   Admitting Physician Ivor Costa, MD  Outpatient Primary MD for the patient is Baxley, Cresenciano Lick, MD  Outpatient specialists:   LOS - 0  days    Chief Complaint  Patient presents with  . Emesis       Brief summary  Benjamin Valdez is a 56 y.o. male with medical history significant of hypertension, hyperlipidemia, GERD, depression with anxiety, marijuana abuse, gastritis, angiosarcoma (s/p of left BKA), who presents with intractable nausea, vomiting abdominal pain. He had vomited more than 20 times.  The evaluation was negative for any significant etiology.  Admitted for symptom control of his GI symptoms      Assessment & Plan    Principal Problem:   Intractable nausea and vomiting Active Problems:   Hypercholesterolemia   GERD (gastroesophageal reflux disease)   Depression with anxiety   Abdominal pain    Intractable nausea and vomiting and abdominal pain;  etiology is not clear. Marijuana abuse is a potential etiology.  Lipase is minimally elevated at 57 and LFT normal, not seem to have pancreatitis, which is probably due to severe nausea and vomiting. Other differential diagnoses include viral gastritis and gastroparesis.  - placed on MedSurg bed for observation -Scheduled Reglan, when necessary Zofran -IV fluids: 2 L normal saline, followed by 125 mL per hour - CT abdomen/pelvis negative -check - positive for benzo and THC  Hypokalemia Replete potassium  HTN:  Patient is not taking medications at home. Blood pressure was 156/95 on admission - hydralazine prn for now Hypercholesterolemia: -crestor  GERD: -Pepcid IV  Depression and anxiety: Stable, no suicidal or homicidal ideations. -Continue home medications:  Amitriptyline, Paxil     Code Status: Full code DVT Prophylaxis:  Lovenox  Family Communication: Discussed in detail with the patient, all imaging results, lab results explained to the patient   Disposition Plan: Home  Time Spent in minutes  35 minutes  Procedures:    Consultants:     Antimicrobials:      Medications  Scheduled Meds: . enoxaparin (LOVENOX) injection  40 mg Subcutaneous Q24H  . iopamidol      . metoCLOPramide (REGLAN) injection  5 mg Intravenous Q8H  . rosuvastatin  20 mg Oral Daily   Continuous Infusions: . sodium chloride 125 mL/hr at 03/19/17 2015  . famotidine (PEPCID) IV 20 mg (03/20/17 0931)  . PARoxetine (PAXIL) 45 mg     PRN Meds:.acetaminophen **OR** acetaminophen, amitriptyline, diazepam, hydrALAZINE, ondansetron (ZOFRAN) IV, zolpidem   Antibiotics   Anti-infectives (From admission, onward)   None        Subjective:   Benjamin Valdez was seen and examined today.  Patient denies dizziness, chest pain, shortness of breath, abdominal pain.  Still nauseated, and episode of vomiting earlier today.  Objective:   Vitals:   03/19/17 2000 03/19/17 2023 03/19/17 2116 03/20/17 0529  BP: (!) 154/88  [name] Kitchen)  161/80 (!) 151/81  Pulse: 87  77 79  Resp:   18 18  Temp:  99.4 F (37.4 C) 98.7 F (37.1 C) 99.1 F (37.3 C)  TempSrc:  Oral Oral Oral  SpO2: 98% 96% 98% 98%  Weight:      Height:        Intake/Output Summary (Last 24 hours) at 03/20/2017 1126 Last data filed at 03/20/2017 1000 Gross per 24 hour  Intake 2758.33 ml  Output 978 ml  Net 1780.33 ml     Wt Readings from Last 3 Encounters:  03/19/17 81.6 kg (180 lb)  01/19/17 79.4 kg (175 lb)  11/16/16 78.5 kg (173 lb)     Exam  General: NAD  HEENT: NCAT,  PERRL,MMM  Neck: SUPPLE, (-) JVD  Cardiovascular: RRR, (-) GALLOP, (-) MURMUR  Respiratory: CTA  Gastrointestinal: SOFT, (-) DISTENSION, BS(+), (_) TENDERNESS  Ext: (-) CYANOSIS, (-) EDEMA  Neuro: A, OX  3  Skin:(-) RASH  Psych:NORMAL AFFECT/MOOD   Data Reviewed:  I have personally reviewed following labs and imaging studies  Micro Results No results found for this or any previous visit (from the past 240 hour(s)).  Radiology Reports Ct Abdomen Pelvis W Contrast  Result Date: 03/20/2017 CLINICAL DATA:  Nausea, vomiting, abdominal pain. EXAM: CT ABDOMEN AND PELVIS WITH CONTRAST TECHNIQUE: Multidetector CT imaging of the abdomen and pelvis was performed using the standard protocol following bolus administration of intravenous contrast. CONTRAST:  135mL ISOVUE-300 IOPAMIDOL (ISOVUE-300) INJECTION 61% COMPARISON:  03/15/2016 FINDINGS: Lower chest: Bibasilar atelectasis. No effusions. Heart is normal size. Hepatobiliary: Scattered small hypodensities in the liver, likely small cysts. Gallbladder is unremarkable. Pancreas: No focal abnormality or ductal dilatation. Spleen: No focal abnormality.  Normal size. Adrenals/Urinary Tract: No adrenal abnormality. No focal renal abnormality. No stones or hydronephrosis. Urinary bladder is unremarkable. Stomach/Bowel: Stomach, large and small bowel grossly unremarkable. No evidence of obstruction. Vascular/Lymphatic: Aortic and iliac calcifications. No evidence of aneurysm or adenopathy. Reproductive: Mildly prominent prostate. Other: No free fluid or free air. Musculoskeletal: No acute bony abnormality. Degenerative disc disease in the lower lumbar spine. IMPRESSION: No evidence of bowel obstruction. No acute findings in the abdomen or pelvis. Aortoiliac atherosclerosis. Bibasilar atelectasis. Electronically Signed   By: Rolm Baptise M.D.   On: 03/20/2017 09:15    Lab Data:  CBC: Recent Labs  Lab 03/19/17 1137 03/20/17 0448  WBC 11.6* 8.1  HGB 14.7 12.9*  HCT 42.4 38.1*  MCV 89.8 91.1  PLT 233 242   Basic Metabolic Panel: Recent Labs  Lab 03/19/17 1137 03/20/17 0448  NA 142 141  K 4.1 3.0*  CL 106 107  CO2 23 26  GLUCOSE 177* 111*  BUN 30*  20  CREATININE 1.11 1.18  CALCIUM 10.1 8.8*   GFR: Estimated Creatinine Clearance: 77.6 mL/min (by C-G formula based on SCr of 1.18 mg/dL). Liver Function Tests: Recent Labs  Lab 03/19/17 1137  AST 25  ALT 16*  ALKPHOS 77  BILITOT 0.6  PROT 7.6  ALBUMIN 4.8   Recent Labs  Lab 03/19/17 1137  LIPASE 57*   No results for input(s): AMMONIA in the last 168 hours. Coagulation Profile: No results for input(s): INR, PROTIME in the last 168 hours. Cardiac Enzymes: No results for input(s): CKTOTAL, CKMB, CKMBINDEX, TROPONINI in the last 168 hours. BNP (last 3 results) No results for input(s): PROBNP in the last 8760 hours. HbA1C: No results for input(s): HGBA1C in the last 72 hours. CBG: Recent Labs  Lab 03/20/17 [phone]  GLUCAP 115*   Lipid Profile: No results for input(s): CHOL, HDL, LDLCALC, TRIG, CHOLHDL, LDLDIRECT in the last 72 hours. Thyroid Function Tests: No results for input(s): TSH, T4TOTAL, FREET4, T3FREE, THYROIDAB in the last 72 hours. Anemia Panel: No results for input(s): VITAMINB12, FOLATE, FERRITIN, TIBC, IRON, RETICCTPCT in the last 72 hours. Urine analysis:    Component Value Date/Time   COLORURINE YELLOW 03/19/2017 1500   APPEARANCEUR CLEAR 03/19/2017 1500   LABSPEC 1.024 03/19/2017 1500   PHURINE 6.0 03/19/2017 1500   GLUCOSEU NEGATIVE 03/19/2017 1500   HGBUR NEGATIVE 03/19/2017 1500   BILIRUBINUR NEGATIVE 03/19/2017 1500   BILIRUBINUR NEG 11/16/2016 1412   KETONESUR 5 (A) 03/19/2017 1500   PROTEINUR NEGATIVE 03/19/2017 1500   UROBILINOGEN 0.2 11/16/2016 1412   UROBILINOGEN 0.2 06/29/2011 0918   NITRITE NEGATIVE 03/19/2017 1500   LEUKOCYTESUR NEGATIVE 03/19/2017 1500     OSEI-BONSU,Benjamin Valdez M.D. [facility] 03/20/2017, 11:26 AM  Pager: [phone] Between 7am to 7pm - call Pager - [phone]  After 7pm go to [email or web address] - password [email or web address]  Call night coverage person covering after 7pm

## 2017-03-20 NOTE — Progress Notes (Signed)
Clarification order received regarding cardiac monitoring order. MD instructed to discontinue order.

## 2017-03-21 LAB — BASIC METABOLIC PANEL
ANION GAP: 7 (ref 5–15)
BUN: 14 mg/dL (ref 6–20)
CALCIUM: 8.8 mg/dL — AB (ref 8.9–10.3)
CO2: 24 mmol/L (ref 22–32)
CREATININE: 0.98 mg/dL (ref 0.61–1.24)
Chloride: 108 mmol/L (ref 101–111)
GFR calc Af Amer: >60 mL/min (ref >60)
GLUCOSE: 108 mg/dL — AB (ref 65–99)
Potassium: 4 mmol/L (ref 3.5–5.1)
Sodium: 139 mmol/L (ref 135–145)

## 2017-03-21 LAB — GLUCOSE, CAPILLARY: Glucose-Capillary: 115 mg/dL — ABNORMAL HIGH (ref 65–99)

## 2017-03-21 MED ORDER — FAMOTIDINE 20 MG PO TABS: 20.0000 mg | ORAL_TABLET | Freq: Two times a day (BID) | ORAL | Status: DC

## 2017-03-21 MED ORDER — FAMOTIDINE 20 MG PO TABS: 20.0000 mg | ORAL_TABLET | Freq: Two times a day (BID) | ORAL | 0 refills | Status: DC

## 2017-03-21 NOTE — Discharge Summary (Signed)
Benjamin Valdez, is a 56 y.o. male  DOB December 21, 1960  MRN [id number].  Admission date:  03/19/2017  Admitting Physician  Ivor Costa, MD  Discharge Date:  03/21/2017   Primary MD  Elby Showers, MD  Recommendations for primary care physician for things to follow:  CBC and BMP Review hypertension Admission Diagnosis  Non-intractable vomiting with nausea, unspecified vomiting type [R11.2]   Discharge Diagnosis  Non-intractable vomiting with nausea, unspecified vomiting type [R11.2]    Principal Problem:   Intractable nausea and vomiting Active Problems:   Hypercholesterolemia   GERD (gastroesophageal reflux disease)   Depression with anxiety   Abdominal pain      Past Medical History:  Diagnosis Date  . Angiosarcoma ([place])    Of the left leg  . Bone cancer ([place])    has left BK amputation  . Chest pain April 2010   Myocardial Infarction ruled out  . Dyslipidemia   . Essential hypertension   . Gastritis April 2010   Acute--Resolved  . Hiatal hernia   . Marijuana abuse    History of Marijuana use  . Nausea alone    Recurrent  . Neck problem   . Vomiting     Past Surgical History:  Procedure Laterality Date  . AMPUTATION     Left leg below the knee   . APPENDECTOMY    . CERVICAL LAMINECTOMY         HPI  from the history and physical done on the day of admission:  [name] is a 56 y.o. male with medical history significant of hypertension, hyperlipidemia, GERD, depression with anxiety, marijuana abuse, gastritis, angiosarcoma (s/p of left BKA), who presents with intractable nausea, vomiting abdominal pain  Patient states that he has been having severe nausea and vomiting today, which has been progressively getting worse. He vomited more than 20 times without blood in the vomitus. No diarrhea. He also has abdominal pain, which is moderate, located in the central abdomen,  constant, nonradiating. No fever or chills. Patient states that he used marijuana 3 days ago. Patient does not have chest pain, SOB, cough, symptoms of UTI or unilateral weakness.   ED Course: pt was found to have WBC 11.6, lipase 57, negative urinalysis, creatinine 1.11, potassium normal, temperature normal, heart rate 90s, no tachypnea, oxygen saturation 100% on room air. Patient is placed on MedSurg bed for observation.    Hospital Course:  [name] a 55 y.o.malewith medical history significant ofhypertension, hyperlipidemia, GERD, depression with anxiety, marijuana abuse, gastritis, angiosarcoma (s/p ofleft BKA), who presents with intractable nausea, vomiting abdominal pain. He had vomited more than 20 times.  The evaluation was negative for any significant etiology.  Admitted for symptom control of his GI symptoms.   Intractable nausea and vomitingandabdominal pain; etiology is not clear- poss Gastritis/gastroparesis ?? Marijuana abuse. Lipase is minimallyelevated at 57and LFT normal, not seem to havepancreatitis, which is probably due to severe nausea and vomiting -placed on MedSurg bed for observation -Scheduled Reglan, when necessary Zofran -IV fluids:  2 L normal saline, followed by 125 mL per hour - CT abdomen/pelvis negative -UDS - positive for benzo and THC  Hypokalemia Repleted potassium  HTN: Patient is not taking medications at home. Blood pressure was 156/95 on admission - hydralazine prn for now, but did not receive ant for 2 days. Hypercholesterolemia: -crestor  GERD: -Pepcid /PPI  Depression and anxiety:Stable, no suicidal or homicidal ideations. -Continue home medications:Amitriptyline,Paxil         Discharge Condition: Stable and satisfactory stable and satisfactory  Follow UP  Follow-up Information    Baxley, Cresenciano Lick, MD. Schedule an appointment as soon as possible for a visit in 2 days.   Specialty:  Internal  Medicine Why:  Make an appoinment for follow up with your doctor Contact information: [address] [place] [id number] [place] DEPT.   Specialty:  Emergency Medicine Why:  Please return to the ER with new or worsening symptoms or new concerns. Contact information: [place] [id number] [place] [phone]           Consults obtained -not applicable Diet and Activity recommendation:  As advised  Discharge Instructions     Discharge Instructions    Call MD for:  difficulty breathing, headache or visual disturbances   Complete by:  As directed    Call MD for:  extreme fatigue   Complete by:  As directed    Call MD for:  hives   Complete by:  As directed    Call MD for:  persistant dizziness or light-headedness   Complete by:  As directed    Call MD for:  persistant nausea and vomiting   Complete by:  As directed    Call MD for:  redness, tenderness, or signs of infection (pain, swelling, redness, odor or green/yellow discharge around incision site)   Complete by:  As directed    Call MD for:  severe uncontrolled pain   Complete by:  As directed    Call MD for:  temperature >100.4   Complete by:  As directed    Diet - low sodium heart healthy   Complete by:  As directed    Increase activity slowly   Complete by:  As directed         Discharge Medications     Allergies as of 03/21/2017   No Known Allergies     Medication List    TAKE these medications   amitriptyline 50 MG tablet Commonly known as:  ELAVIL Take 50-100 mg by mouth at bedtime.   diazepam 5 MG tablet Commonly known as:  VALIUM TAKE 1 TABLET BY MOUTH EVERY 8 HOURS AS NEEDED FOR ANXIETY   famotidine 20 MG tablet Commonly known as:  PEPCID Take 1 tablet (20 mg total) by mouth 2 (two) times daily.   Melatonin 10 MG Tabs Take 1 tablet by mouth at bedtime.   omeprazole 40 MG  capsule Commonly known as:  PRILOSEC Take 1 capsule (40 mg total) by mouth daily.   ondansetron 4 MG tablet Commonly known as:  ZOFRAN Take 1 tablet (4 mg total) by mouth every 8 (eight) hours as needed for nausea or vomiting.   PARoxetine 30 MG tablet Commonly known as:  PAXIL Take 45 mg by mouth every morning.   promethazine 25 MG suppository Commonly known as:  PHENERGAN Place 1 suppository (25 mg total) rectally every 6 (six) hours as needed for nausea  or vomiting.   rosuvastatin 20 MG tablet Commonly known as:  CRESTOR TAKE ONE TABLET BY MOUTH ONE TIME DAILY.       Major procedures and Radiology Reports - PLEASE review detailed and final reports for all details, in brief -     Ct Abdomen Pelvis W Contrast  Result Date: 03/20/2017 CLINICAL DATA:  Nausea, vomiting, abdominal pain. EXAM: CT ABDOMEN AND PELVIS WITH CONTRAST TECHNIQUE: Multidetector CT imaging of the abdomen and pelvis was performed using the standard protocol following bolus administration of intravenous contrast. CONTRAST:  135mL ISOVUE-300 IOPAMIDOL (ISOVUE-300) INJECTION 61% COMPARISON:  03/15/2016 FINDINGS: Lower chest: Bibasilar atelectasis. No effusions. Heart is normal size. Hepatobiliary: Scattered small hypodensities in the liver, likely small cysts. Gallbladder is unremarkable. Pancreas: No focal abnormality or ductal dilatation. Spleen: No focal abnormality.  Normal size. Adrenals/Urinary Tract: No adrenal abnormality. No focal renal abnormality. No stones or hydronephrosis. Urinary bladder is unremarkable. Stomach/Bowel: Stomach, large and small bowel grossly unremarkable. No evidence of obstruction. Vascular/Lymphatic: Aortic and iliac calcifications. No evidence of aneurysm or adenopathy. Reproductive: Mildly prominent prostate. Other: No free fluid or free air. Musculoskeletal: No acute bony abnormality. Degenerative disc disease in the lower lumbar spine. IMPRESSION: No evidence of bowel obstruction. No  acute findings in the abdomen or pelvis. Aortoiliac atherosclerosis. Bibasilar atelectasis. Electronically Signed   By: Rolm Baptise M.D.   On: 03/20/2017 09:15    Micro Results     No results found for this or any previous visit (from the past 240 hour(s)).     Today   Subjective    Hardeep Reetz today has no nausea or vomiting.  No fever or chills.  No dizziness.  She is feeling very well and back to baseline.  She has not received any intensive medications since 03/19/2017.   Patient has been seen and examined prior to discharge   Objective   Blood pressure 121/85, pulse 64, temperature 98.3 F (36.8 C), temperature source Oral, resp. rate 16, height 6' (1.829 m), weight 81.6 kg (180 lb), SpO2 96 %.   Intake/Output Summary (Last 24 hours) at 03/21/2017 1206 Last data filed at 03/21/2017 1154 Gross per 24 hour  Intake 3355 ml  Output 2675 ml  Net 680 ml     ExamGeneral: NAD  HEENT: NCAT,  PERRL,MMM  Neck: SUPPLE, (-) JVD  Cardiovascular: RRR, (-) GALLOP, (-) MURMUR  Respiratory: CTA  Gastrointestinal: SOFT, (-) DISTENSION, BS(+), (_) TENDERNESS  Ext: (-) CYANOSIS, (-) EDEMA  Neuro: A, OX 3  Skin:(-) RASH  Psych:NORMAL AFFECT/MOOD        Data Review   CBC w Diff:  Lab Results  Component Value Date   WBC 8.1 03/20/2017   HGB 12.9 (L) 03/20/2017   HCT 38.1 (L) 03/20/2017   PLT 208 03/20/2017   LYMPHOPCT 16 11/13/2016   MONOPCT 12 11/13/2016   EOSPCT 4 11/13/2016   BASOPCT 1 11/13/2016    CMP:  Lab Results  Component Value Date   NA 139 03/21/2017   K 4.0 03/21/2017   CL 108 03/21/2017   CO2 24 03/21/2017   BUN 14 03/21/2017   CREATININE 0.98 03/21/2017   CREATININE 1.00 11/13/2016   PROT 7.6 03/19/2017   ALBUMIN 4.8 03/19/2017   BILITOT 0.6 03/19/2017   ALKPHOS 77 03/19/2017   AST 25 03/19/2017   ALT 16 (L) 03/19/2017  .   Total Discharge time is about 33 minutes  OSEI-BONSU,Aamna Mallozzi M.D on 03/21/2017 at 12:06 PM  [facility]  Office  [phone]  Dragon dictation system was used to create this note, attempts have been made to correct errors, however presence of uncorrected errors is not a reflection quality of care provided

## 2017-03-21 NOTE — Progress Notes (Signed)
Assessment unchanged. Pt verbalized understanding of dc instructions through teach back including follow up care and when to call the doctor. Scripts given as provided by MD. Discharged via wc to front entrance to meet awaiting vehicle to carry home. Accompanied by NT.

## 2017-03-22 ENCOUNTER — Encounter: Payer: Self-pay | Admitting: Internal Medicine

## 2017-03-27 ENCOUNTER — Other Ambulatory Visit: Payer: Self-pay

## 2017-03-27 ENCOUNTER — Inpatient Hospital Stay (HOSPITAL_COMMUNITY)
Admission: EM | Admit: 2017-03-27 | Discharge: 2017-03-29 | DRG: 103 | Disposition: A | Discharge status: Home / Self Care | Payer: Medicare Other | Attending: Nephrology | Admitting: Nephrology

## 2017-03-27 ENCOUNTER — Encounter (HOSPITAL_COMMUNITY): Payer: Self-pay | Admitting: Emergency Medicine

## 2017-03-27 DIAGNOSIS — R1115 Cyclical vomiting syndrome unrelated to migraine: Secondary | ICD-10-CM

## 2017-03-27 DIAGNOSIS — Z85830 Personal history of malignant neoplasm of bone: Secondary | ICD-10-CM

## 2017-03-27 DIAGNOSIS — Z89512 Acquired absence of left leg below knee: Secondary | ICD-10-CM

## 2017-03-27 DIAGNOSIS — E876 Hypokalemia: Secondary | ICD-10-CM | POA: Diagnosis present

## 2017-03-27 DIAGNOSIS — I10 Essential (primary) hypertension: Secondary | ICD-10-CM | POA: Diagnosis present

## 2017-03-27 DIAGNOSIS — F1210 Cannabis abuse, uncomplicated: Secondary | ICD-10-CM | POA: Diagnosis present

## 2017-03-27 DIAGNOSIS — R112 Nausea with vomiting, unspecified: Secondary | ICD-10-CM | POA: Diagnosis not present

## 2017-03-27 DIAGNOSIS — K529 Noninfective gastroenteritis and colitis, unspecified: Secondary | ICD-10-CM | POA: Diagnosis present

## 2017-03-27 DIAGNOSIS — F411 Generalized anxiety disorder: Secondary | ICD-10-CM | POA: Diagnosis present

## 2017-03-27 DIAGNOSIS — G43A1 Cyclical vomiting, intractable: Secondary | ICD-10-CM | POA: Diagnosis present

## 2017-03-27 DIAGNOSIS — E7800 Pure hypercholesterolemia, unspecified: Secondary | ICD-10-CM | POA: Diagnosis present

## 2017-03-27 DIAGNOSIS — Z79899 Other long term (current) drug therapy: Secondary | ICD-10-CM

## 2017-03-27 DIAGNOSIS — Z85831 Personal history of malignant neoplasm of soft tissue: Secondary | ICD-10-CM | POA: Diagnosis not present

## 2017-03-27 DIAGNOSIS — K449 Diaphragmatic hernia without obstruction or gangrene: Secondary | ICD-10-CM | POA: Diagnosis present

## 2017-03-27 DIAGNOSIS — K219 Gastro-esophageal reflux disease without esophagitis: Secondary | ICD-10-CM | POA: Diagnosis present

## 2017-03-27 LAB — CREATININE, SERUM
Creatinine, Ser: 1.1 mg/dL (ref 0.61–1.24)
GFR calc non Af Amer: >60 mL/min (ref >60)

## 2017-03-27 LAB — URINALYSIS, ROUTINE W REFLEX MICROSCOPIC
Bilirubin Urine: NEGATIVE
GLUCOSE, UA: NEGATIVE mg/dL
Hgb urine dipstick: NEGATIVE
KETONES UR: NEGATIVE mg/dL
LEUKOCYTES UA: NEGATIVE
Nitrite: NEGATIVE
PROTEIN: NEGATIVE mg/dL
Specific Gravity, Urine: 1.016 (ref 1.005–1.030)
pH: 8 (ref 5.0–8.0)

## 2017-03-27 LAB — RAPID URINE DRUG SCREEN, HOSP PERFORMED
AMPHETAMINES: NOT DETECTED
BENZODIAZEPINES: POSITIVE — AB
Barbiturates: NOT DETECTED
Cocaine: NOT DETECTED
OPIATES: POSITIVE — AB
Tetrahydrocannabinol: POSITIVE — AB

## 2017-03-27 LAB — COMPREHENSIVE METABOLIC PANEL
ALK PHOS: 75 U/L (ref 38–126)
ALT: 17 U/L (ref 17–63)
AST: 29 U/L (ref 15–41)
Albumin: 4.6 g/dL (ref 3.5–5.0)
Anion gap: 12 (ref 5–15)
BUN: 25 mg/dL — AB (ref 6–20)
CALCIUM: 9.5 mg/dL (ref 8.9–10.3)
CHLORIDE: 104 mmol/L (ref 101–111)
CO2: 22 mmol/L (ref 22–32)
CREATININE: 1.06 mg/dL (ref 0.61–1.24)
GFR calc Af Amer: >60 mL/min (ref >60)
Glucose, Bld: 155 mg/dL — ABNORMAL HIGH (ref 65–99)
Potassium: 3.7 mmol/L (ref 3.5–5.1)
Sodium: 138 mmol/L (ref 135–145)
Total Bilirubin: 0.7 mg/dL (ref 0.3–1.2)
Total Protein: 7.8 g/dL (ref 6.5–8.1)

## 2017-03-27 LAB — CBC
HCT: 41.5 % (ref 39.0–52.0)
HCT: 39.1 % (ref 39.0–52.0)
Hemoglobin: 14.7 g/dL (ref 13.0–17.0)
Hemoglobin: 13.6 g/dL (ref 13.0–17.0)
MCH: 30.9 pg (ref 26.0–34.0)
MCH: 30.8 pg (ref 26.0–34.0)
MCHC: 35.4 g/dL (ref 30.0–36.0)
MCHC: 34.8 g/dL (ref 30.0–36.0)
MCV: 87.4 fL (ref 78.0–100.0)
MCV: 88.5 fL (ref 78.0–100.0)
PLATELETS: 256 10*3/uL (ref 150–400)
Platelets: 228 10*3/uL (ref 150–400)
RBC: 4.75 MIL/uL (ref 4.22–5.81)
RBC: 4.42 MIL/uL (ref 4.22–5.81)
RDW: 12.8 % (ref 11.5–15.5)
RDW: 12.9 % (ref 11.5–15.5)
WBC: 9.6 10*3/uL (ref 4.0–10.5)
WBC: 5.9 10*3/uL (ref 4.0–10.5)

## 2017-03-27 LAB — LIPASE, BLOOD: Lipase: 29 U/L (ref 11–51)

## 2017-03-27 MED ORDER — ACETAMINOPHEN 325 MG PO TABS
650.0000 mg | ORAL_TABLET | Freq: Four times a day (QID) | ORAL | Status: DC | PRN
Start: 2017-03-27 — End: 2017-03-29

## 2017-03-27 MED ORDER — METOCLOPRAMIDE HCL 5 MG/ML IJ SOLN
10.0000 mg | Freq: Once | INTRAMUSCULAR | Status: AC
  Administered 2017-03-27: 10 mg via INTRAVENOUS
  Filled 2017-03-27: qty 2

## 2017-03-27 MED ORDER — GI COCKTAIL ~~LOC~~
30.0000 mL | Freq: Once | ORAL | Status: AC
  Administered 2017-03-27: 30 mL via ORAL
  Filled 2017-03-27: qty 30

## 2017-03-27 MED ORDER — PAROXETINE HCL 10 MG PO TABS
45.0000 mg | ORAL_TABLET | Freq: Every day | ORAL | Status: DC
  Administered 2017-03-28 – 2017-03-29 (×2): 45 mg via ORAL
  Filled 2017-03-27 (×3): qty 4.5

## 2017-03-27 MED ORDER — ROSUVASTATIN CALCIUM 20 MG PO TABS
20.0000 mg | ORAL_TABLET | Freq: Every day | ORAL | Status: DC
  Administered 2017-03-28: 20 mg via ORAL
  Filled 2017-03-27: qty 1

## 2017-03-27 MED ORDER — AMITRIPTYLINE HCL 50 MG PO TABS
50.0000 mg | ORAL_TABLET | Freq: Every day | ORAL | Status: DC
  Administered 2017-03-27 – 2017-03-28 (×2): 100 mg via ORAL
  Filled 2017-03-27 (×2): qty 2

## 2017-03-27 MED ORDER — ONDANSETRON HCL 4 MG/2ML IJ SOLN: 4.0000 mg | Freq: Four times a day (QID) | INTRAMUSCULAR | Status: DC | PRN

## 2017-03-27 MED ORDER — ACETAMINOPHEN 650 MG RE SUPP: 650.0000 mg | Freq: Four times a day (QID) | RECTAL | Status: DC | PRN

## 2017-03-27 MED ORDER — SODIUM CHLORIDE 0.9 % IV SOLN
INTRAVENOUS | Status: DC
  Administered 2017-03-27 – 2017-03-29 (×3): via INTRAVENOUS

## 2017-03-27 MED ORDER — HALOPERIDOL LACTATE 5 MG/ML IJ SOLN
2.0000 mg | Freq: Once | INTRAMUSCULAR | Status: AC
  Administered 2017-03-27: 2 mg via INTRAVENOUS
  Filled 2017-03-27: qty 1

## 2017-03-27 MED ORDER — DIAZEPAM 5 MG PO TABS
5.0000 mg | ORAL_TABLET | Freq: Three times a day (TID) | ORAL | Status: DC | PRN
  Administered 2017-03-28: 5 mg via ORAL
  Filled 2017-03-27: qty 1

## 2017-03-27 MED ORDER — ONDANSETRON HCL 4 MG PO TABS: 4.0000 mg | ORAL_TABLET | Freq: Four times a day (QID) | ORAL | Status: DC | PRN

## 2017-03-27 MED ORDER — DIPHENHYDRAMINE HCL 50 MG/ML IJ SOLN
25.0000 mg | Freq: Once | INTRAMUSCULAR | Status: AC
  Administered 2017-03-27: 25 mg via INTRAVENOUS
  Filled 2017-03-27: qty 1

## 2017-03-27 MED ORDER — PANTOPRAZOLE SODIUM 40 MG PO TBEC
40.0000 mg | DELAYED_RELEASE_TABLET | Freq: Every day | ORAL | Status: DC
  Administered 2017-03-28 – 2017-03-29 (×2): 40 mg via ORAL
  Filled 2017-03-27 (×2): qty 1

## 2017-03-27 MED ORDER — MORPHINE SULFATE (PF) 4 MG/ML IV SOLN
4.0000 mg | Freq: Once | INTRAVENOUS | Status: AC
  Administered 2017-03-27: 4 mg via INTRAVENOUS
  Filled 2017-03-27: qty 1

## 2017-03-27 MED ORDER — ONDANSETRON HCL 4 MG/2ML IJ SOLN
4.0000 mg | Freq: Once | INTRAMUSCULAR | Status: AC
  Administered 2017-03-27: 4 mg via INTRAVENOUS
  Filled 2017-03-27: qty 2

## 2017-03-27 MED ORDER — METOCLOPRAMIDE HCL 5 MG/ML IJ SOLN: 5.0000 mg | Freq: Three times a day (TID) | INTRAMUSCULAR | Status: DC | PRN

## 2017-03-27 MED ORDER — ENOXAPARIN SODIUM 40 MG/0.4ML ~~LOC~~ SOLN
40.0000 mg | SUBCUTANEOUS | Status: DC
  Administered 2017-03-27 – 2017-03-28 (×2): 40 mg via SUBCUTANEOUS
  Filled 2017-03-27 (×2): qty 0.4

## 2017-03-27 MED ORDER — SODIUM CHLORIDE 0.9 % IV BOLUS (SEPSIS)
1000.0000 mL | Freq: Once | INTRAVENOUS | Status: AC
  Administered 2017-03-27: 1000 mL via INTRAVENOUS

## 2017-03-27 MED ORDER — KETOROLAC TROMETHAMINE 30 MG/ML IJ SOLN: 30.0000 mg | Freq: Four times a day (QID) | INTRAMUSCULAR | Status: DC | PRN

## 2017-03-27 NOTE — H&P (Signed)
History and Physical    Benjamin Valdez YQM:[id number] DOB: 11-Feb-1961 DOA: 03/27/2017  PCP: Elby Showers, MD  Patient coming from: Home  Chief Complaint: Nausea/vomiting  HPI: Benjamin Valdez is a 56 y.o. male with medical history significant of chronic marjuiana abuse, angiosarcome of L leg s/p amputation, HLD, HTN who presents to ED with intractable n/v. Pt was recently discharged for same less than one week prior to admit. Patient's UDS was notable for marijuana. Patient improved on that admission with supportive care and discharged home.   On further questioning, patient reports having loose stools "a couple days" after recent discharge. Patient ate two hardboiled eggs two days prior to admission. After eating eggs, patient reports increased nausea with watery diarrhea. Denies fevers or chills. Diarrhea awoke patient from sleep on day of admission  ED Course: In the ED, patient was given multiple antiemetics however still failed PO trial. Patient given IVF hydration. Hospitalist consulted for consideration for admission.  Review of Systems:  Review of Systems  Constitutional: Negative for chills, fever, malaise/fatigue and weight loss.  HENT: Negative for congestion, ear discharge, ear pain and tinnitus.   Eyes: Negative for double vision, photophobia, pain and redness.  Respiratory: Negative for hemoptysis, sputum production and shortness of breath.   Cardiovascular: Negative for palpitations, orthopnea and claudication.  Gastrointestinal: Positive for nausea and vomiting. Negative for blood in stool and melena.  Genitourinary: Negative for frequency, hematuria and urgency.  Musculoskeletal: Negative for joint pain and neck pain.  Neurological: Negative for tingling, tremors, focal weakness, seizures, loss of consciousness and weakness.  Psychiatric/Behavioral: Negative for hallucinations, memory loss and substance abuse.    Past Medical History:  Diagnosis Date  .  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 Bone cancer ([place])    has left BK amputation  . Chest pain April 2010   Myocardial Infarction ruled out  . Dyslipidemia   . Essential hypertension   . Gastritis April 2010   Acute--Resolved  . Hiatal hernia   . Marijuana abuse    History of Marijuana use  . Nausea alone    Recurrent  . Neck problem   . Vomiting     Past Surgical History:  Procedure Laterality Date  . AMPUTATION     Left leg below the knee   . APPENDECTOMY    . CERVICAL LAMINECTOMY       reports that  has never smoked. he has never used smokeless tobacco. He reports that he uses drugs. Drug: Marijuana. He reports that he does not drink alcohol.  No Known Allergies  Family History  Problem Relation Age of Onset  . Diabetes Mother   . Bladder Cancer Father   . Crohn's disease Brother     Prior to Admission medications   Medication Sig Start Date End Date Taking? Authorizing Provider  amitriptyline (ELAVIL) 50 MG tablet Take 50-100 mg by mouth at bedtime.    Yes [provider]  diazepam (VALIUM) 5 MG tablet TAKE 1 TABLET BY MOUTH EVERY 8 HOURS AS NEEDED FOR ANXIETY 03/15/17  Yes Baxley, Cresenciano Lick, MD  famotidine (PEPCID) 20 MG tablet Take 1 tablet (20 mg total) by mouth 2 (two) times daily. 03/21/17  Yes Osei-Bonsu, Iona Beard, MD  Melatonin 10 MG TABS Take 10 mg by mouth at bedtime.    Yes [provider]  omeprazole (PRILOSEC) 40 MG capsule Take 1 capsule (40 mg total) by mouth daily. 11/16/16  Yes Baxley, Cresenciano Lick, MD  PARoxetine (PAXIL) 30 MG tablet Take 45 mg by mouth every morning.   Yes [provider]  promethazine (PHENERGAN) 25 MG suppository Place 1 suppository (25 mg total) rectally every 6 (six) hours as needed for nausea or vomiting. 03/19/17  Yes Ward, Ozella Almond, PA-C  rosuvastatin (CRESTOR) 20 MG tablet TAKE ONE TABLET BY MOUTH ONE TIME DAILY. 02/22/17  Yes Baxley, Cresenciano Lick, MD  ondansetron (ZOFRAN) 4 MG tablet Take 1 tablet (4 mg  total) by mouth every 8 (eight) hours as needed for nausea or vomiting. Patient not taking: Reported on 01/19/2017 11/16/16   Elby Showers, MD    Physical Exam: Vitals:   03/27/17 0814 03/27/17 0942  BP: (!) 160/97 (!) 159/95  Pulse: (!) 121 85  Resp: (!) 22 15  Temp: 99.7 F (37.6 C) 98.9 F (37.2 C)  TempSrc:  Oral  SpO2: 100% 97%    Constitutional: NAD, calm, comfortable Vitals:   03/27/17 0814 03/27/17 0942  BP: (!) 160/97 (!) 159/95  Pulse: (!) 121 85  Resp: (!) 22 15  Temp: 99.7 F (37.6 C) 98.9 F (37.2 C)  TempSrc:  Oral  SpO2: 100% 97%   Eyes: PERRL, lids and conjunctivae normal ENMT: Mucous membranes are moist. Posterior pharynx clear of any exudate or lesions.Normal dentition.  Neck: normal, supple, no masses, no thyromegaly Respiratory: clear to auscultation bilaterally, no wheezing, no crackles. Normal respiratory effort. No accessory muscle use.  Cardiovascular: Regular rate and rhythm, no murmurs / rubs / gallops. No extremity edema. 2+ pedal pulses. No carotid bruits.  Abdomen: no tenderness, no masses palpated. No hepatosplenomegaly. Bowel sounds positive.  Musculoskeletal: no clubbing / cyanosis. No joint deformity upper and lower extremities. Good ROM, no contractures. Normal muscle tone.  Skin: no rashes, lesions, ulcers. No induration Neurologic: CN 2-12 grossly intact. Sensation intact, DTR normal. Strength 5/5 in all 4.  Psychiatric: Normal judgment and insight. Alert and oriented x 3. Normal mood.    Labs on Admission: I have personally reviewed following labs and imaging studies  CBC: Recent Labs  Lab 03/27/17 0835  WBC 9.6  HGB 14.7  HCT 41.5  MCV 87.4  PLT 102   Basic Metabolic Panel: Recent Labs  Lab 03/21/17 0536 03/27/17 0835  NA 139 138  K 4.0 3.7  CL 108 104  CO2 24 22  GLUCOSE 108* 155*  BUN 14 25*  CREATININE 0.98 1.06  CALCIUM 8.8* 9.5   GFR: Estimated Creatinine Clearance: 86.4 mL/min (by C-G formula based on SCr  of 1.06 mg/dL). Liver Function Tests: Recent Labs  Lab 03/27/17 0835  AST 29  ALT 17  ALKPHOS 75  BILITOT 0.7  PROT 7.8  ALBUMIN 4.6   Recent Labs  Lab 03/27/17 0835  LIPASE 29   No results for input(s): AMMONIA in the last 168 hours. Coagulation Profile: No results for input(s): INR, PROTIME in the last 168 hours. Cardiac Enzymes: No results for input(s): CKTOTAL, CKMB, CKMBINDEX, TROPONINI in the last 168 hours. BNP (last 3 results) No results for input(s): PROBNP in the last 8760 hours. HbA1C: No results for input(s): HGBA1C in the last 72 hours. CBG: Recent Labs  Lab 03/21/17 0806  GLUCAP 115*   Lipid Profile: No results for input(s): CHOL, HDL, LDLCALC, TRIG, CHOLHDL, LDLDIRECT in the last 72 hours. Thyroid Function Tests: No results for input(s): TSH, T4TOTAL, FREET4, T3FREE, THYROIDAB in the last 72 hours. Anemia Panel: No results for input(s): VITAMINB12, FOLATE, FERRITIN, TIBC, IRON, RETICCTPCT in the last  72 hours. Urine analysis:    Component Value Date/Time   COLORURINE YELLOW 03/27/2017 1000   APPEARANCEUR CLEAR 03/27/2017 1000   LABSPEC 1.016 03/27/2017 1000   PHURINE 8.0 03/27/2017 1000   GLUCOSEU NEGATIVE 03/27/2017 1000   HGBUR NEGATIVE 03/27/2017 1000   BILIRUBINUR NEGATIVE 03/27/2017 1000   BILIRUBINUR NEG 11/16/2016 1412   KETONESUR NEGATIVE 03/27/2017 1000   PROTEINUR NEGATIVE 03/27/2017 1000   UROBILINOGEN 0.2 11/16/2016 1412   UROBILINOGEN 0.2 06/29/2011 0918   NITRITE NEGATIVE 03/27/2017 1000   LEUKOCYTESUR NEGATIVE 03/27/2017 1000   Sepsis Labs: !!!!!!!!!!!!!!!!!!!!!!!!!!!!!!!!!!!!!!!!!!!! @LABRCNTIP (procalcitonin:4,lacticidven:4) )No results found for this or any previous visit (from the past 240 hour(s)).   Radiological Exams on Admission: No results found.  EKG: Independently reviewed. Sinus, QTc 458  Assessment/Plan Principal Problem:   Intractable nausea and vomiting Active Problems:   Hypercholesterolemia   Hx of  BKA, left (HCC)   GAD (generalized anxiety disorder)   GERD (gastroesophageal reflux disease)   Marijuana abuse   1. Intractable nausea/vomiting 1. Recently discharged for same 2. In ED, unable to tolerate PO trial despite IV haldol, benadryl, reglan, and morphine 3. CT abd from 12/1 reviewed. Unremakable findings 4. Patient with hx of chronic marijuana abuse, however did not smoke since recent discharge 5. Given diarrhea, also concern for infectious gastroenteritis 6. Continue NPO with basal IVF 7. Continue antiemetics as needed 8. Eventually advance diet as tolerated 9. Will check stool study 2. Marijuana abuse 1. Cessation done at bedside 3. HLD 1. Stable at present 4. GERD 1. Will continue on PPI as tolerated  DVT prophylaxis: Lovenox subQ  Code Status: Full Family Communication: Pt in room,  Disposition Plan: Uncertain at this time  Consults called:  Admission status: Inpatient as would require IVF while NPO for bowel rest   Marylu Lund MD [facility] Pager [phone]  If 7PM-7AM, please contact night-coverage [email or web address] Password [email or web address]  03/27/2017, 11:03 AM

## 2017-03-27 NOTE — ED Notes (Signed)
Gave pt urinal for specimen.

## 2017-03-27 NOTE — ED Triage Notes (Signed)
Patient here with complaints of abdominal pain, nausea, vomiting. Reports hx of same, states that sometimes he has to be admitted. Started this morning. Vomiting in triage.

## 2017-03-27 NOTE — ED Provider Notes (Signed)
[facility] DEPT Provider Note   CSN: [id number] Arrival date & time: 03/27/17  0807     History   Chief Complaint Chief Complaint  Patient presents with  . Abdominal Pain  . Nausea  . Emesis    HPI Benjamin Valdez is a 56 y.o. male.  HPI 56 year old male with past medical history as below including cyclical vomiting who presents with abdominal pain, nausea, and vomiting.  The patient was just admitted and discharged 5 days ago for similar symptoms.  He states that he felt mildly well throughout the week.  He had persistent mild nausea but this was controllable.  He also notes that he had some loose stools, this is atypical for him.  He does have a history of marijuana use but denies any use this week.  He states that the symptoms are returned this morning.  He reports nausea, vomiting, and diffuse abdominal pain.  Initially, he did not have pain but since he has been vomiting, he is developed abdominal pain.  Denies any fevers but has had some chills.  No blood in his vomit or his stools.  Symptoms are similar to his previous episodes.  No chest pain or shortness of breath.  He is making normal urine.  Past Medical History:  Diagnosis Date  . 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 Bone cancer ([place])    has left BK amputation  . Chest pain April 2010   Myocardial Infarction ruled out  . Dyslipidemia   . Essential hypertension   . Gastritis April 2010   Acute--Resolved  . Hiatal hernia   . Marijuana abuse    History of Marijuana use  . Nausea alone    Recurrent  . Neck problem   . Vomiting     Patient Active Problem List   Diagnosis Date Noted  . GERD (gastroesophageal reflux disease) 03/19/2017  . Depression with anxiety 03/19/2017  . Intractable nausea and vomiting 03/19/2017  . Abdominal pain 03/19/2017  . GAD (generalized anxiety disorder) 02/13/2017  . Hx of BKA, left ([place]) 11/16/2016  . Hypercholesterolemia 01/28/2011  .  Bone sarcoma ([place]) 01/28/2011    Past Surgical History:  Procedure Laterality Date  . AMPUTATION     Left leg below the knee   . APPENDECTOMY    . CERVICAL LAMINECTOMY         Home Medications    Prior to Admission medications   Medication Sig Start Date End Date Taking? Authorizing Provider  amitriptyline (ELAVIL) 50 MG tablet Take 50-100 mg by mouth at bedtime.    Yes [provider]  diazepam (VALIUM) 5 MG tablet TAKE 1 TABLET BY MOUTH EVERY 8 HOURS AS NEEDED FOR ANXIETY 03/15/17  Yes Baxley, Cresenciano Lick, MD  famotidine (PEPCID) 20 MG tablet Take 1 tablet (20 mg total) by mouth 2 (two) times daily. 03/21/17  Yes Osei-Bonsu, Iona Beard, MD  Melatonin 10 MG TABS Take 10 mg by mouth at bedtime.    Yes [provider]  omeprazole (PRILOSEC) 40 MG capsule Take 1 capsule (40 mg total) by mouth daily. 11/16/16  Yes Baxley, Cresenciano Lick, MD  PARoxetine (PAXIL) 30 MG tablet Take 45 mg by mouth every morning.   Yes [provider]  promethazine (PHENERGAN) 25 MG suppository Place 1 suppository (25 mg total) rectally every 6 (six) hours as needed for nausea or vomiting. 03/19/17  Yes Ward, Ozella Almond, PA-C  rosuvastatin (CRESTOR) 20 MG tablet TAKE ONE TABLET  BY MOUTH ONE TIME DAILY. 02/22/17  Yes Baxley, Cresenciano Lick, MD  ondansetron (ZOFRAN) 4 MG tablet Take 1 tablet (4 mg total) by mouth every 8 (eight) hours as needed for nausea or vomiting. Patient not taking: Reported on 01/19/2017 11/16/16   Elby Showers, MD    Family History Family History  Problem Relation Age of Onset  . Diabetes Mother   . Bladder Cancer Father   . Crohn's disease Brother     Social History Social History   Tobacco Use  . Smoking status: Never Smoker  . Smokeless tobacco: Never Used  Substance Use Topics  . Alcohol use: No  . Drug use: Yes    Types: Marijuana    Comment: 3 times a week     Allergies   Patient has no known allergies.   Review of Systems Review of Systems    Constitutional: Positive for chills and fatigue.  Gastrointestinal: Positive for abdominal pain, nausea and vomiting.  All other systems reviewed and are negative.    Physical Exam Updated Vital Signs BP (!) 159/95 (BP Location: Right Arm)   Pulse 85   Temp 98.9 F (37.2 C) (Oral)   Resp 15   SpO2 97%   Physical Exam  Constitutional: He is oriented to person, place, and time. He appears well-developed and well-nourished. No distress.  HENT:  Head: Normocephalic and atraumatic.  Mildly dry mucous membranes  Eyes: Conjunctivae are normal.  Neck: Neck supple.  Cardiovascular: Normal rate, regular rhythm and normal heart sounds. Exam reveals no friction rub.  No murmur heard. Pulmonary/Chest: Effort normal and breath sounds normal. No respiratory distress. He has no wheezes. He has no rales.  Abdominal: Soft. Normal appearance. He exhibits no distension. Bowel sounds are increased. There is generalized tenderness (Generalized abdominal tenderness, worse with flexion of the abdominal wall). There is no rigidity, no rebound and no guarding.  Musculoskeletal: He exhibits no edema.  Neurological: He is alert and oriented to person, place, and time. He exhibits normal muscle tone.  Skin: Skin is warm. Capillary refill takes less than 2 seconds.  Psychiatric: He has a normal mood and affect.  Nursing note and vitals reviewed.    ED Treatments / Results  Labs (all labs ordered are listed, but only abnormal results are displayed) Labs Reviewed  COMPREHENSIVE METABOLIC PANEL - Abnormal; Notable for the following components:      Result Value   Glucose, Bld 155 (*)    BUN 25 (*)    All other components within normal limits  LIPASE, BLOOD  CBC  URINALYSIS, ROUTINE W REFLEX MICROSCOPIC    EKG  EKG Interpretation  Date/Time:  Saturday March 27 2017 09:13:22 EST Ventricular Rate:  72 PR Interval:    QRS Duration: 101 QT Interval:  418 QTC Calculation: 458 R  Axis:   85 Text Interpretation:  Sinus rhythm No significant change since last tracing Confirmed by Duffy Bruce [phone]) on 03/27/2017 9:26:10 AM       Radiology No results found.  Procedures Procedures (including critical care time)  Medications Ordered in ED Medications  metoCLOPramide (REGLAN) injection 10 mg (10 mg Intravenous Given 03/27/17 0909)  diphenhydrAMINE (BENADRYL) injection 25 mg (25 mg Intravenous Given 03/27/17 0908)  sodium chloride 0.9 % bolus 1,000 mL (0 mLs Intravenous Stopped 03/27/17 0942)  morphine 4 MG/ML injection 4 mg (4 mg Intravenous Given 03/27/17 0909)  sodium chloride 0.9 % bolus 1,000 mL (0 mLs Intravenous Stopped 03/27/17 1041)  haloperidol lactate (HALDOL)  injection 2 mg (2 mg Intravenous Given 03/27/17 0942)  gi cocktail (Maalox,Lidocaine,Donnatal) (30 mLs Oral Given 03/27/17 0942)  ondansetron (ZOFRAN) injection 4 mg (4 mg Intravenous Given 03/27/17 1003)     Initial Impression / Assessment and Plan / ED Course  I have reviewed the triage vital signs and the nursing notes.  Pertinent labs & imaging results that were available during my care of the patient were reviewed by me and considered in my medical decision making (see chart for details).     56 year old male with past medical history as above including cyclical vomiting here with ongoing nausea, vomiting, and subsequent dehydration.  Lab work is consistent with mild dehydration with elevated BUN but is otherwise reassuring.  However, despite multiple doses of antiemetics and fluids in the ED, patient has persistent vomiting and inability to tolerate p.o.  He has vomited several times on my assessment.  Discussed management options and will admit for ongoing fluids and nausea control.  Of note, patient does smoke marijuana so there could be a component of cannabis induced hyperemesis.  I trialed Haldol without significant relief.  His abdomen is otherwise soft, reassuring, and I do not feel imaging  is indicated.  Final Clinical Impressions(s) / ED Diagnoses   Final diagnoses:  Intractable cyclical vomiting with nausea    ED Discharge Orders    None       Duffy Bruce, MD 03/27/17 1044

## 2017-03-28 ENCOUNTER — Other Ambulatory Visit: Payer: Self-pay

## 2017-03-28 DIAGNOSIS — F1210 Cannabis abuse, uncomplicated: Secondary | ICD-10-CM

## 2017-03-28 DIAGNOSIS — R112 Nausea with vomiting, unspecified: Secondary | ICD-10-CM

## 2017-03-28 LAB — COMPREHENSIVE METABOLIC PANEL
ALT: 15 U/L — AB (ref 17–63)
ANION GAP: 7 (ref 5–15)
AST: 20 U/L (ref 15–41)
Albumin: 3.6 g/dL (ref 3.5–5.0)
Alkaline Phosphatase: 59 U/L (ref 38–126)
BUN: 16 mg/dL (ref 6–20)
CALCIUM: 8.8 mg/dL — AB (ref 8.9–10.3)
CHLORIDE: 108 mmol/L (ref 101–111)
CO2: 27 mmol/L (ref 22–32)
CREATININE: 1.12 mg/dL (ref 0.61–1.24)
Glucose, Bld: 103 mg/dL — ABNORMAL HIGH (ref 65–99)
Potassium: 3.3 mmol/L — ABNORMAL LOW (ref 3.5–5.1)
SODIUM: 142 mmol/L (ref 135–145)
Total Bilirubin: 0.7 mg/dL (ref 0.3–1.2)
Total Protein: 6.5 g/dL (ref 6.5–8.1)

## 2017-03-28 LAB — CBC
HCT: 36.9 % — ABNORMAL LOW (ref 39.0–52.0)
HEMOGLOBIN: 12.5 g/dL — AB (ref 13.0–17.0)
MCH: 30.5 pg (ref 26.0–34.0)
MCHC: 33.9 g/dL (ref 30.0–36.0)
MCV: 90 fL (ref 78.0–100.0)
PLATELETS: 204 10*3/uL (ref 150–400)
RBC: 4.1 MIL/uL — AB (ref 4.22–5.81)
RDW: 13.3 % (ref 11.5–15.5)
WBC: 4.8 10*3/uL (ref 4.0–10.5)

## 2017-03-28 MED ORDER — POTASSIUM CHLORIDE CRYS ER 20 MEQ PO TBCR
40.0000 meq | EXTENDED_RELEASE_TABLET | Freq: Every day | ORAL | Status: DC
  Administered 2017-03-28 – 2017-03-29 (×2): 40 meq via ORAL
  Filled 2017-03-28 (×2): qty 2

## 2017-03-28 NOTE — Progress Notes (Addendum)
PROGRESS NOTE    Benjamin Valdez  KNL:[id number] DOB: [birth year] DOA: 03/27/2017 PCP: Elby Showers, MD   Brief Narrative: 56 year old male with history of chronic marijuana use, angiosarcoma of left leg s/p amputation, HLD, HTN who presents to ED with intractable n/v. Pt was recently discharged for same less than one week prior to admit. Patient's UDS was notable for marijuana.  Assessment & Plan:   Principal Problem:   Intractable nausea and vomiting Active Problems:   Hypercholesterolemia   Hx of BKA, left (HCC)   GAD (generalized anxiety disorder)   GERD (gastroesophageal reflux disease)   Marijuana abuse  -Patient reported mild nausea but no vomiting or abdominal pain today.  Advancing diet to full liquid.  Abdominal exam benign.  Lipid potassium chloride for the management of hypokalemia.  Continue supportive care.  Repeat labs in the morning.  Advance diet if tolerated.  Continue current medical and supportive care. -Educated to quit marijuana use.  He verbalized understanding.  DVT prophylaxis: Lovenox subcutaneous Code Status: Full code Family Communication: No family at bedside Disposition Plan: Currently admitted    Consultants:   None  Procedures: None Antimicrobials: None  Subjective: Seen and examined at bedside.  Denies headache, dizziness, vomiting or abdominal pain.  Has some mild nausea.  Not at baseline.  Objective: Vitals:   03/27/17 1130 03/27/17 1341 03/27/17 2051 03/28/17 0545  BP: (!) 159/88 (!) 167/95 133/90 (!) 133/92  Pulse: 79 76 76 70  Resp: 15 16 16 18   Temp:  99.7 F (37.6 C) 99.8 F (37.7 C) 97.7 F (36.5 C)  TempSrc:  Oral Oral Oral  SpO2: 96% 99% 97% 99%  Weight:  79.4 kg (175 lb)    Height:  6' (1.829 m)      Intake/Output Summary (Last 24 hours) at 03/28/2017 1221 Last data filed at 03/28/2017 1018 Gross per 24 hour  Intake 1400 ml  Output 650 ml  Net 750 ml   Filed Weights   03/27/17 1341  Weight: 79.4 kg (175 lb)     Examination:  General exam: Appears calm and comfortable  Respiratory system: Clear to auscultation. Respiratory effort normal. No wheezing or crackle Cardiovascular system: S1 & S2 heard, RRR.  No pedal edema. Gastrointestinal system: Abdomen is nondistended, soft and nontender. Normal bowel sounds heard. Central nervous system: Alert and oriented. No focal neurological deficits. Skin: No rashes, lesions or ulcers Psychiatry: Judgement and insight appear normal. Mood & affect appropriate.     Data Reviewed: I have personally reviewed following labs and imaging studies  CBC: Recent Labs  Lab 03/27/17 0835 03/27/17 1340 03/28/17 0528  WBC 9.6 5.9 4.8  HGB 14.7 13.6 12.5*  HCT 41.5 39.1 36.9*  MCV 87.4 88.5 90.0  PLT 256 228 790   Basic Metabolic Panel: Recent Labs  Lab 03/27/17 0835 03/27/17 1340 03/28/17 0528  NA 138  --  142  K 3.7  --  3.3*  CL 104  --  108  CO2 22  --  27  GLUCOSE 155*  --  103*  BUN 25*  --  16  CREATININE 1.06 1.10 1.12  CALCIUM 9.5  --  8.8*   GFR: Estimated Creatinine Clearance: 81.8 mL/min (by C-G formula based on SCr of 1.12 mg/dL). Liver Function Tests: Recent Labs  Lab 03/27/17 0835 03/28/17 0528  AST 29 20  ALT 17 15*  ALKPHOS 75 59  BILITOT 0.7 0.7  PROT 7.8 6.5  ALBUMIN 4.6 3.6   Recent Labs  Lab 03/27/17 0835  LIPASE 29   No results for input(s): AMMONIA in the last 168 hours. Coagulation Profile: No results for input(s): INR, PROTIME in the last 168 hours. Cardiac Enzymes: No results for input(s): CKTOTAL, CKMB, CKMBINDEX, TROPONINI in the last 168 hours. BNP (last 3 results) No results for input(s): PROBNP in the last 8760 hours. HbA1C: No results for input(s): HGBA1C in the last 72 hours. CBG: No results for input(s): GLUCAP in the last 168 hours. Lipid Profile: No results for input(s): CHOL, HDL, LDLCALC, TRIG, CHOLHDL, LDLDIRECT in the last 72 hours. Thyroid Function Tests: No results for input(s):  TSH, T4TOTAL, FREET4, T3FREE, THYROIDAB in the last 72 hours. Anemia Panel: No results for input(s): VITAMINB12, FOLATE, FERRITIN, TIBC, IRON, RETICCTPCT in the last 72 hours. Sepsis Labs: No results for input(s): PROCALCITON, LATICACIDVEN in the last 168 hours.  No results found for this or any previous visit (from the past 240 hour(s)).       Radiology Studies: No results found.      Scheduled Meds: . amitriptyline  50-100 mg Oral QHS  . enoxaparin (LOVENOX) injection  40 mg Subcutaneous Q24H  . pantoprazole  40 mg Oral Daily  . PARoxetine  45 mg Oral Daily  . potassium chloride  40 mEq Oral Daily  . rosuvastatin  20 mg Oral Daily   Continuous Infusions: . sodium chloride 100 mL/hr at 03/28/17 0127     LOS: 1 day    Yashar Inclan Tanna Furry, MD [facility] Pager [phone]  If 7PM-7AM, please contact night-coverage [email or web address] Password [email or web address] 03/28/2017, 12:21 PM

## 2017-03-29 LAB — BASIC METABOLIC PANEL
ANION GAP: 6 (ref 5–15)
BUN: 17 mg/dL (ref 6–20)
CHLORIDE: 107 mmol/L (ref 101–111)
CO2: 28 mmol/L (ref 22–32)
CREATININE: 1.2 mg/dL (ref 0.61–1.24)
Calcium: 8.9 mg/dL (ref 8.9–10.3)
GFR calc non Af Amer: >60 mL/min (ref >60)
Glucose, Bld: 97 mg/dL (ref 65–99)
Potassium: 4.1 mmol/L (ref 3.5–5.1)
Sodium: 141 mmol/L (ref 135–145)

## 2017-03-29 NOTE — Discharge Summary (Signed)
Physician Discharge Summary  Mindy Behnken JKK:[id number] DOB: 1960/07/29 DOA: 03/27/2017  PCP: Elby Showers, MD  Admit date: 03/27/2017 Discharge date: 03/29/2017  Admitted From:home Disposition:home  Recommendations for Outpatient Follow-up:  1. Follow up with PCP in 1-2 weeks 2. Please obtain BMP/CBC in one week  Home Health:no Equipment/Devices:no Discharge Condition:stable CODE STATUS:full code Diet recommendation:regular  Brief/Interim Summary: 56 year old male with history of chronic marijuana use, angiosarcoma of left leg s/p amputation, HLD, HTN who presents to ED with intractable n/v. Pt was recently discharged for same less than one week prior to admit. Patient's UDS was notable for marijuana.  Patient's nausea vomiting likely possible gastroenteritis versus likely in the setting of marijuana use.  Patient was educated to quit marijuana use.  He verbalized understanding.  His symptoms improved.  He is able to tolerate diet well.  He has no nausea vomiting or diarrhea today.  Abdominal exam benign.  Hypokalemia improved.  Recommend to follow-up with PCP.  Patient is medically stable on discharge.  Discharge Diagnoses:  Principal Problem:   Intractable nausea and vomiting Active Problems:   Hypercholesterolemia   Hx of BKA, left (HCC)   GAD (generalized anxiety disorder)   GERD (gastroesophageal reflux disease)   Marijuana abuse    Discharge Instructions  Discharge Instructions    Call MD for:  difficulty breathing, headache or visual disturbances   Complete by:  As directed    Call MD for:  extreme fatigue   Complete by:  As directed    Call MD for:  hives   Complete by:  As directed    Call MD for:  persistant dizziness or light-headedness   Complete by:  As directed    Call MD for:  persistant nausea and vomiting   Complete by:  As directed    Call MD for:  temperature >100.4   Complete by:  As directed    Diet general   Complete by:  As directed     Increase activity slowly   Complete by:  As directed      Allergies as of 03/29/2017   No Known Allergies     Medication List    TAKE these medications   amitriptyline 50 MG tablet Commonly known as:  ELAVIL Take 50-100 mg by mouth at bedtime.   diazepam 5 MG tablet Commonly known as:  VALIUM TAKE 1 TABLET BY MOUTH EVERY 8 HOURS AS NEEDED FOR ANXIETY   famotidine 20 MG tablet Commonly known as:  PEPCID Take 1 tablet (20 mg total) by mouth 2 (two) times daily.   Melatonin 10 MG Tabs Take 10 mg by mouth at bedtime.   omeprazole 40 MG capsule Commonly known as:  PRILOSEC Take 1 capsule (40 mg total) by mouth daily.   ondansetron 4 MG tablet Commonly known as:  ZOFRAN Take 1 tablet (4 mg total) by mouth every 8 (eight) hours as needed for nausea or vomiting.   PARoxetine 30 MG tablet Commonly known as:  PAXIL Take 45 mg by mouth every morning.   promethazine 25 MG suppository Commonly known as:  PHENERGAN Place 1 suppository (25 mg total) rectally every 6 (six) hours as needed for nausea or vomiting.   rosuvastatin 20 MG tablet Commonly known as:  CRESTOR TAKE ONE TABLET BY MOUTH ONE TIME DAILY.      Follow-up Information    Baxley, Cresenciano Lick, MD. Schedule an appointment as soon as possible for a visit in 1 week(s).   Specialty:  Internal Medicine  Contact information: [phone] [phone]          No Known Allergies  Consultations: None  Procedures/Studies: None  Subjective: Seen and examined at bedside.  Reported doing well.  Tolerating diet well.  Nausea, vomiting, abdominal pain.  No bowel movement since yesterday.  Discharge Exam: Vitals:   03/28/17 2224 03/29/17 0513  BP: 118/78 (!) 117/91  Pulse: 81 68  Resp: 18 18  Temp: 98.3 F (36.8 C) 98.3 F (36.8 C)  SpO2: 96% 97%   Vitals:   03/28/17 0545 03/28/17 1501 03/28/17 2224 03/29/17 0513  BP: (!) 133/92 125/84 118/78 (!) 117/91  Pulse: 70 72 81  68  Resp: 18 18 18 18   Temp: 97.7 F (36.5 C) 99 F (37.2 C) 98.3 F (36.8 C) 98.3 F (36.8 C)  TempSrc: Oral Oral Oral Oral  SpO2: 99% 99% 96% 97%  Weight:      Height:        General: Pt is alert, awake, not in acute distress Cardiovascular: RRR, S1/S2 +, no rubs, no gallops Respiratory: CTA bilaterally, no wheezing, no rhonchi Abdominal: Soft, NT, ND, bowel sounds + Extremities: no edema, no cyanosis    The results of significant diagnostics from this hospitalization (including imaging, microbiology, ancillary and laboratory) are listed below for reference.     Microbiology: No results found for this or any previous visit (from the past 240 hour(s)).   Labs: BNP (last 3 results) No results for input(s): BNP in the last 8760 hours. Basic Metabolic Panel: Recent Labs  Lab 03/27/17 0835 03/27/17 1340 03/28/17 0528 03/29/17 0444  NA 138  --  142 141  K 3.7  --  3.3* 4.1  CL 104  --  108 107  CO2 22  --  27 28  GLUCOSE 155*  --  103* 97  BUN 25*  --  16 17  CREATININE 1.06 1.10 1.12 1.20  CALCIUM 9.5  --  8.8* 8.9   Liver Function Tests: Recent Labs  Lab 03/27/17 0835 03/28/17 0528  AST 29 20  ALT 17 15*  ALKPHOS 75 59  BILITOT 0.7 0.7  PROT 7.8 6.5  ALBUMIN 4.6 3.6   Recent Labs  Lab 03/27/17 0835  LIPASE 29   No results for input(s): AMMONIA in the last 168 hours. CBC: Recent Labs  Lab 03/27/17 0835 03/27/17 1340 03/28/17 0528  WBC 9.6 5.9 4.8  HGB 14.7 13.6 12.5*  HCT 41.5 39.1 36.9*  MCV 87.4 88.5 90.0  PLT 256 228 204   Cardiac Enzymes: No results for input(s): CKTOTAL, CKMB, CKMBINDEX, TROPONINI in the last 168 hours. BNP: Invalid input(s): POCBNP CBG: No results for input(s): GLUCAP in the last 168 hours. D-Dimer No results for input(s): DDIMER in the last 72 hours. Hgb A1c No results for input(s): HGBA1C in the last 72 hours. Lipid Profile No results for input(s): CHOL, HDL, LDLCALC, TRIG, CHOLHDL, LDLDIRECT in the last 72  hours. Thyroid function studies No results for input(s): TSH, T4TOTAL, T3FREE, THYROIDAB in the last 72 hours.  Invalid input(s): FREET3 Anemia work up No results for input(s): VITAMINB12, FOLATE, FERRITIN, TIBC, IRON, RETICCTPCT in the last 72 hours. Urinalysis    Component Value Date/Time   COLORURINE YELLOW 03/27/2017 1000   APPEARANCEUR CLEAR 03/27/2017 1000   LABSPEC 1.016 03/27/2017 1000   PHURINE 8.0 03/27/2017 1000   GLUCOSEU NEGATIVE 03/27/2017 1000   HGBUR NEGATIVE 03/27/2017 1000   BILIRUBINUR NEGATIVE 03/27/2017 1000   BILIRUBINUR NEG  11/16/2016 1412   KETONESUR NEGATIVE 03/27/2017 1000   PROTEINUR NEGATIVE 03/27/2017 1000   UROBILINOGEN 0.2 11/16/2016 1412   UROBILINOGEN 0.2 06/29/2011 0918   NITRITE NEGATIVE 03/27/2017 1000   LEUKOCYTESUR NEGATIVE 03/27/2017 1000   Sepsis Labs Invalid input(s): PROCALCITONIN,  WBC,  LACTICIDVEN Microbiology No results found for this or any previous visit (from the past 240 hour(s)).   Time coordinating discharge: 26 minutes  SIGNED:   Rosita Fire, MD  [facility] 03/29/2017, 12:59 PM  If 7PM-7AM, please contact night-coverage [email or web address] Password [email or web address]

## 2017-03-29 NOTE — Progress Notes (Addendum)
Discharge instructions reviewed with patient. All questions answered. Patient wheeled down to vehicle with belongings.

## 2017-04-14 ENCOUNTER — Other Ambulatory Visit: Payer: Self-pay | Admitting: Internal Medicine

## 2017-04-14 NOTE — Telephone Encounter (Signed)
Called in to [facility], [address] [phone] (Phone) [phone] (Fax)

## 2017-04-14 NOTE — Telephone Encounter (Signed)
Call in #[phone] with no refill

## 2017-09-06 ENCOUNTER — Other Ambulatory Visit: Payer: Medicare Other | Admitting: Internal Medicine

## 2017-09-06 DIAGNOSIS — F39 Unspecified mood [affective] disorder: Secondary | ICD-10-CM

## 2017-09-06 LAB — CBC WITH DIFFERENTIAL/PLATELET
BASOS PCT: 1.1 %
Basophils Absolute: 48 cells/uL (ref 0–200)
Eosinophils Absolute: 264 cells/uL (ref 15–500)
Eosinophils Relative: 6 %
HCT: 42.9 % (ref 38.5–50.0)
HEMOGLOBIN: 14.4 g/dL (ref 13.2–17.1)
Lymphs Abs: 1214 cells/uL (ref 850–3900)
MCH: 29.7 pg (ref 27.0–33.0)
MCHC: 33.6 g/dL (ref 32.0–36.0)
MCV: 88.5 fL (ref 80.0–100.0)
MPV: 11.3 fL (ref 7.5–12.5)
Monocytes Relative: 7.6 %
NEUTROS ABS: 2539 {cells}/uL (ref 1500–7800)
Neutrophils Relative %: 57.7 %
PLATELETS: 214 10*3/uL (ref 140–400)
RBC: 4.85 10*6/uL (ref 4.20–5.80)
RDW: 13 % (ref 11.0–15.0)
TOTAL LYMPHOCYTE: 27.6 %
WBC: 4.4 10*3/uL (ref 3.8–10.8)
WBCMIX: 334 {cells}/uL (ref 200–950)

## 2017-09-06 LAB — COMPLETE METABOLIC PANEL WITH GFR
AG Ratio: 1.7 (calc) (ref 1.0–2.5)
ALT: 12 U/L (ref 9–46)
AST: 16 U/L (ref 10–35)
Albumin: 4.3 g/dL (ref 3.6–5.1)
Alkaline phosphatase (APISO): 68 U/L (ref 40–115)
BUN: 25 mg/dL (ref 7–25)
CALCIUM: 9.9 mg/dL (ref 8.6–10.3)
CO2: 30 mmol/L (ref 20–32)
CREATININE: 1.27 mg/dL (ref 0.70–1.33)
Chloride: 103 mmol/L (ref 98–110)
GFR, EST NON AFRICAN AMERICAN: 63 mL/min/{1.73_m2} (ref >=60)
GFR, Est African American: 73 mL/min/{1.73_m2} (ref >=60)
GLOBULIN: 2.6 g/dL (ref 1.9–3.7)
GLUCOSE: 110 mg/dL — AB (ref 65–99)
Potassium: 4.8 mmol/L (ref 3.5–5.3)
SODIUM: 140 mmol/L (ref 135–146)
Total Bilirubin: 0.4 mg/dL (ref 0.2–1.2)
Total Protein: 6.9 g/dL (ref 6.1–8.1)

## 2017-09-06 LAB — LIPID PANEL
CHOL/HDL RATIO: 3.8 (calc) (ref <5.0)
Cholesterol: 215 mg/dL — ABNORMAL HIGH (ref <200)
HDL: 56 mg/dL (ref >40)
LDL CHOLESTEROL (CALC): 141 mg/dL — AB
NON-HDL CHOLESTEROL (CALC): 159 mg/dL — AB (ref <130)
Triglycerides: 82 mg/dL (ref <150)

## 2017-12-17 ENCOUNTER — Other Ambulatory Visit: Payer: Self-pay | Admitting: Internal Medicine

## 2018-03-29 ENCOUNTER — Other Ambulatory Visit: Payer: Self-pay | Admitting: Internal Medicine

## 2018-11-29 ENCOUNTER — Other Ambulatory Visit: Payer: Medicare Other | Admitting: Internal Medicine

## 2018-11-29 ENCOUNTER — Other Ambulatory Visit: Payer: Self-pay

## 2018-11-29 DIAGNOSIS — Z0000 Encounter for general adult medical examination without abnormal findings: Secondary | ICD-10-CM

## 2018-11-29 DIAGNOSIS — Z125 Encounter for screening for malignant neoplasm of prostate: Secondary | ICD-10-CM

## 2018-11-29 DIAGNOSIS — C419 Malignant neoplasm of bone and articular cartilage, unspecified: Secondary | ICD-10-CM

## 2018-11-29 DIAGNOSIS — Z86010 Personal history of colonic polyps: Secondary | ICD-10-CM

## 2018-11-29 DIAGNOSIS — E7849 Other hyperlipidemia: Secondary | ICD-10-CM

## 2018-11-29 DIAGNOSIS — F411 Generalized anxiety disorder: Secondary | ICD-10-CM

## 2018-11-29 DIAGNOSIS — R7302 Impaired glucose tolerance (oral): Secondary | ICD-10-CM

## 2018-11-30 ENCOUNTER — Telehealth: Payer: Self-pay

## 2018-11-30 LAB — HEMOGLOBIN A1C
Hgb A1c MFr Bld: 5.8 % of total Hgb — ABNORMAL HIGH (ref <5.7)
Mean Plasma Glucose: 120 (calc)
eAG (mmol/L): 6.6 (calc)

## 2018-11-30 LAB — COMPLETE METABOLIC PANEL WITH GFR
AG Ratio: 1.8 (calc) (ref 1.0–2.5)
ALT: 15 U/L (ref 9–46)
AST: 20 U/L (ref 10–35)
Albumin: 4.6 g/dL (ref 3.6–5.1)
Alkaline phosphatase (APISO): 60 U/L (ref 35–144)
BUN: 25 mg/dL (ref 7–25)
CO2: 25 mmol/L (ref 20–32)
Calcium: 9.7 mg/dL (ref 8.6–10.3)
Chloride: 104 mmol/L (ref 98–110)
Creat: 1.21 mg/dL (ref 0.70–1.33)
GFR, Est African American: 77 mL/min/{1.73_m2} (ref >=60)
GFR, Est Non African American: 66 mL/min/{1.73_m2} (ref >=60)
Globulin: 2.5 g/dL (calc) (ref 1.9–3.7)
Glucose, Bld: 111 mg/dL — ABNORMAL HIGH (ref 65–99)
Potassium: 4.4 mmol/L (ref 3.5–5.3)
Sodium: 139 mmol/L (ref 135–146)
Total Bilirubin: 0.5 mg/dL (ref 0.2–1.2)
Total Protein: 7.1 g/dL (ref 6.1–8.1)

## 2018-11-30 LAB — CBC WITH DIFFERENTIAL/PLATELET
Absolute Monocytes: 301 cells/uL (ref 200–950)
Basophils Absolute: 32 cells/uL (ref 0–200)
Basophils Relative: 0.9 %
Eosinophils Absolute: 200 cells/uL (ref 15–500)
Eosinophils Relative: 5.7 %
HCT: 43.4 % (ref 38.5–50.0)
Hemoglobin: 14.5 g/dL (ref 13.2–17.1)
Lymphs Abs: 1082 cells/uL (ref 850–3900)
MCH: 30.7 pg (ref 27.0–33.0)
MCHC: 33.4 g/dL (ref 32.0–36.0)
MCV: 91.9 fL (ref 80.0–100.0)
MPV: 10.8 fL (ref 7.5–12.5)
Monocytes Relative: 8.6 %
Neutro Abs: 1887 cells/uL (ref 1500–7800)
Neutrophils Relative %: 53.9 %
Platelets: 241 10*3/uL (ref 140–400)
RBC: 4.72 10*6/uL (ref 4.20–5.80)
RDW: 13.1 % (ref 11.0–15.0)
Total Lymphocyte: 30.9 %
WBC: 3.5 10*3/uL — ABNORMAL LOW (ref 3.8–10.8)

## 2018-11-30 LAB — LIPID PANEL
Cholesterol: 213 mg/dL — ABNORMAL HIGH (ref <200)
HDL: 53 mg/dL (ref >=40)
LDL Cholesterol (Calc): 140 mg/dL (calc) — ABNORMAL HIGH
Non-HDL Cholesterol (Calc): 160 mg/dL (calc) — ABNORMAL HIGH (ref <130)
Total CHOL/HDL Ratio: 4 (calc) (ref <5.0)
Triglycerides: 93 mg/dL (ref <150)

## 2018-11-30 LAB — PSA: PSA: 1.1 ng/mL (ref <=4.0)

## 2018-11-30 NOTE — Telephone Encounter (Signed)
Will discuss on Friday

## 2018-11-30 NOTE — Telephone Encounter (Signed)
Patient has CPE on Friday and he wants to know if you can write him a letter so he can be allowed in a gym to workout.

## 2018-12-02 ENCOUNTER — Other Ambulatory Visit: Payer: Self-pay

## 2018-12-02 ENCOUNTER — Encounter: Payer: Self-pay | Admitting: Internal Medicine

## 2018-12-02 ENCOUNTER — Ambulatory Visit (INDEPENDENT_AMBULATORY_CARE_PROVIDER_SITE_OTHER): Payer: Medicare Other | Admitting: Internal Medicine

## 2018-12-02 VITALS — BP 140/90 | HR 101 | Temp 98.2°F | Ht 72.0 in | Wt 168.0 lb

## 2018-12-02 DIAGNOSIS — Z89512 Acquired absence of left leg below knee: Secondary | ICD-10-CM | POA: Diagnosis not present

## 2018-12-02 DIAGNOSIS — R1115 Cyclical vomiting syndrome unrelated to migraine: Secondary | ICD-10-CM | POA: Diagnosis not present

## 2018-12-02 DIAGNOSIS — F4321 Adjustment disorder with depressed mood: Secondary | ICD-10-CM

## 2018-12-02 DIAGNOSIS — R7302 Impaired glucose tolerance (oral): Secondary | ICD-10-CM | POA: Diagnosis not present

## 2018-12-02 DIAGNOSIS — Z0000 Encounter for general adult medical examination without abnormal findings: Secondary | ICD-10-CM | POA: Diagnosis not present

## 2018-12-02 DIAGNOSIS — E7800 Pure hypercholesterolemia, unspecified: Secondary | ICD-10-CM

## 2018-12-02 DIAGNOSIS — F411 Generalized anxiety disorder: Secondary | ICD-10-CM

## 2018-12-02 DIAGNOSIS — Z8659 Personal history of other mental and behavioral disorders: Secondary | ICD-10-CM

## 2018-12-02 LAB — POCT URINALYSIS DIPSTICK
Appearance: NEGATIVE
Bilirubin, UA: NEGATIVE
Blood, UA: NEGATIVE
Glucose, UA: NEGATIVE
Leukocytes, UA: NEGATIVE
Nitrite, UA: NEGATIVE
Odor: NEGATIVE
Protein, UA: POSITIVE — AB
Spec Grav, UA: 1.015 (ref 1.010–1.025)
Urobilinogen, UA: 0.2 E.U./dL
pH, UA: 6 (ref 5.0–8.0)

## 2018-12-02 NOTE — Patient Instructions (Signed)
Have flu vaccine in 2 weeks.  Return in 6 months for lipid panel liver functions and hemoglobin A1c with office visit.  Note given to exercise in gym but needs to consider risk with 2 elderly parents

## 2018-12-02 NOTE — Progress Notes (Signed)
Subjective:    Patient ID: Benjamin Valdez, male    DOB: Mar 01, 1961, 58 y.o.   MRN: [id number]  HPI   58 year old White Male for medicare wellness, routine health maintenance and evaluation of medical issues.  He has a history of angiosarcoma left leg and is status post [place].  He receives disability benefits and is on Medicare.  He has a prosthesis.  He is seen annually at [facility] by Dr. Montez Morita regarding history of angiosarcoma of the left lower extremity.  No history of recurrence.  He has a history of cyclical vomiting and has to go to the emergency department from time to time to get antinausea medication and IV fluids.  He is on chronic Elavil therapy.  Does not have migraine headaches associated with the cyclical vomiting.  Remote history of fractured clavicle  No known drug allergies  He swims and works out in [employer] however these have been close to the pandemic.  He would like a note to go back to the gym.  Discussion today regarding this.  He has 2 elderly parents that he has to be concerned about if he returns to the gym.  He will investigate this set up at the [facility] which is the [facility] and decide if he would like to proceed with that.  I did give him a note.  A good friend was killed in an automobile accident recently.  He is having a grief reaction.  That explains some of the answers to his questions regarding depression I think.  Spoke with him about this today.  Social history: Lives with parents.  Never married.  Does not smoke.  No alcohol consumption.  4-year college degree.    Family history: 1 brother with Crohn's disease.  Mother with history of osteoporosis hyperlipidemia atrial fibrillation on chronic anticoagulation mitral regurgitation history of MI and stent placement, diabetes mellitus who is 78 years old.  His father is in his 68s with history of chronic back pain from lumbar stenosis, bladder cancer, diabetes mellitus, coronary stent placement,  hyperlipidemia, hypertension COPD and obstructive sleep apnea.  One sister age 30 in good health.  Sister son has a history of drug addiction.  Past medical history: History of depression treated with SSRI.  He also has a therapist.  Takes PPI for GE reflux.  History of hyperlipidemia treated with Crestor.  He has an elevated serum glucose and mild glucose intolerance at 5.8%.  History of MRI of the left shoulder 2012 showing a probable labral tear.  Normal nuclear stress test in 2009.     Review of Systems  Constitutional: Negative.   Respiratory: Negative.   Cardiovascular: Negative.   Gastrointestinal: Negative.   Genitourinary: Negative.   Neurological: Negative.   Psychiatric/Behavioral: Positive for dysphoric mood.       Objective:   Physical Exam Blood pressure 140/90, pulse 101, temperature 98.2 degrees orally BMI 22.78 weight 168 pounds  Skin warm and dry.  Nodes none.  TMs and pharynx are clear.  Neck is supple.  Chest clear to auscultation.  Cardiac exam regular rate and rhythm normal S1 and S2.  Abdomen no hepatosplenomegaly masses or tenderness.  Prostate is normal without nodules.  Has left BKA.  Has a bit of a dysphoric mood because [place] has been closed and he enjoyed exercising and getting out of the house daily.       Assessment & Plan:  Status post left BKA for angiosarcoma 1998 with prosthesis  Dysphoria due to not being able to go to gym during the pandemic.  Note has been provided for him and he will check out the situation at the gym and make his own decision regarding returning.  History of cyclical vomiting-see above treated with Elavil and antinausea medication  Impaired glucose tolerance Treated  Hyperlipidemia treated with statin medication stable  Depression treated with SSRI  Marijuana use  Anxiety treated with Valium  Grief reaction over loss of friend-discussed today  Plan: Return in 6 months or as needed.  Recommend annual flu  vaccine.  Had colonoscopy in 2014.  Continue current medications as previously prescribed.  He is on Crestor 20 mg daily.  LDL is 140.  PSA is normal.  Hemoglobin A1c 5.8%.  Continue to watch diet.  It will improve I think when he is able to get back to exercising in the gym.  Subjective:   Patient presents for Medicare Annual/Subsequent preventive examination.  Review Past Medical/Family/Social: See above   Risk Factors  Current exercise habits: Used to work at a gym daily until pandemic Dietary issues discussed: Low-fat low carbohydrate  Cardiac risk factors: Impaired glucose tolerance, family history, hyperlipidemia  Depression Screen  (Note: if answer to either of the following is "Yes", a more complete depression screening is indicated)   Over the past two weeks, have you felt down, depressed or hopeless?  Yes stuck at home Over the past two weeks, have you felt little interest or pleasure in doing things?  Yes cannot go to gym Have you lost interest or pleasure in daily life? No Do you often feel hopeless?  Lately during the pandemic-he has Do you cry easily over simple problems? No   Activities of Daily Living  In your present state of health, do you have any difficulty performing the following activities?:   Driving? No  Managing money? No  Feeding yourself? No  Getting from bed to chair? No  Climbing a flight of stairs? No  Preparing food and eating?: No  Bathing or showering? No  Getting dressed: No  Getting to the toilet? No  Using the toilet:No  Moving around from place to place: No  In the past year have you fallen or had a near fall?:No  Are you sexually active? yes Do you have more than one partner? No   Hearing Difficulties: No  Do you often ask people to speak up or repeat themselves? No  Do you experience ringing or noises in your ears? No  Do you have difficulty understanding soft or whispered voices? No  Do you feel that you have a problem with memory?  No Do you often misplace items? No    Home Safety:  Do you have a smoke alarm at your residence? Yes Do you have grab bars in the bathroom?  Yes Do you have throw rugs in your house?  None   Cognitive Testing  Alert? Yes Normal Appearance?Yes  Oriented to person? Yes Place? Yes  Time? Yes  Recall of three objects? Yes  Can perform simple calculations? Yes  Displays appropriate judgment?Yes  Can read the correct time from a watch face?Yes   List the Names of Other Physician/Practitioners you currently use:  See referral list for the physicians patient is currently seeing.     Review of Systems: See above   Objective:     General appearance: Well-developed well-nourished status post BKA Head: Normocephalic, without obvious abnormality, atraumatic  Eyes: conj clear, EOMi PEERLA  Ears: normal  TM's and external ear canals both ears  Nose: Nares normal. Septum midline. Mucosa normal. No drainage or sinus tenderness.  Throat: lips, mucosa, and tongue normal; teeth and gums normal  Neck: no adenopathy, no carotid bruit, no JVD, supple, symmetrical, trachea midline and thyroid not enlarged, symmetric, no tenderness/mass/nodules  No CVA tenderness.  Lungs: clear to auscultation bilaterally  Breasts: normal appearance Heart: regular rate and rhythm, S1, S2 normal, no murmur, click, rub or gallop  Abdomen: soft, non-tender; bowel sounds normal; no masses, no organomegaly  Musculoskeletal: ROM normal in all joints, no crepitus, no deformity, Normal muscle strengthen. Back  is symmetric, no curvature. Skin: Skin color, texture, turgor normal. No rashes or lesions  Lymph nodes: Cervical, supraclavicular, and axillary nodes normal.  Neurologic: CN 2 -12 Normal, Normal symmetric reflexes. Normal coordination and gait  Psych: Alert & Oriented x 3, Mood appear stable.    Assessment:    Annual wellness medicare exam   Plan:    During the course of the visit the patient was educated  and counseled about appropriate screening and preventive services including:   Annual flu vaccine     Patient Instructions (the written plan) was given to the patient.  Medicare Attestation  I have personally reviewed:  The patient's medical and social history  Their use of alcohol, tobacco or illicit drugs  Their current medications and supplements  The patient's functional ability including ADLs,fall risks, home safety risks, cognitive, and hearing and visual impairment  Diet and physical activities  Evidence for depression or mood disorders  The patient's weight, height, BMI, and visual acuity have been recorded in the chart. I have made referrals, counseling, and provided education to the patient based on review of the above and I have provided the patient with a written personalized care plan for preventive services.

## 2018-12-17 ENCOUNTER — Other Ambulatory Visit: Payer: Self-pay | Admitting: Internal Medicine

## 2019-03-07 ENCOUNTER — Telehealth: Payer: Self-pay | Admitting: Internal Medicine

## 2019-03-07 NOTE — Telephone Encounter (Signed)
Called patient back he verbalized understanding

## 2019-03-07 NOTE — Telephone Encounter (Signed)
He should see orthopedist for this.

## 2019-03-07 NOTE — Telephone Encounter (Signed)
Benjamin Valdez [phone]  Benjamin Valdez called to say that lately he has been having a lot of right hip pain especially when he goes from sitting to standing. He would like to come in and see you to discuss what he should do. Should he get xray

## 2019-04-04 ENCOUNTER — Other Ambulatory Visit: Payer: Self-pay | Admitting: Internal Medicine

## 2019-05-06 ENCOUNTER — Ambulatory Visit: Payer: Medicare Other | Attending: Internal Medicine

## 2019-05-06 DIAGNOSIS — Z23 Encounter for immunization: Secondary | ICD-10-CM | POA: Insufficient documentation

## 2019-05-06 NOTE — Progress Notes (Signed)
   Covid-19 Vaccination Clinic  Name:  Benjamin Valdez    MRN: [id number] DOB: [birth year]  05/06/2019  [name] was observed post Covid-19 immunization for 15 minutes without incidence. He was provided with Vaccine Information Sheet and instruction to access the V-Safe system.   [name] was instructed to call 911 with any severe reactions post vaccine: [name] Kitchen Difficulty breathing  . Swelling of your face and throat  . A fast heartbeat  . A bad rash all over your body  . Dizziness and weakness    Immunizations Administered    Name Date Dose VIS Date Route   Pfizer COVID-19 Vaccine 05/06/2019 12:58 PM 0.3 mL 03/31/2019 Intramuscular   Manufacturer: [place]   Lot: [id number]   [place]: [id number]

## 2019-05-15 IMAGING — CR DG HIP (WITH OR WITHOUT PELVIS) 2-3V*R*
2 series · 2 of 2 positions shown · non-contrast
Comparison: None.

CLINICAL DATA: Right hip pain for the past 6-8 weeks. No known
injury.

EXAM:
DG HIP (WITH OR WITHOUT PELVIS) 2-3V RIGHT

[w hip ap right]
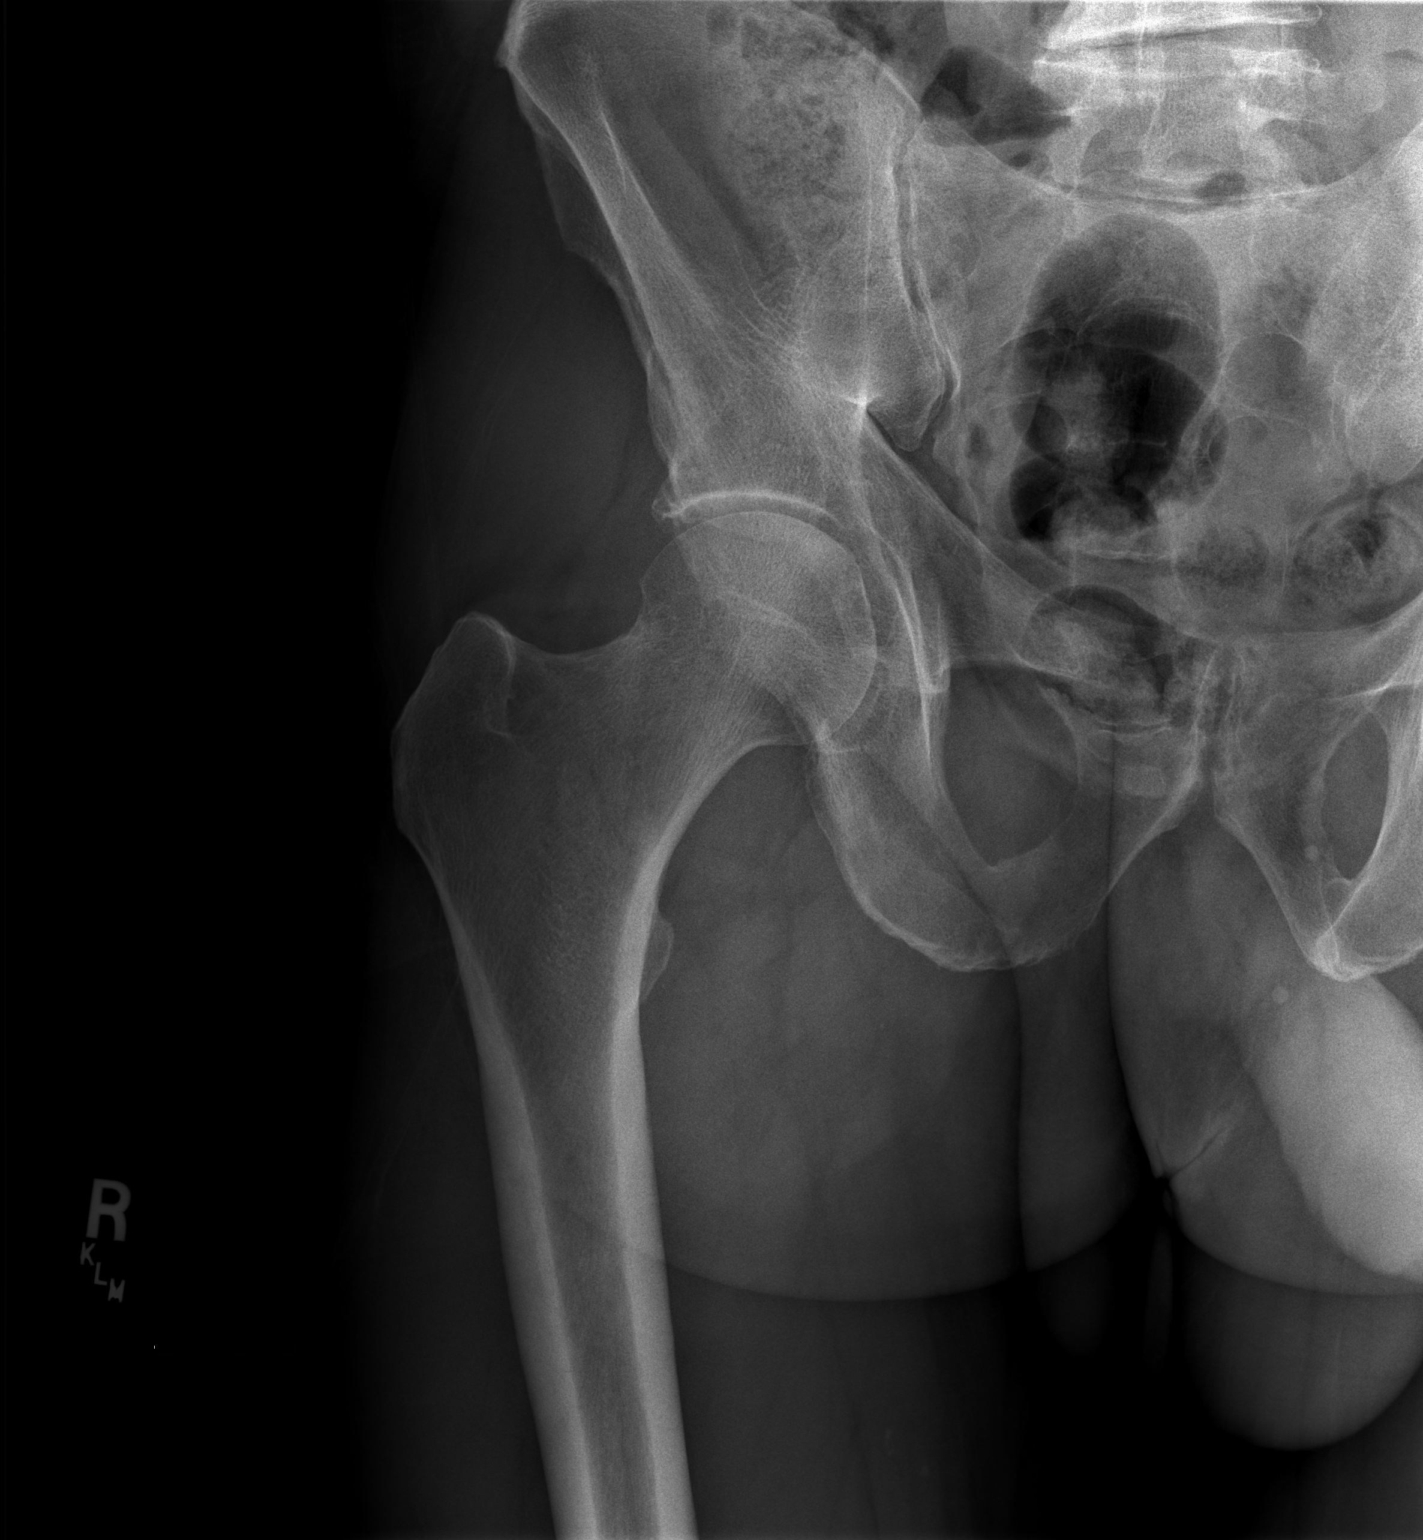

[w hip frog right]
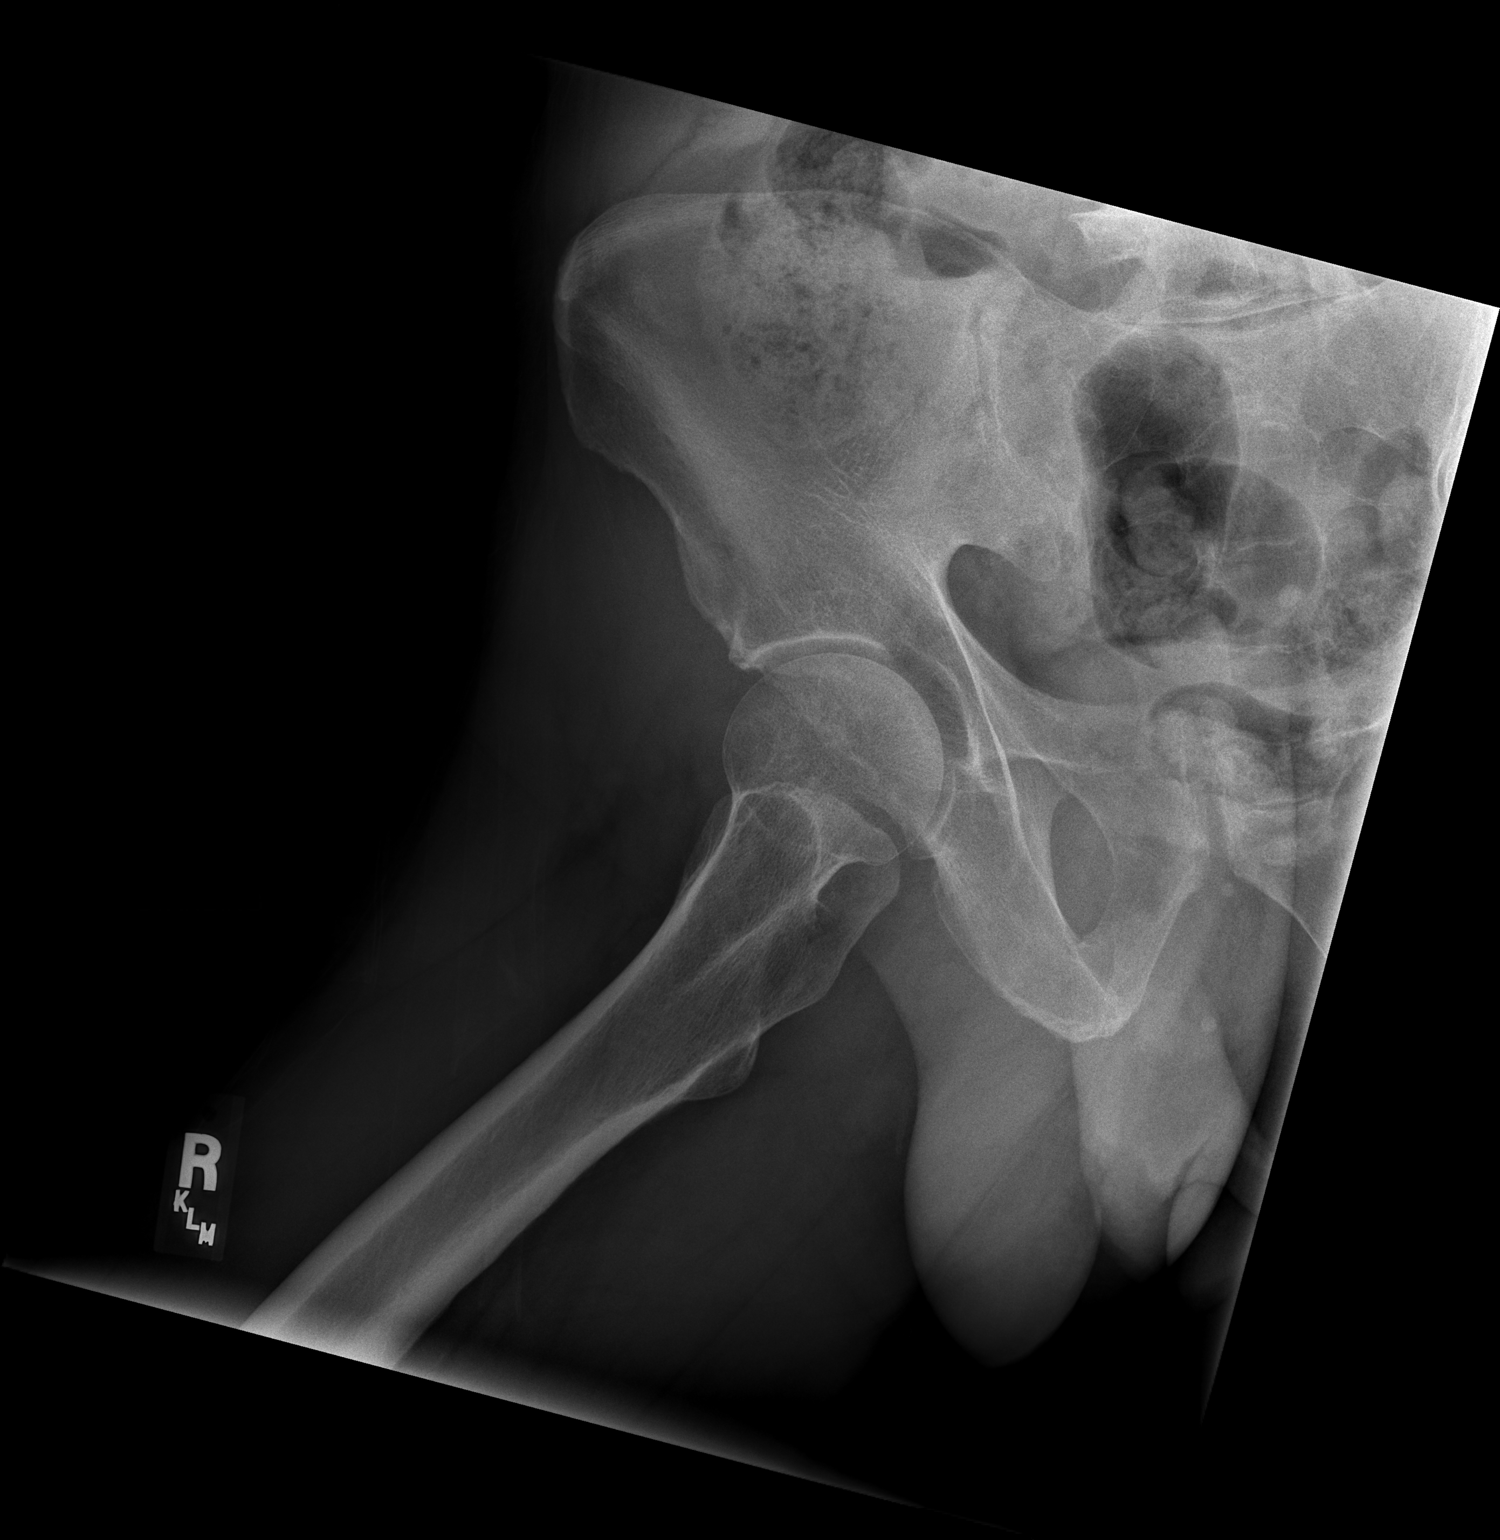

[2 of 2 positions shown; findings below may reference images not displayed]

FINDINGS: Normal appearing right hip without fracture or dislocation. Lower
lumbar spine degenerative changes.
IMPRESSION: Normal appearing right hip. Lower lumbar spine degenerative changes.

## 2019-05-24 ENCOUNTER — Ambulatory Visit: Payer: Medicare Other | Attending: Internal Medicine

## 2019-05-24 ENCOUNTER — Ambulatory Visit: Payer: Medicare Other

## 2019-05-24 DIAGNOSIS — Z23 Encounter for immunization: Secondary | ICD-10-CM | POA: Insufficient documentation

## 2019-05-24 NOTE — Progress Notes (Signed)
   Covid-19 Vaccination Clinic  Name:  Umar Hoovler    MRN: [id number] DOB: Jun 26, 1960  05/24/2019  Mr. Larsh was observed post Covid-19 immunization for 15 minutes without incidence. He was provided with Vaccine Information Sheet and instruction to access the V-Safe system.   Mr. Samec was instructed to call 911 with any severe reactions post vaccine: [name] Kitchen Difficulty breathing  . Swelling of your face and throat  . A fast heartbeat  . A bad rash all over your body  . Dizziness and weakness    Immunizations Administered    Name Date Dose VIS Date Route   Pfizer COVID-19 Vaccine 05/24/2019 10:58 AM 0.3 mL 03/31/2019 Intramuscular   Manufacturer: [place]   Lot: [id number]   [place]: [id number]

## 2019-06-01 ENCOUNTER — Other Ambulatory Visit: Payer: Medicare Other | Admitting: Internal Medicine

## 2019-06-01 ENCOUNTER — Other Ambulatory Visit: Payer: Self-pay

## 2019-06-01 DIAGNOSIS — E7800 Pure hypercholesterolemia, unspecified: Secondary | ICD-10-CM

## 2019-06-01 DIAGNOSIS — R7302 Impaired glucose tolerance (oral): Secondary | ICD-10-CM

## 2019-06-02 ENCOUNTER — Other Ambulatory Visit: Payer: Medicare Other | Admitting: Internal Medicine

## 2019-06-02 LAB — HEPATIC FUNCTION PANEL
AG Ratio: 2 (calc) (ref 1.0–2.5)
ALT: 12 U/L (ref 9–46)
AST: 16 U/L (ref 10–35)
Albumin: 4.9 g/dL (ref 3.6–5.1)
Alkaline phosphatase (APISO): 73 U/L (ref 35–144)
Bilirubin, Direct: 0.1 mg/dL (ref 0.0–0.2)
Globulin: 2.4 g/dL (calc) (ref 1.9–3.7)
Indirect Bilirubin: 0.4 mg/dL (calc) (ref 0.2–1.2)
Total Bilirubin: 0.5 mg/dL (ref 0.2–1.2)
Total Protein: 7.3 g/dL (ref 6.1–8.1)

## 2019-06-02 LAB — HEMOGLOBIN A1C
Hgb A1c MFr Bld: 6.1 % of total Hgb — ABNORMAL HIGH (ref <5.7)
Mean Plasma Glucose: 128 (calc)
eAG (mmol/L): 7.1 (calc)

## 2019-06-02 LAB — LIPID PANEL
Cholesterol: 186 mg/dL (ref <200)
HDL: 57 mg/dL (ref >=40)
LDL Cholesterol (Calc): 112 mg/dL (calc) — ABNORMAL HIGH
Non-HDL Cholesterol (Calc): 129 mg/dL (calc) (ref <130)
Total CHOL/HDL Ratio: 3.3 (calc) (ref <5.0)
Triglycerides: 77 mg/dL (ref <150)

## 2019-06-05 ENCOUNTER — Ambulatory Visit: Payer: Medicare Other | Admitting: Internal Medicine

## 2019-07-25 ENCOUNTER — Telehealth: Payer: Self-pay | Admitting: Internal Medicine

## 2019-07-25 NOTE — Telephone Encounter (Signed)
Benjamin Valdez [phone]  JP called to say he has some bumps on his stump, they have been there for awhile. His brother suggested he call and see if you would call in an antibiotic. I let him know he would need office visit to look at them first. He said he has an appointment in [facility] on Friday and he would see if they would give him one.

## 2019-10-02 ENCOUNTER — Other Ambulatory Visit: Payer: Self-pay

## 2019-10-02 ENCOUNTER — Ambulatory Visit (INDEPENDENT_AMBULATORY_CARE_PROVIDER_SITE_OTHER): Payer: Medicare Other | Admitting: Dermatology

## 2019-10-02 DIAGNOSIS — D0359 Melanoma in situ of other part of trunk: Secondary | ICD-10-CM | POA: Diagnosis not present

## 2019-10-02 DIAGNOSIS — Z1283 Encounter for screening for malignant neoplasm of skin: Secondary | ICD-10-CM

## 2019-10-02 DIAGNOSIS — Z86018 Personal history of other benign neoplasm: Secondary | ICD-10-CM | POA: Diagnosis not present

## 2019-10-02 DIAGNOSIS — D485 Neoplasm of uncertain behavior of skin: Secondary | ICD-10-CM | POA: Diagnosis not present

## 2019-10-02 DIAGNOSIS — C439 Malignant melanoma of skin, unspecified: Secondary | ICD-10-CM

## 2019-10-02 HISTORY — DX: Malignant melanoma of skin, unspecified: C43.9

## 2019-10-02 NOTE — Progress Notes (Signed)
Follow-Up Visit   Subjective  Benjamin Valdez is a 59 y.o. male who presents for the following: Skin Problem (Check spot on back. Patient cant see it, but he can feel it. Not crusty just soft and raised. H/o atypical moles.).  Complete skin check Location:  Duration:  Quality:  Associated Signs/Symptoms: Modifying Factors:  Severity:  Timing: Context: History of multiple atypical moles  The following portions of the chart were reviewed this encounter and updated as appropriate: Tobacco  Allergies  Meds  Problems  Med Hx  Surg Hx  Fam Hx      Objective  Well appearing patient in no apparent distress; mood and affect are within normal limits.  A full examination was performed including scalp, head, eyes, ears, nose, lips, neck, chest, axillae, abdomen, back, buttocks, bilateral upper extremities, bilateral lower extremities, hands, feet, fingers, toes, fingernails, and toenails. All findings within normal limits unless otherwise noted below.   Assessment & Plan  Neoplasm of uncertain behavior of skin (2) Left Upper Back  Skin / nail biopsy Type of biopsy: tangential   Informed consent: discussed and consent obtained   Timeout: patient name, date of birth, surgical site, and procedure verified   Procedure prep:  Patient was prepped and draped in usual sterile fashion Prep type:  Chlorhexidine Anesthesia: the lesion was anesthetized in a standard fashion   Anesthetic:  1% lidocaine w/ epinephrine 1-100,000 local infiltration Instrument used: flexible razor blade   Hemostasis achieved with: ferric subsulfate   Outcome: patient tolerated procedure well   Post-procedure details: wound care instructions given    Specimen 1 - Surgical pathology Differential Diagnosis: ATYPIA Check Margins: No  Left Lower Back  Skin / nail biopsy Type of biopsy: tangential   Informed consent: discussed and consent obtained   Timeout: patient name, date of birth, surgical site,  and procedure verified   Procedure prep:  Patient was prepped and draped in usual sterile fashion Prep type:  Chlorhexidine Anesthesia: the lesion was anesthetized in a standard fashion   Anesthetic:  1% lidocaine w/ epinephrine 1-100,000 local infiltration Instrument used: flexible razor blade   Hemostasis achieved with: ferric subsulfate   Outcome: patient tolerated procedure well   Post-procedure details: wound care instructions given    Specimen 2 - Surgical pathology Differential Diagnosis: ATYPIA Check Margins: No  H/O dysplastic nevus Mid Back  Annual skin examination (has not been seen in over 3 years) First follow-up in several years for Benjamin Valdez date of birth January 27, 1961.  He gets his sarcoma follow-up done via [facility] and most recent imaging studies have shown no sign of recurrence.  He is having some difficulty with his left leg prosthesis and was recently told to try thin DuoDERM per small open area.  He has also lost mass in his left upper leg so he will likely be fitted for a new prosthesis in the future.  General skin examination showed no recurrent atypical mole.  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  Additionally, he has 1 bi-chromic nevus which dermoscopy showed some atypical features below his left scapula.  These 2 lesions were removed by shave biopsy and he could check on MyChart in 2 days or call my office to discuss the results.  Otherwise I do recommend that Benjamin Valdez be seen for general skin examination annually. Skin  cancer screening performed today.

## 2019-10-02 NOTE — Patient Instructions (Signed)

## 2019-10-06 ENCOUNTER — Telehealth: Payer: Self-pay | Admitting: Dermatology

## 2019-10-06 NOTE — Telephone Encounter (Signed)
Patient is calling for pathology results from last visit with Lavonna Monarch, MD.  Chart # [id number]

## 2019-10-10 ENCOUNTER — Encounter: Payer: Self-pay | Admitting: Dermatology

## 2019-10-25 DIAGNOSIS — F3341 Major depressive disorder, recurrent, in partial remission: Secondary | ICD-10-CM | POA: Diagnosis not present

## 2019-10-25 DIAGNOSIS — F411 Generalized anxiety disorder: Secondary | ICD-10-CM | POA: Diagnosis not present

## 2019-10-30 ENCOUNTER — Encounter: Payer: Self-pay | Admitting: Dermatology

## 2019-11-13 ENCOUNTER — Telehealth: Payer: Self-pay | Admitting: Internal Medicine

## 2019-11-13 NOTE — Telephone Encounter (Signed)
Faxed note back to [facility] that patient was going out of [place] for [place] and does not need this time.

## 2019-11-13 NOTE — Telephone Encounter (Signed)
LVM to CB and schedule appointment to discuss if he wants the [facility]

## 2019-11-13 NOTE — Telephone Encounter (Signed)
Patient called back and scheduled CPE for September, [place] first of September.

## 2019-12-19 ENCOUNTER — Other Ambulatory Visit: Payer: Self-pay | Admitting: Internal Medicine

## 2019-12-20 DIAGNOSIS — F331 Major depressive disorder, recurrent, moderate: Secondary | ICD-10-CM | POA: Diagnosis not present

## 2019-12-20 DIAGNOSIS — F411 Generalized anxiety disorder: Secondary | ICD-10-CM | POA: Diagnosis not present

## 2019-12-20 DIAGNOSIS — F4322 Adjustment disorder with anxiety: Secondary | ICD-10-CM | POA: Diagnosis not present

## 2019-12-28 ENCOUNTER — Encounter: Payer: Self-pay | Admitting: Dermatology

## 2019-12-28 ENCOUNTER — Ambulatory Visit (INDEPENDENT_AMBULATORY_CARE_PROVIDER_SITE_OTHER): Payer: Medicare HMO | Admitting: Dermatology

## 2019-12-28 ENCOUNTER — Other Ambulatory Visit: Payer: Self-pay

## 2019-12-28 DIAGNOSIS — D0359 Melanoma in situ of other part of trunk: Secondary | ICD-10-CM

## 2019-12-28 DIAGNOSIS — D225 Melanocytic nevi of trunk: Secondary | ICD-10-CM | POA: Diagnosis not present

## 2019-12-28 DIAGNOSIS — F4322 Adjustment disorder with anxiety: Secondary | ICD-10-CM | POA: Diagnosis not present

## 2019-12-28 DIAGNOSIS — F331 Major depressive disorder, recurrent, moderate: Secondary | ICD-10-CM | POA: Diagnosis not present

## 2019-12-28 DIAGNOSIS — F411 Generalized anxiety disorder: Secondary | ICD-10-CM | POA: Diagnosis not present

## 2019-12-28 NOTE — Progress Notes (Signed)
Left lower back Excision 2.5 cm 3-0 vicryl x 7 3-0 ethilon x 9 plus 3 vicryl on outside Possible steri strips  Superior margin stained

## 2019-12-28 NOTE — Patient Instructions (Signed)

## 2019-12-29 ENCOUNTER — Other Ambulatory Visit: Payer: Medicare HMO | Admitting: Internal Medicine

## 2019-12-29 DIAGNOSIS — G43A0 Cyclical vomiting, not intractable: Secondary | ICD-10-CM | POA: Diagnosis not present

## 2019-12-29 DIAGNOSIS — Z0000 Encounter for general adult medical examination without abnormal findings: Secondary | ICD-10-CM | POA: Diagnosis not present

## 2019-12-29 DIAGNOSIS — F411 Generalized anxiety disorder: Secondary | ICD-10-CM | POA: Diagnosis not present

## 2019-12-29 DIAGNOSIS — E7800 Pure hypercholesterolemia, unspecified: Secondary | ICD-10-CM

## 2019-12-29 DIAGNOSIS — C419 Malignant neoplasm of bone and articular cartilage, unspecified: Secondary | ICD-10-CM | POA: Diagnosis not present

## 2019-12-29 DIAGNOSIS — Z125 Encounter for screening for malignant neoplasm of prostate: Secondary | ICD-10-CM | POA: Diagnosis not present

## 2019-12-29 DIAGNOSIS — R7302 Impaired glucose tolerance (oral): Secondary | ICD-10-CM | POA: Diagnosis not present

## 2019-12-29 DIAGNOSIS — R1115 Cyclical vomiting syndrome unrelated to migraine: Secondary | ICD-10-CM | POA: Diagnosis not present

## 2019-12-29 DIAGNOSIS — S88112A Complete traumatic amputation at level between knee and ankle, left lower leg, initial encounter: Secondary | ICD-10-CM | POA: Diagnosis not present

## 2019-12-29 DIAGNOSIS — F4321 Adjustment disorder with depressed mood: Secondary | ICD-10-CM | POA: Diagnosis not present

## 2019-12-30 LAB — PSA: PSA: 0.9 ng/mL (ref <=4.0)

## 2019-12-30 LAB — COMPLETE METABOLIC PANEL WITH GFR
AG Ratio: 2 (calc) (ref 1.0–2.5)
ALT: 19 U/L (ref 9–46)
AST: 20 U/L (ref 10–35)
Albumin: 4.2 g/dL (ref 3.6–5.1)
Alkaline phosphatase (APISO): 60 U/L (ref 35–144)
BUN/Creatinine Ratio: 14 (calc) (ref 6–22)
BUN: 19 mg/dL (ref 7–25)
CO2: 25 mmol/L (ref 20–32)
Calcium: 9.1 mg/dL (ref 8.6–10.3)
Chloride: 104 mmol/L (ref 98–110)
Creat: 1.35 mg/dL — ABNORMAL HIGH (ref 0.70–1.33)
GFR, Est African American: 67 mL/min/{1.73_m2} (ref >=60)
GFR, Est Non African American: 57 mL/min/{1.73_m2} — ABNORMAL LOW (ref >=60)
Globulin: 2.1 g/dL (calc) (ref 1.9–3.7)
Glucose, Bld: 110 mg/dL — ABNORMAL HIGH (ref 65–99)
Potassium: 4.7 mmol/L (ref 3.5–5.3)
Sodium: 139 mmol/L (ref 135–146)
Total Bilirubin: 0.5 mg/dL (ref 0.2–1.2)
Total Protein: 6.3 g/dL (ref 6.1–8.1)

## 2019-12-30 LAB — HEMOGLOBIN A1C
Hgb A1c MFr Bld: 5.5 %{Hb} (ref <5.7)
Mean Plasma Glucose: 111 (calc)
eAG (mmol/L): 6.2 (calc)

## 2019-12-30 LAB — LIPID PANEL
Cholesterol: 158 mg/dL (ref <200)
HDL: 53 mg/dL (ref >=40)
LDL Cholesterol (Calc): 90 mg/dL
Non-HDL Cholesterol (Calc): 105 mg/dL (ref <130)
Total CHOL/HDL Ratio: 3 (calc) (ref <5.0)
Triglycerides: 67 mg/dL (ref <150)

## 2019-12-30 LAB — CBC WITH DIFFERENTIAL/PLATELET
Absolute Monocytes: 238 {cells}/uL (ref 200–950)
Basophils Absolute: 31 {cells}/uL (ref 0–200)
Basophils Relative: 0.9 %
Eosinophils Absolute: 109 {cells}/uL (ref 15–500)
Eosinophils Relative: 3.2 %
HCT: 43.4 % (ref 38.5–50.0)
Hemoglobin: 14.2 g/dL (ref 13.2–17.1)
Lymphs Abs: 792 {cells}/uL — ABNORMAL LOW (ref 850–3900)
MCH: 30.5 pg (ref 27.0–33.0)
MCHC: 32.7 g/dL (ref 32.0–36.0)
MCV: 93.1 fL (ref 80.0–100.0)
MPV: 11.1 fL (ref 7.5–12.5)
Monocytes Relative: 7 %
Neutro Abs: 2230 {cells}/uL (ref 1500–7800)
Neutrophils Relative %: 65.6 %
Platelets: 223 Thousand/uL (ref 140–400)
RBC: 4.66 Million/uL (ref 4.20–5.80)
RDW: 13 % (ref 11.0–15.0)
Total Lymphocyte: 23.3 %
WBC: 3.4 Thousand/uL — ABNORMAL LOW (ref 3.8–10.8)

## 2020-01-01 ENCOUNTER — Ambulatory Visit (INDEPENDENT_AMBULATORY_CARE_PROVIDER_SITE_OTHER): Payer: Medicare HMO | Admitting: Internal Medicine

## 2020-01-01 ENCOUNTER — Other Ambulatory Visit: Payer: Self-pay

## 2020-01-01 ENCOUNTER — Encounter: Payer: Self-pay | Admitting: Internal Medicine

## 2020-01-01 VITALS — BP 120/90 | HR 84 | Wt 150.0 lb

## 2020-01-01 DIAGNOSIS — E7800 Pure hypercholesterolemia, unspecified: Secondary | ICD-10-CM

## 2020-01-01 DIAGNOSIS — F411 Generalized anxiety disorder: Secondary | ICD-10-CM | POA: Diagnosis not present

## 2020-01-01 DIAGNOSIS — Z0000 Encounter for general adult medical examination without abnormal findings: Secondary | ICD-10-CM

## 2020-01-01 DIAGNOSIS — Z89512 Acquired absence of left leg below knee: Secondary | ICD-10-CM | POA: Diagnosis not present

## 2020-01-01 DIAGNOSIS — Z87898 Personal history of other specified conditions: Secondary | ICD-10-CM

## 2020-01-01 DIAGNOSIS — R1115 Cyclical vomiting syndrome unrelated to migraine: Secondary | ICD-10-CM | POA: Diagnosis not present

## 2020-01-01 DIAGNOSIS — Z23 Encounter for immunization: Secondary | ICD-10-CM

## 2020-01-01 DIAGNOSIS — D0359 Melanoma in situ of other part of trunk: Secondary | ICD-10-CM | POA: Diagnosis not present

## 2020-01-01 LAB — POCT URINALYSIS DIPSTICK
Appearance: NEGATIVE
Bilirubin, UA: NEGATIVE
Blood, UA: NEGATIVE
Glucose, UA: NEGATIVE
Ketones, UA: NEGATIVE
Leukocytes, UA: NEGATIVE
Nitrite, UA: NEGATIVE
Odor: NEGATIVE
Protein, UA: NEGATIVE
Spec Grav, UA: 1.015 (ref 1.010–1.025)
Urobilinogen, UA: 0.2 E.U./dL
pH, UA: 6.5 (ref 5.0–8.0)

## 2020-01-01 NOTE — Progress Notes (Signed)
Subjective:    Patient ID: Benjamin Valdez, male    DOB: [birth year], 59 y.o.   MRN: [id number]  HPI  Recently had melanoma in situ arising within  dysplastic nevus.  He saw Dr. Denna Haggard and had excision of the lesion.  He is here today for health maintenance exam, Medicare wellness exam, exam and evaluation of medical issues.  He was last here for health maintenance exam in August 2020.  Since then his mother passed away rather suddenly as did his father.  Neither were in good health.  Mother had history of coronary disease and congestive heart failure.  Father also had congestive heart failure and had an acute event and passed away at home.  Mother passed away in the hospital.  He was living with his parents and was the primary caregiver.  Since then, their home has been sold and he has had the following new living quarters.  He had thought he might be able to go to [place] to visit relatives for a while but this did not work out during the pandemic.  Family also has a beach home but it is not possible for him to live there full-time apparently.  He likes to go to the gym and workout.  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  He has a leg prosthesis.  He is seen annually at [facility] regarding history of angiosarcoma of the left lower extremity.  He has had no history of recurrence.  He has history of cyclical vomiting and has to go to the emergency department sometimes to get antinausea medication and IV fluids.  He is on chronic Elavil therapy.  He does not have migraine headaches associated with the cyclical vomiting.  No known drug allergies  Remote history of fractured clavicle  Social history: Never married.  Does not smoke.  No alcohol consumption.  4-year college degree.  Family history: 1 brother with history of Crohn's disease.  Mother had osteoporosis, hyperlipidemia, atrial fibrillation, history of MI, mitral regurgitation and stent  placement along with diabetes mellitus and was in her 4s when she passed away.  Father was in his late 47s with history of chronic back pain from lumbar stenosis, bladder cancer, diabetes mellitus, coronary stent placement, hyperlipidemia, hypertension, COPD and obstructive sleep apnea.  1 sister age 72 in good health.  Sister's son has history of drug addiction.  Past medical history: He has a history of depression treated with SSRI and he also has a therapist.  He takes PPI for GE reflux.  He has a history of hyperlipidemia treated with Crestor.  History of mild glucose intolerance with last hemoglobin A1c 6.1% in February 2021 and is now excellent at 5.5%.  His lipid panel is now normal.  Fasting glucose is 110.  His creatinine was elevated at 1.25 with his fasting labs.  We repeated creatinine today and it is excellent at 1.15.  It was likely elevated due to fasting state overnight.  History of MRI of the left shoulder 2012 showing a probable labral tear.  Normal nuclear stress test in 2009.    Review of Systems colonoscopy 2014 by Dr. Watt Climes.  2 polyps were removed.  Apparently these were benign and he was asked to come back in January 2024.  Aside from mild depression and visiting his parents no new complaints.     Objective:   Physical Exam Blood pressure 120/90 pulse 84 pulse oximetry 96% weight  150 pounds BMI 20.44  Skin warm and dry.  No cervical adenopathy.  TMs are clear.  Neck is supple without thyromegaly or carotid bruits.  Chest is clear to auscultation without rales or wheezing.  Cardiac exam regular rate and rhythm normal S1 and S2.  Abdomen soft nondistended without hepatosplenomegaly masses or tenderness.  Prostate is normal without nodules.  No right lower extremity edema.  Has prosthesis left leg.  Neuro: Intact without focal deficit.  Affect felt judgment appear to be normal.       Assessment & Plan:  History of cyclical vomiting but no recent episodes  Status post  left BKA for angiosarcoma in 1998 currently with prosthesis  Anxiety and depression over situational stress/grief with both parents passing away in addition to patient having to move to an unfamiliar environment.  History of hyperlipidemia treated with statin and stable with Crestor 20 mg daily  Anxiety depression treated with Paxil, Elavil.  Has also received Abilify from psychiatry but now is on Remeron.  History of cyclical vomiting treated with Valium and Zofran as needed.  History of impaired glucose tolerance but recent hemoglobin A1c normal  Recent removal of melanoma in situ left lower back by Dr. Denna Haggard  Plan:RTC in 6 months  Subjective:   Patient presents for Medicare Annual/Subsequent preventive examination.  Review Past Medical/Family/Social: See above   Risk Factors  Current exercise habits: Exercises at gym Dietary issues discussed: Low-fat low carbohydrate  Cardiac risk factors: Hyperlipidemia, family history in mother  Depression Screen  (Note: if answer to either of the following is "Yes", a more complete depression screening is indicated)   Over the past two weeks, have you felt down, depressed or hopeless?  Just a bit due to pandemic and not being able to go to [place].  Loss of 2 parents in a short period of time. Over the past two weeks, have you felt little interest or pleasure in doing things?  Grief reaction due to loss of 2 parents in a short period of time Have you lost interest or pleasure in daily life? No Do you often feel hopeless? No Do you cry easily over simple problems? No   Activities of Daily Living  In your present state of health, do you have any difficulty performing the following activities?:   Driving? No  Managing money? No  Feeding yourself? No  Getting from bed to chair? No  Climbing a flight of stairs? No  Preparing food and eating?: No  Bathing or showering? No  Getting dressed: No  Getting to the toilet? No  Using the  toilet:No  Moving around from place to place: No  In the past year have you fallen or had a near fall?:No  Are you sexually active? No  Do you have more than one partner? No   Hearing Difficulties: No  Do you often ask people to speak up or repeat themselves? No  Do you experience ringing or noises in your ears? No  Do you have difficulty understanding soft or whispered voices? No  Do you feel that you have a problem with memory? No Do you often misplace items? No    Home Safety:  Do you have a smoke alarm at your residence? Yes Do you have grab bars in the bathroom?  Yes Do you have throw rugs in your house?  no   Cognitive Testing  Alert? Yes Normal Appearance?Yes  Oriented to person? Yes Place? Yes  Time? Yes  Recall of three  objects? Yes  Can perform simple calculations? Yes  Displays appropriate judgment?Yes  Can read the correct time from a watch face?Yes   List the Names of Other Physician/Practitioners you currently use:  See referral list for the physicians patient is currently seeing.     Review of Systems: See above   Objective:     General appearance: Appears stated age and fit Head: Normocephalic, without obvious abnormality, atraumatic  Eyes: conj clear, EOMi PEERLA  Ears: normal TM's and external ear canals both ears  Nose: Nares normal. Septum midline. Mucosa normal. No drainage or sinus tenderness.  Throat: lips, mucosa, and tongue normal; teeth and gums normal  Neck: no adenopathy, no carotid bruit, no JVD, supple, symmetrical, trachea midline and thyroid not enlarged, symmetric, no tenderness/mass/nodules  No CVA tenderness.  Lungs: clear to auscultation bilaterally  Breasts: normal appearance Heart: regular rate and rhythm, S1, S2 normal, no murmur, click, rub or gallop  Abdomen: soft, non-tender; bowel sounds normal; no masses, no organomegaly  Musculoskeletal: ROM normal in all joints, no crepitus, no deformity, Normal muscle strengthen. Back   is symmetric, no curvature. Skin: Skin color, texture, turgor normal. No rashes or lesions  Lymph nodes: Cervical, supraclavicular, and axillary nodes normal.  Neurologic: CN 2 -12 Normal, Normal symmetric reflexes. Normal coordination and gait  Psych: Alert & Oriented x 3, Mood appear stable.    Assessment:    Annual wellness medicare exam   Plan:    During the course of the visit the patient was educated and counseled about appropriate screening and preventive services including:   Have Covid booster when available.  Flu vaccine given.  Tetanus immunization is up-to-date.     Patient Instructions (the written plan) was given to the patient.  Medicare Attestation  I have personally reviewed:  The patient's medical and social history  Their use of alcohol, tobacco or illicit drugs  Their current medications and supplements  The patient's functional ability including ADLs,fall risks, home safety risks, cognitive, and hearing and visual impairment  Diet and physical activities  Evidence for depression or mood disorders  The patient's weight, height, BMI, and visual acuity have been recorded in the chart. I have made referrals, counseling, and provided education to the patient based on review of the above and I have provided the patient with a written personalized care plan for preventive services.

## 2020-01-02 ENCOUNTER — Telehealth: Payer: Self-pay

## 2020-01-02 NOTE — Telephone Encounter (Signed)
Left voicemail, needs to come well hydrated next week for a creatinine recheck.

## 2020-01-05 DIAGNOSIS — F3341 Major depressive disorder, recurrent, in partial remission: Secondary | ICD-10-CM | POA: Diagnosis not present

## 2020-01-05 DIAGNOSIS — F411 Generalized anxiety disorder: Secondary | ICD-10-CM | POA: Diagnosis not present

## 2020-01-05 DIAGNOSIS — F4322 Adjustment disorder with anxiety: Secondary | ICD-10-CM | POA: Diagnosis not present

## 2020-01-11 ENCOUNTER — Ambulatory Visit (INDEPENDENT_AMBULATORY_CARE_PROVIDER_SITE_OTHER): Payer: Medicare HMO

## 2020-01-11 ENCOUNTER — Other Ambulatory Visit: Payer: Medicare HMO | Admitting: Internal Medicine

## 2020-01-11 ENCOUNTER — Other Ambulatory Visit: Payer: Self-pay

## 2020-01-11 DIAGNOSIS — R7989 Other specified abnormal findings of blood chemistry: Secondary | ICD-10-CM

## 2020-01-11 DIAGNOSIS — Z4802 Encounter for removal of sutures: Secondary | ICD-10-CM

## 2020-01-11 DIAGNOSIS — F331 Major depressive disorder, recurrent, moderate: Secondary | ICD-10-CM | POA: Diagnosis not present

## 2020-01-11 DIAGNOSIS — F411 Generalized anxiety disorder: Secondary | ICD-10-CM | POA: Diagnosis not present

## 2020-01-11 DIAGNOSIS — F4322 Adjustment disorder with anxiety: Secondary | ICD-10-CM | POA: Diagnosis not present

## 2020-01-11 LAB — BASIC METABOLIC PANEL
BUN: 21 mg/dL (ref 7–25)
CO2: 28 mmol/L (ref 20–32)
Calcium: 9.3 mg/dL (ref 8.6–10.3)
Chloride: 103 mmol/L (ref 98–110)
Creat: 1.15 mg/dL (ref 0.70–1.33)
Glucose, Bld: 119 mg/dL — ABNORMAL HIGH (ref 65–99)
Potassium: 4.5 mmol/L (ref 3.5–5.3)
Sodium: 140 mmol/L (ref 135–146)

## 2020-01-11 NOTE — Progress Notes (Signed)
SUTURE REMOVAL PHOTO TAKEN PATIENT ADVISED TO CALL Monday ON [facility], PER ST NO CULTURE NEEDED.

## 2020-01-14 NOTE — Patient Instructions (Addendum)
He will be following up with Dr. Denna Haggard regarding melanoma on his left back.  He will continue with current medications.  Flu vaccine given.  Has follow-up appointment in March which will be his 48-month recheck.

## 2020-01-18 DIAGNOSIS — F411 Generalized anxiety disorder: Secondary | ICD-10-CM | POA: Diagnosis not present

## 2020-01-18 DIAGNOSIS — F331 Major depressive disorder, recurrent, moderate: Secondary | ICD-10-CM | POA: Diagnosis not present

## 2020-01-18 DIAGNOSIS — F4322 Adjustment disorder with anxiety: Secondary | ICD-10-CM | POA: Diagnosis not present

## 2020-01-26 ENCOUNTER — Encounter: Payer: Self-pay | Admitting: Dermatology

## 2020-01-26 NOTE — Progress Notes (Signed)
   Follow-Up Visit   Subjective  Benjamin Valdez is a 58 y.o. male who presents for the following: Procedure (MM left lower back).  Melanoma in situ Location: Left lower back Duration:  Quality:  Associated Signs/Symptoms: Modifying Factors:  Severity:  Timing: Context: For treatment  Objective  Well appearing patient in no apparent distress; mood and affect are within normal limits.  All skin waist up examined.   Assessment & Plan    Melanoma in situ of back ([place]) Left Lower Back  Skin excision  Lesion length (cm):  0.5 Lesion width (cm):  0.5 Margin per side (cm):  1 Total excision diameter (cm):  2.5 Informed consent: discussed and consent obtained   Timeout: patient name, date of birth, surgical site, and procedure verified   Procedure prep:  Patient was prepped and draped in usual sterile fashion Prep type:  Chlorhexidine Anesthesia: the lesion was anesthetized in a standard fashion   Anesthetic:  1% lidocaine w/ epinephrine 1-100,000 local infiltration Instrument used: #15 blade   Hemostasis achieved with: suture   Outcome: patient tolerated procedure well with no complications   Post-procedure details: sterile dressing applied and wound care instructions given   Dressing type: petrolatum    Skin repair Complexity:  Intermediate Final length (cm):  7.2 Informed consent: discussed and consent obtained   Timeout: patient name, date of birth, surgical site, and procedure verified   Procedure prep:  Patient was prepped and draped in usual sterile fashion Prep type:  Chlorhexidine Anesthesia: the lesion was anesthetized in a standard fashion   Reason for type of repair: reduce tension to allow closure, reduce the risk of dehiscence, infection, and necrosis, reduce subcutaneous dead space and avoid a hematoma and allow closure of the large defect   Undermining: edges could be approximated without difficulty and edges undermined   Subcutaneous layers (deep  stitches):  Suture size:  3-0 Suture type: Vicryl (polyglactin 910)   Stitches:  Buried vertical mattress Fine/surface layer approximation (top stitches):  Suture size:  3-0 Suture type: nylon   Suture removal (days):  14 Hemostasis achieved with: suture Outcome: patient tolerated procedure well with no complications   Post-procedure details: sterile dressing applied   Dressing type: petrolatum    Specimen 1 - Surgical pathology Differential Diagnosis: mm in situ Check Margins: No Excision 2.5 cm 3-0 vicryl x 7 3-0 ethilon x 9 plus 3 vicryl on outside Possible steri strips  Superior margin stained      I, Lavonna Monarch, MD, have reviewed all documentation for this visit.  The documentation on 01/26/20 for the exam, diagnosis, procedures, and orders are all accurate and complete.

## 2020-01-29 DIAGNOSIS — F331 Major depressive disorder, recurrent, moderate: Secondary | ICD-10-CM | POA: Diagnosis not present

## 2020-01-29 DIAGNOSIS — F411 Generalized anxiety disorder: Secondary | ICD-10-CM | POA: Diagnosis not present

## 2020-02-13 DIAGNOSIS — F411 Generalized anxiety disorder: Secondary | ICD-10-CM | POA: Diagnosis not present

## 2020-02-13 DIAGNOSIS — Z89512 Acquired absence of left leg below knee: Secondary | ICD-10-CM | POA: Diagnosis not present

## 2020-02-13 DIAGNOSIS — F331 Major depressive disorder, recurrent, moderate: Secondary | ICD-10-CM | POA: Diagnosis not present

## 2020-02-26 DIAGNOSIS — F331 Major depressive disorder, recurrent, moderate: Secondary | ICD-10-CM | POA: Diagnosis not present

## 2020-02-26 DIAGNOSIS — F4322 Adjustment disorder with anxiety: Secondary | ICD-10-CM | POA: Diagnosis not present

## 2020-02-26 DIAGNOSIS — F411 Generalized anxiety disorder: Secondary | ICD-10-CM | POA: Diagnosis not present

## 2020-02-29 ENCOUNTER — Other Ambulatory Visit: Payer: Self-pay | Admitting: Internal Medicine

## 2020-03-05 DIAGNOSIS — S61218A Laceration without foreign body of other finger without damage to nail, initial encounter: Secondary | ICD-10-CM | POA: Diagnosis not present

## 2020-03-05 DIAGNOSIS — F411 Generalized anxiety disorder: Secondary | ICD-10-CM | POA: Diagnosis not present

## 2020-03-05 DIAGNOSIS — F331 Major depressive disorder, recurrent, moderate: Secondary | ICD-10-CM | POA: Diagnosis not present

## 2020-03-05 DIAGNOSIS — M79645 Pain in left finger(s): Secondary | ICD-10-CM | POA: Diagnosis not present

## 2020-03-05 DIAGNOSIS — Z6821 Body mass index (BMI) 21.0-21.9, adult: Secondary | ICD-10-CM | POA: Diagnosis not present

## 2020-03-20 DIAGNOSIS — F411 Generalized anxiety disorder: Secondary | ICD-10-CM | POA: Diagnosis not present

## 2020-03-20 DIAGNOSIS — F331 Major depressive disorder, recurrent, moderate: Secondary | ICD-10-CM | POA: Diagnosis not present

## 2020-04-01 DIAGNOSIS — F411 Generalized anxiety disorder: Secondary | ICD-10-CM | POA: Diagnosis not present

## 2020-04-01 DIAGNOSIS — F331 Major depressive disorder, recurrent, moderate: Secondary | ICD-10-CM | POA: Diagnosis not present

## 2020-04-16 ENCOUNTER — Encounter: Payer: Self-pay | Admitting: Dermatology

## 2020-04-16 ENCOUNTER — Other Ambulatory Visit: Payer: Self-pay

## 2020-04-16 ENCOUNTER — Ambulatory Visit (INDEPENDENT_AMBULATORY_CARE_PROVIDER_SITE_OTHER): Payer: Medicare HMO | Admitting: Dermatology

## 2020-04-16 DIAGNOSIS — Z86018 Personal history of other benign neoplasm: Secondary | ICD-10-CM

## 2020-04-16 DIAGNOSIS — Z86006 Personal history of melanoma in-situ: Secondary | ICD-10-CM

## 2020-04-16 DIAGNOSIS — F3341 Major depressive disorder, recurrent, in partial remission: Secondary | ICD-10-CM | POA: Diagnosis not present

## 2020-04-16 DIAGNOSIS — Z1283 Encounter for screening for malignant neoplasm of skin: Secondary | ICD-10-CM

## 2020-04-16 DIAGNOSIS — F411 Generalized anxiety disorder: Secondary | ICD-10-CM | POA: Diagnosis not present

## 2020-04-20 ENCOUNTER — Encounter: Payer: Self-pay | Admitting: Dermatology

## 2020-04-20 NOTE — Progress Notes (Signed)
   Follow-Up Visit   Subjective  Benjamin Valdez is a 60 y.o. male who presents for the following: Follow-up (Patient here today for 3 month follow up from treatment of MIS. No new concerns today).  General skin examination Location:  Duration:  Quality:  Associated Signs/Symptoms: Modifying Factors:  Severity:  Timing: Context: History of melanoma  Objective  Well appearing patient in no apparent distress; mood and affect are within normal limits. Objective  Left Lower Back: No sign residual.  No regional nodes.  Objective  Mid Back, Right Upper Back: Multiple atypical moles; no sign recurrence.  Objective  Chest - Medial [facility]): Complete skin examination, all moles checked with dermoscopy, no current atypical lesions    A full examination was performed including scalp, head, eyes, ears, nose, lips, neck, chest, axillae, abdomen, back, buttocks, bilateral upper extremities, bilateral lower extremities, hands, feet, fingers, toes, fingernails, and toenails. All findings within normal limits unless otherwise noted below.   Assessment & Plan    History of melanoma in situ Left Lower Back  Because of this melanoma plus strong history of atypical moles, I will plan on seeing [name] at 31-month intervals for the next 3 years.  History of atypical skin mole (2) Right Upper Back; Mid Back  Recheck every 6 months  Skin exam for malignant neoplasm Chest - Medial [facility])      I, Benjamin Harder, MD, have reviewed all documentation for this visit.  The documentation on 04/20/20 for the exam, diagnosis, procedures, and orders are all accurate and complete.

## 2020-05-07 DIAGNOSIS — F3341 Major depressive disorder, recurrent, in partial remission: Secondary | ICD-10-CM | POA: Diagnosis not present

## 2020-05-07 DIAGNOSIS — F411 Generalized anxiety disorder: Secondary | ICD-10-CM | POA: Diagnosis not present

## 2020-05-16 DIAGNOSIS — Z89512 Acquired absence of left leg below knee: Secondary | ICD-10-CM | POA: Diagnosis not present

## 2020-06-06 DIAGNOSIS — F331 Major depressive disorder, recurrent, moderate: Secondary | ICD-10-CM | POA: Diagnosis not present

## 2020-06-06 DIAGNOSIS — F411 Generalized anxiety disorder: Secondary | ICD-10-CM | POA: Diagnosis not present

## 2020-06-28 ENCOUNTER — Other Ambulatory Visit: Payer: Medicare HMO | Admitting: Internal Medicine

## 2020-06-28 ENCOUNTER — Other Ambulatory Visit: Payer: Self-pay

## 2020-06-28 DIAGNOSIS — R7302 Impaired glucose tolerance (oral): Secondary | ICD-10-CM | POA: Diagnosis not present

## 2020-06-28 DIAGNOSIS — E7800 Pure hypercholesterolemia, unspecified: Secondary | ICD-10-CM

## 2020-06-29 LAB — LIPID PANEL
Cholesterol: 147 mg/dL (ref <200)
HDL: 53 mg/dL (ref >=40)
LDL Cholesterol (Calc): 79 mg/dL (calc)
Non-HDL Cholesterol (Calc): 94 mg/dL (calc) (ref <130)
Total CHOL/HDL Ratio: 2.8 (calc) (ref <5.0)
Triglycerides: 66 mg/dL (ref <150)

## 2020-06-29 LAB — HEPATIC FUNCTION PANEL
AG Ratio: 2.3 (calc) (ref 1.0–2.5)
ALT: 18 U/L (ref 9–46)
AST: 24 U/L (ref 10–35)
Albumin: 4.5 g/dL (ref 3.6–5.1)
Alkaline phosphatase (APISO): 68 U/L (ref 35–144)
Bilirubin, Direct: 0.1 mg/dL (ref 0.0–0.2)
Globulin: 2 g/dL (calc) (ref 1.9–3.7)
Indirect Bilirubin: 0.3 mg/dL (calc) (ref 0.2–1.2)
Total Bilirubin: 0.4 mg/dL (ref 0.2–1.2)
Total Protein: 6.5 g/dL (ref 6.1–8.1)

## 2020-06-29 LAB — HEMOGLOBIN A1C
Hgb A1c MFr Bld: 5.7 % of total Hgb — ABNORMAL HIGH (ref <5.7)
Mean Plasma Glucose: 117 mg/dL
eAG (mmol/L): 6.5 mmol/L

## 2020-07-01 ENCOUNTER — Ambulatory Visit: Payer: Medicare HMO | Admitting: Internal Medicine

## 2020-07-04 ENCOUNTER — Encounter: Payer: Self-pay | Admitting: Internal Medicine

## 2020-07-04 ENCOUNTER — Other Ambulatory Visit: Payer: Self-pay

## 2020-07-04 ENCOUNTER — Ambulatory Visit (INDEPENDENT_AMBULATORY_CARE_PROVIDER_SITE_OTHER): Payer: Medicare HMO | Admitting: Internal Medicine

## 2020-07-04 VITALS — Ht 72.0 in | Wt 164.0 lb

## 2020-07-04 DIAGNOSIS — Z87898 Personal history of other specified conditions: Secondary | ICD-10-CM

## 2020-07-04 DIAGNOSIS — F32A Depression, unspecified: Secondary | ICD-10-CM

## 2020-07-04 DIAGNOSIS — F419 Anxiety disorder, unspecified: Secondary | ICD-10-CM

## 2020-07-04 DIAGNOSIS — F331 Major depressive disorder, recurrent, moderate: Secondary | ICD-10-CM | POA: Diagnosis not present

## 2020-07-04 DIAGNOSIS — F411 Generalized anxiety disorder: Secondary | ICD-10-CM | POA: Diagnosis not present

## 2020-07-04 DIAGNOSIS — F439 Reaction to severe stress, unspecified: Secondary | ICD-10-CM | POA: Diagnosis not present

## 2020-07-04 DIAGNOSIS — Z89512 Acquired absence of left leg below knee: Secondary | ICD-10-CM | POA: Diagnosis not present

## 2020-07-04 DIAGNOSIS — E7800 Pure hypercholesterolemia, unspecified: Secondary | ICD-10-CM | POA: Diagnosis not present

## 2020-07-04 MED ORDER — MUPIROCIN 2 % EX OINT: 1.0000 "application " | TOPICAL_OINTMENT | Freq: Two times a day (BID) | CUTANEOUS | 0 refills | Status: DC

## 2020-07-04 NOTE — Progress Notes (Signed)
   Subjective:    Patient ID: Benjamin Valdez, male    DOB: 01-05-1961, 60 y.o.   MRN: [id number]  HPI  60 year old Male for health maintenance exam and evaluation of medical issues.  His parents passed away at separate times a few months ago.  Mother expired in the hospital and father expired at home.  He was living with his parents and was the primary caregiver.  Since then, there home is been sold.  He has a rental home with a new roommate.  This may or may not work out.  He continues with counseling with Eino Farber for anxiety and depression.  He has a history of cyclical vomiting treated with antinausea medications and IV fluids intermittently at the emergency department.  He is on chronic Elavil therapy.  Does have Valium on hand to take if needed every 8 hours sparingly.  Also has been prescribed Remeron 15 mg daily.  History of left BKA due to angiosarcoma of the left leg in 1998.  He receives disability benefits.  History of hyperlipidemia treated with Crestor 20 mg daily.  History of diet-controlled impaired glucose tolerance  Review of Systems see above-we discussed his new roommate and issues of adjustment to new roommate.     Objective:   Physical Exam Weight 164 pounds height 6 feet he is in no acute distress.  His affect thought and judgment appear to be normal.  He does not appear to be significantly depressed but mildly disturbed as to whether or not situation will work out with his roommate.  He is known roommate for many years.       Assessment & Plan:  Longstanding history of anxiety and depression.  Currently taking Elavil 50 mg daily, has Valium on hand to take for cyclical vomiting if needed.  Also taking Remeron 15 mg.  He is on Crestor for hyperlipidemia.  Lipid panel is entirely normal.  Liver functions are normal.  He has mild glucose intolerance and hemoglobin A1c are stable on diet alone at 5.7%.  He does get regular exercise.  Plan: He will return in 6 months  for health maintenance exam and Medicare wellness visit.  He will continue with counselor and current medications for anxiety and depression.  Glucose intolerance is treated with diet alone.  He is on Crestor for hyperlipidemia and lipid panel is normal.

## 2020-07-04 NOTE — Patient Instructions (Signed)
Hgb AIC and lipids under excellent control. It was a pleasure to see you today. Return for CPE in 6 months.

## 2020-07-18 DIAGNOSIS — F3341 Major depressive disorder, recurrent, in partial remission: Secondary | ICD-10-CM | POA: Diagnosis not present

## 2020-07-18 DIAGNOSIS — F411 Generalized anxiety disorder: Secondary | ICD-10-CM | POA: Diagnosis not present

## 2020-07-29 DIAGNOSIS — F3341 Major depressive disorder, recurrent, in partial remission: Secondary | ICD-10-CM | POA: Diagnosis not present

## 2020-07-29 DIAGNOSIS — F411 Generalized anxiety disorder: Secondary | ICD-10-CM | POA: Diagnosis not present

## 2020-09-02 DIAGNOSIS — F411 Generalized anxiety disorder: Secondary | ICD-10-CM | POA: Diagnosis not present

## 2020-09-02 DIAGNOSIS — F3341 Major depressive disorder, recurrent, in partial remission: Secondary | ICD-10-CM | POA: Diagnosis not present

## 2020-09-18 DIAGNOSIS — Z89512 Acquired absence of left leg below knee: Secondary | ICD-10-CM | POA: Diagnosis not present

## 2020-10-02 DIAGNOSIS — F3341 Major depressive disorder, recurrent, in partial remission: Secondary | ICD-10-CM | POA: Diagnosis not present

## 2020-10-02 DIAGNOSIS — F411 Generalized anxiety disorder: Secondary | ICD-10-CM | POA: Diagnosis not present

## 2020-10-15 ENCOUNTER — Other Ambulatory Visit: Payer: Self-pay

## 2020-10-15 ENCOUNTER — Ambulatory Visit (INDEPENDENT_AMBULATORY_CARE_PROVIDER_SITE_OTHER): Payer: Medicare HMO | Admitting: Dermatology

## 2020-10-15 ENCOUNTER — Encounter: Payer: Self-pay | Admitting: Dermatology

## 2020-10-15 DIAGNOSIS — D485 Neoplasm of uncertain behavior of skin: Secondary | ICD-10-CM

## 2020-10-15 DIAGNOSIS — Z86018 Personal history of other benign neoplasm: Secondary | ICD-10-CM

## 2020-10-15 DIAGNOSIS — Z85820 Personal history of malignant melanoma of skin: Secondary | ICD-10-CM

## 2020-10-29 DIAGNOSIS — F331 Major depressive disorder, recurrent, moderate: Secondary | ICD-10-CM | POA: Diagnosis not present

## 2020-10-29 DIAGNOSIS — F411 Generalized anxiety disorder: Secondary | ICD-10-CM | POA: Diagnosis not present

## 2020-11-02 ENCOUNTER — Encounter: Payer: Self-pay | Admitting: Dermatology

## 2020-11-02 NOTE — Progress Notes (Signed)
   Follow-Up Visit   Subjective  Benjamin Valdez is a 60 y.o. male who presents for the following: Follow-up (Patient here today for 6 month follow up. No new concerns. Personal history of atypical moles and melanoma. No personal history of non mole skin cancer. No family history of atypical moles, melanoma or non mole skin cancer. ).  Follow-up melanoma in situ left upper back, check other moles. Location:  Duration:  Quality:  Associated Signs/Symptoms: Modifying Factors:  Severity:  Timing: Context:   Objective  Well appearing patient in no apparent distress; mood and affect are within normal limits. Right Upper Back Gray-brown 3 mm macule with dermoscopic atypia;defer until after trip x 5 weeks     Left Upper Back No sign repigmentation    All skin waist up examined.   Assessment & Plan    Neoplasm of uncertain behavior of skin Right Upper Back  We will schedule to return for biopsy after his trip.  Personal history of malignant melanoma of skin Left Upper Back  Annual skin examination      I, Lavonna Monarch, MD, have reviewed all documentation for this visit.  The documentation on 11/02/20 for the exam, diagnosis, procedures, and orders are all accurate and complete.

## 2020-12-12 ENCOUNTER — Other Ambulatory Visit: Payer: Self-pay | Admitting: Internal Medicine

## 2020-12-16 DIAGNOSIS — F3341 Major depressive disorder, recurrent, in partial remission: Secondary | ICD-10-CM | POA: Diagnosis not present

## 2020-12-16 DIAGNOSIS — F411 Generalized anxiety disorder: Secondary | ICD-10-CM | POA: Diagnosis not present

## 2020-12-17 ENCOUNTER — Encounter: Payer: Self-pay | Admitting: Dermatology

## 2020-12-17 ENCOUNTER — Other Ambulatory Visit: Payer: Self-pay

## 2020-12-17 ENCOUNTER — Ambulatory Visit (INDEPENDENT_AMBULATORY_CARE_PROVIDER_SITE_OTHER): Payer: Medicare HMO | Admitting: Dermatology

## 2020-12-17 ENCOUNTER — Other Ambulatory Visit: Payer: Self-pay | Admitting: Dermatology

## 2020-12-17 DIAGNOSIS — D485 Neoplasm of uncertain behavior of skin: Secondary | ICD-10-CM

## 2020-12-17 DIAGNOSIS — C4359 Malignant melanoma of other part of trunk: Secondary | ICD-10-CM | POA: Diagnosis not present

## 2020-12-17 DIAGNOSIS — D225 Melanocytic nevi of trunk: Secondary | ICD-10-CM

## 2020-12-17 NOTE — Patient Instructions (Signed)

## 2020-12-19 ENCOUNTER — Other Ambulatory Visit: Payer: Self-pay

## 2020-12-19 MED ORDER — ROSUVASTATIN CALCIUM 20 MG PO TABS: 20.0000 mg | ORAL_TABLET | Freq: Every day | ORAL | 3 refills | Status: DC

## 2020-12-24 ENCOUNTER — Telehealth (INDEPENDENT_AMBULATORY_CARE_PROVIDER_SITE_OTHER): Payer: Medicare HMO | Admitting: Dermatology

## 2020-12-24 ENCOUNTER — Encounter: Payer: Self-pay | Admitting: Dermatology

## 2020-12-24 DIAGNOSIS — C4359 Malignant melanoma of other part of trunk: Secondary | ICD-10-CM

## 2020-12-24 NOTE — Progress Notes (Signed)
   Follow-Up Visit   Subjective  Benjamin Valdez is a 60 y.o. male who presents for the following: Follow-up (Patient here today for possible biopsies on his back from previous appointment. ).  Here for biopsies of atypical moles on back; could not be done at last visit because of travel plans. Location:  Duration:  Quality:  Associated Signs/Symptoms: Modifying Factors:  Severity:  Timing: Context:   Objective  Well appearing patient in no apparent distress; mood and affect are within normal limits. Left Side Mid Back As always, patient has dozens of discolored macules of varying size, color, and shape.  Essentially all reinspected with dermoscopy and 3 show atypia, with the spot on the left outer back of most concern.       Right Mid Back       Right Upper Back         All skin waist up examined.   Assessment & Plan    Neoplasm of uncertain behavior of skin (3) Left Side Mid Back  Skin / nail biopsy Type of biopsy: tangential   Informed consent: discussed and consent obtained   Timeout: patient name, date of birth, surgical site, and procedure verified   Procedure prep:  Patient was prepped and draped in usual sterile fashion (Non sterile) Prep type:  Chlorhexidine Anesthesia: the lesion was anesthetized in a standard fashion   Anesthetic:  1% lidocaine w/ epinephrine 1-100,000 local infiltration Instrument used: flexible razor blade   Hemostasis achieved with: ferric subsulfate   Outcome: patient tolerated procedure well   Post-procedure details: sterile dressing applied and wound care instructions given   Dressing type: bandage and petrolatum    Specimen 1 - Surgical pathology Differential Diagnosis: R/O Atypia  Check Margins: yes  Right Mid Back  Skin / nail biopsy Type of biopsy: tangential   Informed consent: discussed and consent obtained   Timeout: patient name, date of birth, surgical site, and procedure verified   Procedure  prep:  Patient was prepped and draped in usual sterile fashion (Non sterile) Prep type:  Chlorhexidine Anesthesia: the lesion was anesthetized in a standard fashion   Anesthetic:  1% lidocaine w/ epinephrine 1-100,000 local infiltration Instrument used: flexible razor blade   Hemostasis achieved with: ferric subsulfate   Outcome: patient tolerated procedure well   Post-procedure details: sterile dressing applied and wound care instructions given   Dressing type: bandage and petrolatum    Specimen 2 - Surgical pathology Differential Diagnosis: Atypia  Check Margins: yes  Right Upper Back  Skin / nail biopsy Type of biopsy: tangential   Informed consent: discussed and consent obtained   Timeout: patient name, date of birth, surgical site, and procedure verified   Procedure prep:  Patient was prepped and draped in usual sterile fashion (Non sterile) Prep type:  Chlorhexidine Anesthesia: the lesion was anesthetized in a standard fashion   Anesthetic:  1% lidocaine w/ epinephrine 1-100,000 local infiltration Instrument used: flexible razor blade   Hemostasis achieved with: ferric subsulfate   Outcome: patient tolerated procedure well   Post-procedure details: sterile dressing applied and wound care instructions given   Dressing type: bandage and petrolatum    Specimen 3 - Surgical pathology Differential Diagnosis: R/O Atypia  Check Margins: yes      I, Lavonna Monarch, MD, have reviewed all documentation for this visit.  The documentation on 12/24/20 for the exam, diagnosis, procedures, and orders are all accurate and complete.

## 2020-12-24 NOTE — Telephone Encounter (Signed)
I called patient and told him the biopsy from the left outer back showed a level II, 0.4 mm melanoma which will require wide excision.  If my surgical schedule is too backed up he is amenable to referral to Dr. Maple Hudson or Janann August.  If we do not reach him within 1week with the time scheduled for excision then he will call us back.

## 2020-12-25 ENCOUNTER — Telehealth: Payer: Self-pay | Admitting: *Deleted

## 2020-12-25 NOTE — Telephone Encounter (Signed)
Left message with patient- tafeen already gave pathology results- per dr tafeen if he has a hour surgery appointment available he will treat or patient can go to [facility] they have something sooner.  It up to patient. Calling to see if patient wants [place] to do or [facility] center.

## 2020-12-25 NOTE — Telephone Encounter (Signed)
Patient called back and made 1 hour melanoma excision in October.  Patient wanted dr tafeen to treat.

## 2020-12-25 NOTE — Telephone Encounter (Signed)
-----   Message from Lavonna Monarch, MD sent at 12/25/2020  6:40 AM EDT ----- Diagnosis of melanoma discussed with patient yesterday (in phone documentation) and excision will be arranged.

## 2020-12-31 ENCOUNTER — Other Ambulatory Visit: Payer: Medicare HMO | Admitting: Internal Medicine

## 2021-01-02 ENCOUNTER — Encounter: Payer: Medicare HMO | Admitting: Internal Medicine

## 2021-01-14 ENCOUNTER — Other Ambulatory Visit: Payer: Medicare HMO | Admitting: Internal Medicine

## 2021-02-11 DIAGNOSIS — F411 Generalized anxiety disorder: Secondary | ICD-10-CM | POA: Diagnosis not present

## 2021-02-11 DIAGNOSIS — F3341 Major depressive disorder, recurrent, in partial remission: Secondary | ICD-10-CM | POA: Diagnosis not present

## 2021-02-13 ENCOUNTER — Encounter: Payer: Medicare HMO | Admitting: Dermatology

## 2021-02-25 ENCOUNTER — Other Ambulatory Visit: Payer: Self-pay

## 2021-02-25 ENCOUNTER — Other Ambulatory Visit: Payer: Medicare HMO | Admitting: Internal Medicine

## 2021-02-25 DIAGNOSIS — Z125 Encounter for screening for malignant neoplasm of prostate: Secondary | ICD-10-CM | POA: Diagnosis not present

## 2021-02-25 DIAGNOSIS — F419 Anxiety disorder, unspecified: Secondary | ICD-10-CM | POA: Diagnosis not present

## 2021-02-25 DIAGNOSIS — Z0000 Encounter for general adult medical examination without abnormal findings: Secondary | ICD-10-CM | POA: Diagnosis not present

## 2021-02-25 DIAGNOSIS — E7800 Pure hypercholesterolemia, unspecified: Secondary | ICD-10-CM | POA: Diagnosis not present

## 2021-02-25 DIAGNOSIS — Z87898 Personal history of other specified conditions: Secondary | ICD-10-CM

## 2021-02-25 DIAGNOSIS — R7302 Impaired glucose tolerance (oral): Secondary | ICD-10-CM | POA: Diagnosis not present

## 2021-02-25 DIAGNOSIS — F32A Depression, unspecified: Secondary | ICD-10-CM

## 2021-02-26 LAB — COMPLETE METABOLIC PANEL WITH GFR
AG Ratio: 1.7 (calc) (ref 1.0–2.5)
ALT: 12 U/L (ref 9–46)
AST: 19 U/L (ref 10–35)
Albumin: 4.4 g/dL (ref 3.6–5.1)
Alkaline phosphatase (APISO): 62 U/L (ref 35–144)
BUN: 21 mg/dL (ref 7–25)
CO2: 24 mmol/L (ref 20–32)
Calcium: 9.5 mg/dL (ref 8.6–10.3)
Chloride: 104 mmol/L (ref 98–110)
Creat: 1.16 mg/dL (ref 0.70–1.30)
Globulin: 2.6 g/dL (calc) (ref 1.9–3.7)
Glucose, Bld: 125 mg/dL — ABNORMAL HIGH (ref 65–99)
Potassium: 4.2 mmol/L (ref 3.5–5.3)
Sodium: 140 mmol/L (ref 135–146)
Total Bilirubin: 0.3 mg/dL (ref 0.2–1.2)
Total Protein: 7 g/dL (ref 6.1–8.1)
eGFR: 73 mL/min/{1.73_m2} (ref >=60)

## 2021-02-26 LAB — CBC WITH DIFFERENTIAL/PLATELET
Absolute Monocytes: 629 cells/uL (ref 200–950)
Basophils Absolute: 43 cells/uL (ref 0–200)
Basophils Relative: 0.5 %
Eosinophils Absolute: 60 cells/uL (ref 15–500)
Eosinophils Relative: 0.7 %
HCT: 44.9 % (ref 38.5–50.0)
Hemoglobin: 14.6 g/dL (ref 13.2–17.1)
Lymphs Abs: 816 cells/uL — ABNORMAL LOW (ref 850–3900)
MCH: 30 pg (ref 27.0–33.0)
MCHC: 32.5 g/dL (ref 32.0–36.0)
MCV: 92.4 fL (ref 80.0–100.0)
MPV: 11.1 fL (ref 7.5–12.5)
Monocytes Relative: 7.4 %
Neutro Abs: 6953 cells/uL (ref 1500–7800)
Neutrophils Relative %: 81.8 %
Platelets: 239 10*3/uL (ref 140–400)
RBC: 4.86 10*6/uL (ref 4.20–5.80)
RDW: 12.8 % (ref 11.0–15.0)
Total Lymphocyte: 9.6 %
WBC: 8.5 10*3/uL (ref 3.8–10.8)

## 2021-02-26 LAB — HEMOGLOBIN A1C
Hgb A1c MFr Bld: 5.7 % of total Hgb — ABNORMAL HIGH (ref <5.7)
Mean Plasma Glucose: 117 mg/dL
eAG (mmol/L): 6.5 mmol/L

## 2021-02-26 LAB — LIPID PANEL
Cholesterol: 187 mg/dL (ref <200)
HDL: 51 mg/dL (ref >=40)
LDL Cholesterol (Calc): 120 mg/dL (calc) — ABNORMAL HIGH
Non-HDL Cholesterol (Calc): 136 mg/dL (calc) — ABNORMAL HIGH (ref <130)
Total CHOL/HDL Ratio: 3.7 (calc) (ref <5.0)
Triglycerides: 69 mg/dL (ref <150)

## 2021-02-26 LAB — PSA: PSA: 1.35 ng/mL (ref <=4.00)

## 2021-02-27 DIAGNOSIS — F411 Generalized anxiety disorder: Secondary | ICD-10-CM | POA: Diagnosis not present

## 2021-02-27 DIAGNOSIS — F331 Major depressive disorder, recurrent, moderate: Secondary | ICD-10-CM | POA: Diagnosis not present

## 2021-02-27 DIAGNOSIS — F4322 Adjustment disorder with anxiety: Secondary | ICD-10-CM | POA: Diagnosis not present

## 2021-02-28 ENCOUNTER — Other Ambulatory Visit: Payer: Self-pay

## 2021-02-28 ENCOUNTER — Ambulatory Visit (INDEPENDENT_AMBULATORY_CARE_PROVIDER_SITE_OTHER): Payer: Medicare HMO | Admitting: Internal Medicine

## 2021-02-28 ENCOUNTER — Encounter: Payer: Self-pay | Admitting: Internal Medicine

## 2021-02-28 VITALS — BP 118/84 | HR 85 | Temp 98.3°F | Resp 16 | Ht 69.5 in | Wt 160.0 lb

## 2021-02-28 DIAGNOSIS — Z0000 Encounter for general adult medical examination without abnormal findings: Secondary | ICD-10-CM | POA: Diagnosis not present

## 2021-02-28 DIAGNOSIS — Z87898 Personal history of other specified conditions: Secondary | ICD-10-CM

## 2021-02-28 DIAGNOSIS — F419 Anxiety disorder, unspecified: Secondary | ICD-10-CM | POA: Diagnosis not present

## 2021-02-28 DIAGNOSIS — F32A Depression, unspecified: Secondary | ICD-10-CM

## 2021-02-28 DIAGNOSIS — R1115 Cyclical vomiting syndrome unrelated to migraine: Secondary | ICD-10-CM | POA: Diagnosis not present

## 2021-02-28 DIAGNOSIS — F411 Generalized anxiety disorder: Secondary | ICD-10-CM

## 2021-02-28 DIAGNOSIS — E7800 Pure hypercholesterolemia, unspecified: Secondary | ICD-10-CM | POA: Diagnosis not present

## 2021-02-28 DIAGNOSIS — F439 Reaction to severe stress, unspecified: Secondary | ICD-10-CM

## 2021-02-28 DIAGNOSIS — Z23 Encounter for immunization: Secondary | ICD-10-CM | POA: Diagnosis not present

## 2021-02-28 DIAGNOSIS — Z89512 Acquired absence of left leg below knee: Secondary | ICD-10-CM | POA: Diagnosis not present

## 2021-02-28 NOTE — Patient Instructions (Addendum)
Flu vaccine given.  He needs Covid booster, and at some point Pneumococcal vaccine.  Continue counseling.  Continue current medications.  Follow-up in 6 months.

## 2021-02-28 NOTE — Progress Notes (Signed)
Annual Wellness Visit     Patient: Benjamin Valdez, Male    DOB: 13-Aug-1960, 60 y.o.   MRN: [id number] Visit Date: 02/28/2021  Chief Complaint  Patient presents with   Annual Exam   Subjective    Benjamin Valdez is a 60 y.o. male who presents today for his Annual Wellness Visit.  HPI 60 year old Male also seen for health maintenance exam and evaluation of medical issues. 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  He has a leg prosthesis.  He is seen annually at [facility] regarding history of angiosarcoma of the left lower extremity.  He has no history of recurrence.  He likes to go to the gym and workout.  He has a history of cyclical vomiting and has had to go to the emergency department sometimes to get antinausea medication and IV fluids.  He is on chronic Elavil therapy.  He does not have migraine headaches that are sometimes associated with cyclical vomiting.  No known drug allergies.  Remote history of fractured clavicle.  Social history: Never married.  Does not smoke.  No alcohol consumption.  4-year college degree.  History of MRI left shoulder 2012 showing a probable labral tear.  Normal nuclear stress test in 2009.  Had colonoscopy 2014 by Dr. Watt Climes with 2 polyps removed.  Apparently these were benign and he was asked to come back in January 2024.  Family history: 1 brother with history of Crohn's disease.  Mother had osteoporosis, hyperlipidemia, atrial fibrillation, history of MI, mitral regurgitation and cardiac stent placement along with diabetes mellitus.  She was in her 90s when she passed away.  Father was in his late 74s with history of chronic back pain from lumbar stenosis, bladder cancer, diabetes mellitus, coronary stent placement, hyperlipidemia, hypertension COPD and obstructive sleep apnea.  Sister age 3 in good health.  Sister son has a history of drug addiction.  Past medical history he has a history  of depression treated with SSRI and has seen a therapist.  Takes PPI for GE reflux.  Hyperlipidemia treated with Crestor.  Mild glucose intolerance treated with diet.  LDL is 120.  Has not been watching diet recently.  Fasting glucose is 125.  Hemoglobin A1c however is stable at 5.7%.     Patient Care Team: Elby Showers, MD as PCP - General (Internal Medicine) Lavonna Monarch, MD as Consulting Physician (Dermatology)  Review of Systems-recently thought he had a relationship with a male friend but it turns out that it apparently was not working for her and this is been disconcerting to him.  He liked spending time with her.  Situation discussed at length.   Objective    Vitals: BP 118/84   Pulse 85   Temp 98.3 F (36.8 C) (Tympanic)   Resp 16   Ht 5' 9.5" (1.765 m)   Wt 160 lb (72.6 kg)   SpO2 95%   BMI 23.29 kg/m   Physical Exam  Skin: Warm and dry.  Nodes none.  TMs clear.  Pharynx is clear.  Neck is supple without JVD thyromegaly or carotid bruits.  Chest is clear to auscultation.  Cardiac exam: Regular rate and rhythm abdomen is soft nondistended without hepatosplenomegaly masses or tenderness.  Prostate is normal.  Affect thought and judgment appear to be normal.   Most recent functional status assessment: In your present state of health, do you have any difficulty performing the following  activities: 02/28/2021  Vision? N  Difficulty concentrating or making decisions? N  Walking or climbing stairs? N  Dressing or bathing? N  Doing errands, shopping? N  Preparing Food and eating ? N  Using the Toilet? N  In the past six months, have you accidently leaked urine? N  Do you have problems with loss of bowel control? N  Managing your Medications? N  Managing your Finances? N  Housekeeping or managing your Housekeeping? N  Some recent data might be hidden   Most recent fall risk assessment: Fall Risk  02/28/2021  Falls in the past year? 0  Number falls in past yr: 0   Injury with Fall? 0  Risk for fall due to : Impaired balance/gait  Follow up Falls evaluation completed    Most recent depression screenings: PHQ 2/9 Scores 02/28/2021 01/01/2020  PHQ - 2 Score 5 3  PHQ- 9 Score 14 7   Most recent cognitive screening: 6CIT Screen 02/28/2021  What Year? 0 points  What month? 0 points  What time? 0 points  Count back from 20 0 points  Months in reverse 2 points  Repeat phrase 10 points  Total Score 12       Assessment & Plan   Situational stress discussed.  He will be speaking with therapist.  History of anxiety and depression.  Currently on Paxil 40 mg daily and amitriptyline 50 mg at bedtime  History of cyclical vomiting.  Treated with Valium if needed.  GE reflux treated with Prilosec  Hyperlipidemia-patient on Crestor 20 mg daily.  LDL is 120.  Follow-up in 6 months    Annual wellness visit done today including the all of the following: Reviewed patient's Family Medical History Reviewed and updated list of patient's medical providers Assessment of cognitive impairment was done Assessed patient's functional ability Established a written schedule for health screening [place] Completed and Reviewed  Discussed health benefits of physical activity, and encouraged him to engage in regular exercise appropriate for his age and condition.        IElby Showers, MD, have reviewed all documentation for this visit. The documentation on 02/28/21 for the exam, diagnosis, procedures, and orders are all accurate and complete.    Angus Seller, CMA

## 2021-03-17 DIAGNOSIS — F331 Major depressive disorder, recurrent, moderate: Secondary | ICD-10-CM | POA: Diagnosis not present

## 2021-03-17 DIAGNOSIS — F411 Generalized anxiety disorder: Secondary | ICD-10-CM | POA: Diagnosis not present

## 2021-03-17 DIAGNOSIS — F4322 Adjustment disorder with anxiety: Secondary | ICD-10-CM | POA: Diagnosis not present

## 2021-03-31 DIAGNOSIS — F331 Major depressive disorder, recurrent, moderate: Secondary | ICD-10-CM | POA: Diagnosis not present

## 2021-03-31 DIAGNOSIS — F411 Generalized anxiety disorder: Secondary | ICD-10-CM | POA: Diagnosis not present

## 2021-04-23 ENCOUNTER — Emergency Department (HOSPITAL_COMMUNITY)
Admission: EM | Admit: 2021-04-23 | Discharge: 2021-04-23 | Disposition: A | Discharge status: Home / Self Care | Payer: Medicare HMO | Attending: Emergency Medicine | Admitting: Emergency Medicine

## 2021-04-23 ENCOUNTER — Encounter (HOSPITAL_COMMUNITY): Payer: Self-pay

## 2021-04-23 ENCOUNTER — Other Ambulatory Visit: Payer: Self-pay

## 2021-04-23 DIAGNOSIS — R112 Nausea with vomiting, unspecified: Secondary | ICD-10-CM

## 2021-04-23 DIAGNOSIS — Z79899 Other long term (current) drug therapy: Secondary | ICD-10-CM | POA: Diagnosis not present

## 2021-04-23 DIAGNOSIS — F331 Major depressive disorder, recurrent, moderate: Secondary | ICD-10-CM | POA: Diagnosis not present

## 2021-04-23 DIAGNOSIS — R9431 Abnormal electrocardiogram [ECG] [EKG]: Secondary | ICD-10-CM | POA: Diagnosis not present

## 2021-04-23 DIAGNOSIS — R1011 Right upper quadrant pain: Secondary | ICD-10-CM | POA: Diagnosis not present

## 2021-04-23 DIAGNOSIS — F411 Generalized anxiety disorder: Secondary | ICD-10-CM | POA: Diagnosis not present

## 2021-04-23 DIAGNOSIS — R109 Unspecified abdominal pain: Secondary | ICD-10-CM | POA: Diagnosis present

## 2021-04-23 LAB — URINALYSIS, ROUTINE W REFLEX MICROSCOPIC
Bacteria, UA: NONE SEEN
Bilirubin Urine: NEGATIVE
Glucose, UA: NEGATIVE mg/dL
Hgb urine dipstick: NEGATIVE
Ketones, ur: 5 mg/dL — AB
Leukocytes,Ua: NEGATIVE
Nitrite: NEGATIVE
Protein, ur: 30 mg/dL — AB
Specific Gravity, Urine: 1.034 — ABNORMAL HIGH (ref 1.005–1.030)
pH: 5 (ref 5.0–8.0)

## 2021-04-23 LAB — RAPID URINE DRUG SCREEN, HOSP PERFORMED
Amphetamines: NOT DETECTED
Barbiturates: NOT DETECTED
Benzodiazepines: POSITIVE — AB
Cocaine: NOT DETECTED
Opiates: NOT DETECTED
Tetrahydrocannabinol: POSITIVE — AB

## 2021-04-23 LAB — COMPREHENSIVE METABOLIC PANEL
ALT: 19 U/L (ref 0–44)
AST: 25 U/L (ref 15–41)
Albumin: 4.4 g/dL (ref 3.5–5.0)
Alkaline Phosphatase: 67 U/L (ref 38–126)
Anion gap: 12 (ref 5–15)
BUN: 36 mg/dL — ABNORMAL HIGH (ref 6–20)
CO2: 22 mmol/L (ref 22–32)
Calcium: 9.8 mg/dL (ref 8.9–10.3)
Chloride: 105 mmol/L (ref 98–111)
Creatinine, Ser: 1.29 mg/dL — ABNORMAL HIGH (ref 0.61–1.24)
GFR, Estimated: >60 mL/min (ref >60)
Glucose, Bld: 169 mg/dL — ABNORMAL HIGH (ref 70–99)
Potassium: 4.5 mmol/L (ref 3.5–5.1)
Sodium: 139 mmol/L (ref 135–145)
Total Bilirubin: 0.5 mg/dL (ref 0.3–1.2)
Total Protein: 7.7 g/dL (ref 6.5–8.1)

## 2021-04-23 LAB — CBC
HCT: 45 % (ref 39.0–52.0)
Hemoglobin: 15.3 g/dL (ref 13.0–17.0)
MCH: 31 pg (ref 26.0–34.0)
MCHC: 34 g/dL (ref 30.0–36.0)
MCV: 91.3 fL (ref 80.0–100.0)
Platelets: 300 10*3/uL (ref 150–400)
RBC: 4.93 MIL/uL (ref 4.22–5.81)
RDW: 13.5 % (ref 11.5–15.5)
WBC: 8.6 10*3/uL (ref 4.0–10.5)
nRBC: 0 % (ref 0.0–0.2)

## 2021-04-23 LAB — LIPASE, BLOOD: Lipase: 35 U/L (ref 11–51)

## 2021-04-23 MED ORDER — ONDANSETRON 8 MG PO TBDP: 8.0000 mg | ORAL_TABLET | Freq: Three times a day (TID) | ORAL | 0 refills | Status: DC | PRN

## 2021-04-23 MED ORDER — ONDANSETRON 4 MG PO TBDP
4.0000 mg | ORAL_TABLET | Freq: Once | ORAL | Status: AC | PRN
  Administered 2021-04-23: 4 mg via ORAL
  Filled 2021-04-23: qty 1

## 2021-04-23 MED ORDER — ONDANSETRON HCL 4 MG/2ML IJ SOLN
4.0000 mg | Freq: Once | INTRAMUSCULAR | Status: AC
Start: 2021-04-23 — End: 2021-04-23
  Administered 2021-04-23: 4 mg via INTRAVENOUS
  Filled 2021-04-23: qty 2

## 2021-04-23 MED ORDER — LACTATED RINGERS IV BOLUS
1000.0000 mL | Freq: Once | INTRAVENOUS | Status: AC
  Administered 2021-04-23: 1000 mL via INTRAVENOUS

## 2021-04-23 MED ORDER — HALOPERIDOL LACTATE 5 MG/ML IJ SOLN
2.5000 mg | Freq: Once | INTRAMUSCULAR | Status: AC
  Administered 2021-04-23: 2.5 mg via INTRAVENOUS
  Filled 2021-04-23: qty 1

## 2021-04-23 MED ORDER — FENTANYL CITRATE PF 50 MCG/ML IJ SOSY
50.0000 ug | PREFILLED_SYRINGE | Freq: Once | INTRAMUSCULAR | Status: AC
  Administered 2021-04-23: 50 ug via INTRAVENOUS
  Filled 2021-04-23: qty 1

## 2021-04-23 MED ORDER — METOCLOPRAMIDE HCL 5 MG/ML IJ SOLN
10.0000 mg | Freq: Once | INTRAMUSCULAR | Status: AC
Start: 2021-04-23 — End: 2021-04-23
  Administered 2021-04-23: 10 mg via INTRAVENOUS
  Filled 2021-04-23: qty 2

## 2021-04-23 NOTE — ED Provider Notes (Signed)
[facility] DEPT Provider Note   CSN: [id number] Arrival date & time: 04/23/21  1240     History  Chief Complaint  Patient presents with   Emesis   Abdominal Pain    Benjamin Valdez is a 61 y.o. male.   Emesis Associated symptoms: abdominal pain   Abdominal Pain Associated symptoms: nausea and vomiting   Associated symptoms: no shortness of breath   Patient presents with nausea and vomiting.  Abdominal pain.  Began today.  Last bowel movement yesterday.  History of cyclic vomiting versus cannabinol hyperemesis.  Has been doing well for the last 2 years however.  Now developed more vomiting.  Diaphoretic.  Similar to previous episodes.  Dull upper abdominal pain.  Had previously been using marijuana but states still use occasionally.    Home Medications Prior to Admission medications   Medication Sig Start Date End Date Taking? Authorizing Provider  amitriptyline (ELAVIL) 50 MG tablet Take 50-100 mg by mouth at bedtime as needed for sleep.   Yes [provider]  amoxicillin (AMOXIL) 500 MG capsule Take 500 mg by mouth 3 (three) times daily. For 10 days 04/17/21 04/26/21 Yes [provider]  Melatonin 10 MG TABS Take 10 mg by mouth at bedtime.    Yes [provider]  omeprazole (PRILOSEC) 40 MG capsule TAKE 1 CAPSULE BY MOUTH EVERY DAY Patient taking differently: Take 40 mg by mouth daily. 12/12/20  Yes Baxley, Cresenciano Lick, MD  rosuvastatin (CRESTOR) 20 MG tablet Take 1 tablet (20 mg total) by mouth daily. 12/19/20  Yes Baxley, Cresenciano Lick, MD  diazepam (VALIUM) 5 MG tablet TAKE 1 TABLET BY MOUTH EVERY 8 HOURS AS NEEDED FOR ANXIETY Patient taking differently: Take 5 mg by mouth daily as needed for anxiety. 04/14/17   Elby Showers, MD  gabapentin (NEURONTIN) 100 MG capsule Take 300 mg by mouth in the morning, at noon, and at bedtime. 04/15/21   [provider]  PARoxetine (PAXIL) 40 MG tablet Take 40 mg by mouth daily.  02/13/20   [provider]      Allergies    Patient has no known allergies.    Review of Systems   Review of Systems  Constitutional:  Positive for appetite change.  HENT:  Negative for congestion.   Respiratory:  Negative for shortness of breath.   Gastrointestinal:  Positive for abdominal pain, nausea and vomiting.  Genitourinary:  Negative for flank pain.  Musculoskeletal:  Negative for back pain.  Skin:  Negative for rash.  Neurological:  Negative for weakness.  Psychiatric/Behavioral:  Negative for confusion.    Physical Exam Updated Vital Signs BP (!) 161/105 (BP Location: Left Arm)    Pulse 88    Temp 98 F (36.7 C) (Axillary)    Resp 16    SpO2 99%  Physical Exam Vitals and nursing note reviewed.  Constitutional:      Appearance: He is diaphoretic.  HENT:     Head: Atraumatic.  Cardiovascular:     Rate and Rhythm: Regular rhythm.  Abdominal:     Hernia: No hernia is present.     Comments: Mild upper abdominal tenderness without rebound or guarding.  Skin:    General: Skin is warm.     Capillary Refill: Capillary refill takes less than 2 seconds.  Neurological:     Mental Status: He is alert and oriented to person, place, and time.    ED Results / Procedures / Treatments   Labs (all  labs ordered are listed, but only abnormal results are displayed) Labs Reviewed  COMPREHENSIVE METABOLIC PANEL - Abnormal; Notable for the following components:      Result Value   Glucose, Bld 169 (*)    BUN 36 (*)    Creatinine, Ser 1.29 (*)    All other components within normal limits  URINALYSIS, ROUTINE W REFLEX MICROSCOPIC - Abnormal; Notable for the following components:   APPearance HAZY (*)    Specific Gravity, Urine 1.034 (*)    Ketones, ur 5 (*)    Protein, ur 30 (*)    All other components within normal limits  RAPID URINE DRUG SCREEN, HOSP PERFORMED - Abnormal; Notable for the following components:   Benzodiazepines POSITIVE (*)     Tetrahydrocannabinol POSITIVE (*)    All other components within normal limits  LIPASE, BLOOD  CBC    EKG EKG Interpretation  Date/Time:  Wednesday April 23 2021 13:24:25 EST Ventricular Rate:  69 PR Interval:  150 QRS Duration: 102 QT Interval:  410 QTC Calculation: 440 R Axis:   80 Text Interpretation: Sinus rhythm Confirmed by Davonna Belling [phone]) on 04/23/2021 4:18:56 PM  Radiology No results found.  Procedures Procedures    Medications Ordered in ED Medications  ondansetron (ZOFRAN-ODT) disintegrating tablet 4 mg (4 mg Oral Given 04/23/21 1511)  lactated ringers bolus 1,000 mL (0 mLs Intravenous Stopped 04/23/21 1511)  metoCLOPramide (REGLAN) injection 10 mg (10 mg Intravenous Given 04/23/21 1408)  fentaNYL (SUBLIMAZE) injection 50 mcg (50 mcg Intravenous Given 04/23/21 1406)  haloperidol lactate (HALDOL) injection 2.5 mg (2.5 mg Intravenous Given 04/23/21 1626)  lactated ringers bolus 1,000 mL (1,000 mLs Intravenous New Bag/Given 04/23/21 1627)    ED Course/ Medical Decision Making/ A&P                           Medical Decision Making  Patient presents with abdominal pain.  Nausea or vomiting.  History of gastroparesis versus cannabinoid hyperemesis.  Has had episodes severe enough to require admission in the past. Reviewed outside records of previous admission notes.  History comes from patient. Lab work shows a mild dehydration.Creatinine mildly elevated.  Interpreted by me.   Imaging not ordered. Healthcare affected by social determinant of patient's substance use.  History of gastroparesis.  Has had Reglan and Zofran with continued vomiting.  Benign exam overall.  Has had previous imaging that was reassuring will not repeat at this time.  Increase fluid boluses and will now add some Haldol.  Care turned over to Dr. Tomi Bamberger.        Final Clinical Impression(s) / ED Diagnoses Final diagnoses:  Nausea and vomiting, unspecified vomiting type    Rx / DC  Orders ED Discharge Orders     None         Davonna Belling, MD 04/23/21 1629

## 2021-04-23 NOTE — ED Notes (Signed)
Pt came to doorway and stated nausea has not improved

## 2021-04-23 NOTE — ED Provider Triage Note (Signed)
Emergency Medicine Provider Triage Evaluation Note  Benjamin Valdez , a 61 y.o. male  was evaluated in triage.  Pt complains of n/v.  Review of Systems  Positive: Nausea, vomiting, abd pain Negative: Fever, cough  Physical Exam  BP (!) 159/79    Pulse 78    Temp 98 F (36.7 C) (Axillary)    Resp 18    SpO2 99%  Gen:   Awake, actively vomiting Resp:  Normal effort  MSK:   Moves extremities without difficulty  Other:    Medical Decision Making  Medically screening exam initiated at 1:15 PM.  Appropriate orders placed.  Benjamin Valdez was informed that the remainder of the evaluation will be completed by another provider, this initial triage assessment does not replace that evaluation, and the importance of remaining in the ED until their evaluation is complete.  Pt here with persistent nausea, vomiting x 7 hrs.  Hx of sarcoma, and hx of marijuana abuse.  Appears very uncomfortable.   Domenic Moras, PA-C 04/23/21 1318

## 2021-04-23 NOTE — ED Triage Notes (Signed)
Pt c/o emesis starting today. Pt states he has a disorder that causes vomiting episodes. Pt c/o abdominal pain starting today. Pt last bowel movement was yesterday.

## 2021-04-23 NOTE — ED Provider Notes (Signed)
Patient initially seen by Dr. Alvino Chapel complete.  Please see his note Physical Exam  BP (!) 168/102    Pulse 83    Temp 98 F (36.7 C) (Axillary)    Resp 18    SpO2 99%   Physical Exam Alert and awake persistently vomiting  Procedures  Procedures  ED Course / MDM   Clinical Course as of 04/23/21 2045  Wed Apr 23, 2021  1756 Patient is still having persistent nausea and vomiting.  He also had abdominal pain earlier.  Additional Zofran IV ordered.  I will order CT scan to rule out any obstructive process [JK]  2043 Patient states he is actually feeling better now.  Abdomen is benign.  CT scan was not performed however he states he is ready for discharge and does not feel like the scan is necessary [JK]  2044 Repeat exam, patient is standing up walk around without discomfort.  Abdomen is nontender [JK]    Clinical Course User Index [JK] Dorie Rank, MD   Medical Decision Making  Patient presented with persistent nausea and vomiting.  Patient turned over in signout.  Patient has improved significantly with treatment in the emergency room.  Considered CT scan but at this time his symptoms have all resolved.  He does not have any tenderness.  He feels completely resolved and is ready for discharge.  Suspect symptoms are related to gastroparesis.  Doubt obstruction, or acute infection      Dorie Rank, MD 04/23/21 2045

## 2021-04-23 NOTE — ED Notes (Signed)
Pt pacing around the room. Pt states he has to keep moving to keep from throwing up.

## 2021-04-23 NOTE — ED Notes (Signed)
Pt provided with urinal

## 2021-04-23 NOTE — Discharge Instructions (Signed)
Take the medications as needed for nausea and vomiting.  Follow-up with your doctor to be rechecked.  Return as needed for fever or worsening symptoms

## 2021-04-23 NOTE — ED Notes (Signed)
Dc instructions and scripts reviewed with pt no questions or concerns at this time will follow up with pcp as needed.

## 2021-04-25 ENCOUNTER — Other Ambulatory Visit: Payer: Self-pay

## 2021-04-25 ENCOUNTER — Encounter (HOSPITAL_COMMUNITY): Payer: Self-pay

## 2021-04-25 ENCOUNTER — Emergency Department (HOSPITAL_COMMUNITY)
Admission: EM | Admit: 2021-04-25 | Discharge: 2021-04-26 | Disposition: A | Discharge status: Home / Self Care | Payer: Medicare HMO | Attending: Student | Admitting: Student

## 2021-04-25 ENCOUNTER — Emergency Department (HOSPITAL_COMMUNITY): Payer: Medicare HMO

## 2021-04-25 DIAGNOSIS — R112 Nausea with vomiting, unspecified: Secondary | ICD-10-CM | POA: Diagnosis not present

## 2021-04-25 DIAGNOSIS — R1013 Epigastric pain: Secondary | ICD-10-CM | POA: Diagnosis not present

## 2021-04-25 DIAGNOSIS — K4090 Unilateral inguinal hernia, without obstruction or gangrene, not specified as recurrent: Secondary | ICD-10-CM | POA: Diagnosis not present

## 2021-04-25 DIAGNOSIS — Z5321 Procedure and treatment not carried out due to patient leaving prior to being seen by health care provider: Secondary | ICD-10-CM | POA: Diagnosis not present

## 2021-04-25 DIAGNOSIS — K429 Umbilical hernia without obstruction or gangrene: Secondary | ICD-10-CM | POA: Diagnosis not present

## 2021-04-25 DIAGNOSIS — K3189 Other diseases of stomach and duodenum: Secondary | ICD-10-CM | POA: Diagnosis not present

## 2021-04-25 DIAGNOSIS — K7689 Other specified diseases of liver: Secondary | ICD-10-CM | POA: Diagnosis not present

## 2021-04-25 LAB — CBC WITH DIFFERENTIAL/PLATELET
Abs Immature Granulocytes: 0.03 10*3/uL (ref 0.00–0.07)
Basophils Absolute: 0 10*3/uL (ref 0.0–0.1)
Basophils Relative: 0 %
Eosinophils Absolute: 0 10*3/uL (ref 0.0–0.5)
Eosinophils Relative: 0 %
HCT: 43.1 % (ref 39.0–52.0)
Hemoglobin: 14.5 g/dL (ref 13.0–17.0)
Immature Granulocytes: 0 %
Lymphocytes Relative: 9 %
Lymphs Abs: 0.8 10*3/uL (ref 0.7–4.0)
MCH: 30.7 pg (ref 26.0–34.0)
MCHC: 33.6 g/dL (ref 30.0–36.0)
MCV: 91.1 fL (ref 80.0–100.0)
Monocytes Absolute: 0.4 10*3/uL (ref 0.1–1.0)
Monocytes Relative: 4 %
Neutro Abs: 7.8 10*3/uL — ABNORMAL HIGH (ref 1.7–7.7)
Neutrophils Relative %: 87 %
Platelets: 249 10*3/uL (ref 150–400)
RBC: 4.73 MIL/uL (ref 4.22–5.81)
RDW: 13.3 % (ref 11.5–15.5)
WBC: 9.1 10*3/uL (ref 4.0–10.5)
nRBC: 0 % (ref 0.0–0.2)

## 2021-04-25 LAB — COMPREHENSIVE METABOLIC PANEL
ALT: 21 U/L (ref 0–44)
AST: 31 U/L (ref 15–41)
Albumin: 4.6 g/dL (ref 3.5–5.0)
Alkaline Phosphatase: 64 U/L (ref 38–126)
Anion gap: 14 (ref 5–15)
BUN: 27 mg/dL — ABNORMAL HIGH (ref 6–20)
CO2: 20 mmol/L — ABNORMAL LOW (ref 22–32)
Calcium: 9.6 mg/dL (ref 8.9–10.3)
Chloride: 105 mmol/L (ref 98–111)
Creatinine, Ser: 1.16 mg/dL (ref 0.61–1.24)
GFR, Estimated: >60 mL/min (ref >60)
Glucose, Bld: 112 mg/dL — ABNORMAL HIGH (ref 70–99)
Potassium: 3.4 mmol/L — ABNORMAL LOW (ref 3.5–5.1)
Sodium: 139 mmol/L (ref 135–145)
Total Bilirubin: 0.7 mg/dL (ref 0.3–1.2)
Total Protein: 7.7 g/dL (ref 6.5–8.1)

## 2021-04-25 LAB — LIPASE, BLOOD: Lipase: 30 U/L (ref 11–51)

## 2021-04-25 MED ORDER — IOHEXOL 350 MG/ML SOLN
80.0000 mL | Freq: Once | INTRAVENOUS | Status: AC | PRN
  Administered 2021-04-25: 80 mL via INTRAVENOUS

## 2021-04-25 NOTE — ED Provider Triage Note (Signed)
Emergency Medicine Provider Triage Evaluation Note  Benjamin Valdez , a 61 y.o. male  was evaluated in triage.  Pt complains of nausea and vomiting.  Patient was in the hospital for this 2 days ago, reportedly got better however today he had an alcoholic drink which appeared to restart his nausea and vomiting.  Endorses marijuana use a few times a week.  Associated epigastric abdominal pain.  No diarrhea.  Tried to manage this nausea and vomiting at home with the Zofran he was discharged with earlier this week however it has not helped.  Review of Systems  As above  Physical Exam  BP (!) 227/118 (BP Location: Right Arm)    Pulse 70    Temp 98.4 F (36.9 C) (Oral)    Resp (!) 22    Ht 5\' 9"  (1.753 m)    Wt 72.6 kg    SpO2 95%    BMI 23.63 kg/m  Gen:   Awake, no distress   Resp:  Normal effort  MSK:   Moves extremities without difficulty  Other:  Right upper quadrant epigastric tenderness.  Patient guarding abdomen  Medical Decision Making  Medically screening exam initiated at 10:04 PM.  Appropriate orders placed.  Benjamin Valdez was informed that the remainder of the evaluation will be completed by another provider, this initial triage assessment does not replace that evaluation, and the importance of remaining in the ED until their evaluation is complete.    Patient hypertensive to 220s over 110s in triage x2.  No history of hypertension.  IV placed for more rapid CT exam   Benjamin Hammock, PA-C 04/25/21 2205

## 2021-04-25 NOTE — ED Triage Notes (Signed)
Pt reports nausea and vomiting. Was sent home with zofran rx. Had an alcoholic beverage today and began vomiting again.

## 2021-04-26 NOTE — ED Notes (Signed)
Pt eloped from waiting area against recommendations. Unable to obtain updated vital signs. IV removed. Pt alert, and ambulatory.

## 2021-04-26 NOTE — ED Notes (Signed)
Pt states that the wait is to long and he will be returning home.

## 2021-04-29 DIAGNOSIS — F331 Major depressive disorder, recurrent, moderate: Secondary | ICD-10-CM | POA: Diagnosis not present

## 2021-04-29 DIAGNOSIS — F411 Generalized anxiety disorder: Secondary | ICD-10-CM | POA: Diagnosis not present

## 2021-05-05 DIAGNOSIS — H43393 Other vitreous opacities, bilateral: Secondary | ICD-10-CM | POA: Diagnosis not present

## 2021-05-05 DIAGNOSIS — Z0100 Encounter for examination of eyes and vision without abnormal findings: Secondary | ICD-10-CM | POA: Diagnosis not present

## 2021-05-07 ENCOUNTER — Telehealth: Payer: Self-pay | Admitting: Internal Medicine

## 2021-05-07 DIAGNOSIS — F411 Generalized anxiety disorder: Secondary | ICD-10-CM | POA: Diagnosis not present

## 2021-05-07 DIAGNOSIS — F331 Major depressive disorder, recurrent, moderate: Secondary | ICD-10-CM | POA: Diagnosis not present

## 2021-05-07 NOTE — Telephone Encounter (Signed)
Benjamin Valdez  [phone]  Benjamin Valdez called to say he had been to the ED a couple of times but he is good now and does not need anything, he will see [place] at his next visit.

## 2021-05-13 DIAGNOSIS — F411 Generalized anxiety disorder: Secondary | ICD-10-CM | POA: Diagnosis not present

## 2021-05-13 DIAGNOSIS — F331 Major depressive disorder, recurrent, moderate: Secondary | ICD-10-CM | POA: Diagnosis not present

## 2021-05-19 DIAGNOSIS — F331 Major depressive disorder, recurrent, moderate: Secondary | ICD-10-CM | POA: Diagnosis not present

## 2021-05-19 DIAGNOSIS — F411 Generalized anxiety disorder: Secondary | ICD-10-CM | POA: Diagnosis not present

## 2021-06-02 DIAGNOSIS — F411 Generalized anxiety disorder: Secondary | ICD-10-CM | POA: Diagnosis not present

## 2021-06-02 DIAGNOSIS — F3341 Major depressive disorder, recurrent, in partial remission: Secondary | ICD-10-CM | POA: Diagnosis not present

## 2021-06-09 DIAGNOSIS — F411 Generalized anxiety disorder: Secondary | ICD-10-CM | POA: Diagnosis not present

## 2021-06-09 DIAGNOSIS — F331 Major depressive disorder, recurrent, moderate: Secondary | ICD-10-CM | POA: Diagnosis not present

## 2021-06-19 DIAGNOSIS — F331 Major depressive disorder, recurrent, moderate: Secondary | ICD-10-CM | POA: Diagnosis not present

## 2021-06-19 DIAGNOSIS — F411 Generalized anxiety disorder: Secondary | ICD-10-CM | POA: Diagnosis not present

## 2021-07-01 DIAGNOSIS — F411 Generalized anxiety disorder: Secondary | ICD-10-CM | POA: Diagnosis not present

## 2021-07-01 DIAGNOSIS — F331 Major depressive disorder, recurrent, moderate: Secondary | ICD-10-CM | POA: Diagnosis not present

## 2021-07-07 DIAGNOSIS — F411 Generalized anxiety disorder: Secondary | ICD-10-CM | POA: Diagnosis not present

## 2021-07-07 DIAGNOSIS — F331 Major depressive disorder, recurrent, moderate: Secondary | ICD-10-CM | POA: Diagnosis not present

## 2021-07-11 DIAGNOSIS — Z89512 Acquired absence of left leg below knee: Secondary | ICD-10-CM | POA: Diagnosis not present

## 2021-07-17 DIAGNOSIS — F331 Major depressive disorder, recurrent, moderate: Secondary | ICD-10-CM | POA: Diagnosis not present

## 2021-07-17 DIAGNOSIS — F411 Generalized anxiety disorder: Secondary | ICD-10-CM | POA: Diagnosis not present

## 2021-08-05 DIAGNOSIS — F331 Major depressive disorder, recurrent, moderate: Secondary | ICD-10-CM | POA: Diagnosis not present

## 2021-08-05 DIAGNOSIS — F411 Generalized anxiety disorder: Secondary | ICD-10-CM | POA: Diagnosis not present

## 2021-08-26 ENCOUNTER — Other Ambulatory Visit: Payer: Medicare HMO

## 2021-08-29 ENCOUNTER — Ambulatory Visit: Payer: Medicare HMO | Admitting: Internal Medicine

## 2021-09-01 DIAGNOSIS — F411 Generalized anxiety disorder: Secondary | ICD-10-CM | POA: Diagnosis not present

## 2021-09-01 DIAGNOSIS — F3341 Major depressive disorder, recurrent, in partial remission: Secondary | ICD-10-CM | POA: Diagnosis not present

## 2021-09-01 DIAGNOSIS — F331 Major depressive disorder, recurrent, moderate: Secondary | ICD-10-CM | POA: Diagnosis not present

## 2021-09-09 ENCOUNTER — Other Ambulatory Visit: Payer: Medicare HMO

## 2021-09-09 DIAGNOSIS — E7800 Pure hypercholesterolemia, unspecified: Secondary | ICD-10-CM

## 2021-09-10 LAB — HEPATIC FUNCTION PANEL
AG Ratio: 2 (calc) (ref 1.0–2.5)
ALT: 15 U/L (ref 9–46)
AST: 25 U/L (ref 10–35)
Albumin: 4.5 g/dL (ref 3.6–5.1)
Alkaline phosphatase (APISO): 63 U/L (ref 35–144)
Bilirubin, Direct: 0.1 mg/dL (ref 0.0–0.2)
Globulin: 2.2 g/dL (calc) (ref 1.9–3.7)
Indirect Bilirubin: 0.4 mg/dL (calc) (ref 0.2–1.2)
Total Bilirubin: 0.5 mg/dL (ref 0.2–1.2)
Total Protein: 6.7 g/dL (ref 6.1–8.1)

## 2021-09-10 LAB — LIPID PANEL
Cholesterol: 184 mg/dL (ref <200)
HDL: 58 mg/dL (ref >=40)
LDL Cholesterol (Calc): 108 mg/dL (calc) — ABNORMAL HIGH
Non-HDL Cholesterol (Calc): 126 mg/dL (calc) (ref <130)
Total CHOL/HDL Ratio: 3.2 (calc) (ref <5.0)
Triglycerides: 88 mg/dL (ref <150)

## 2021-09-11 ENCOUNTER — Ambulatory Visit (INDEPENDENT_AMBULATORY_CARE_PROVIDER_SITE_OTHER): Payer: Medicare HMO | Admitting: Internal Medicine

## 2021-09-11 ENCOUNTER — Encounter: Payer: Self-pay | Admitting: Internal Medicine

## 2021-09-11 VITALS — BP 120/88 | HR 64 | Temp 97.1°F | Ht 69.0 in | Wt 146.5 lb

## 2021-09-11 DIAGNOSIS — E7800 Pure hypercholesterolemia, unspecified: Secondary | ICD-10-CM | POA: Diagnosis not present

## 2021-09-11 DIAGNOSIS — R1115 Cyclical vomiting syndrome unrelated to migraine: Secondary | ICD-10-CM

## 2021-09-11 DIAGNOSIS — Z89512 Acquired absence of left leg below knee: Secondary | ICD-10-CM

## 2021-09-11 DIAGNOSIS — Z87898 Personal history of other specified conditions: Secondary | ICD-10-CM | POA: Diagnosis not present

## 2021-09-11 DIAGNOSIS — Z85820 Personal history of malignant melanoma of skin: Secondary | ICD-10-CM | POA: Diagnosis not present

## 2021-09-11 DIAGNOSIS — F32A Anxiety disorder, unspecified: Secondary | ICD-10-CM

## 2021-09-11 DIAGNOSIS — F419 Anxiety disorder, unspecified: Secondary | ICD-10-CM

## 2021-09-11 NOTE — Progress Notes (Signed)
   Subjective:    Patient ID: Benjamin Valdez, male    DOB: 1961-03-17, 61 y.o.   MRN: [id number]  HPI 61 year old Male seen for 6 month follow up.  History of depression.  He has regular counseling.  He has cyclical vomiting.  Last presented to the emergency department January 2023 with an episode.  His parents are deceased and he stayed with them for a long time and took care of them.  He is having to adjust his life without that responsibility.  He is trying to go out a bit more.  Some relationships are disappointing.  He has hyperlipidemia and is on statin medication.  Lipid panel is good.  LDL slightly elevated at 108.  He is on Crestor generic 20 mg daily.  He is on Paxil 40 mg daily and Elavil 150 mg at bedtime.  Has Valium 5 mg to take every 8 hours if needed for anxiety.  Has Zofran for vomiting and Prilosec for GE reflux 40 mg daily.  History of melanoma followed by Dr. Denna Haggard.  Continues to work out at gym but maybe not as much recently.  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  He has a leg prosthesis.  He has a history of impaired glucose tolerance which is diet controlled.  Review of Systems see above     Objective:   Physical Exam Vital signs reviewed. BMI excellent at 21.63  Chest is clear. Cor RRR without ectopy or murmurs. Affect stable.       Assessment & Plan:  Hyperlipidemia stable on statin  History of depression stable on medication  Impaired glucose tolerance treated with diet  Plan: Continue current medications.  Continue counseling.  Return in 6 months

## 2021-09-17 NOTE — Patient Instructions (Addendum)
It was a pleasure to see you today.  Labs are stable.  Continue current medications.  Continue diet and exercise regimen.  Health maintenance exam and fasting labs are due in 6 months.  No change in medications.

## 2021-09-18 DIAGNOSIS — F331 Major depressive disorder, recurrent, moderate: Secondary | ICD-10-CM | POA: Diagnosis not present

## 2021-09-18 DIAGNOSIS — F411 Generalized anxiety disorder: Secondary | ICD-10-CM | POA: Diagnosis not present

## 2021-10-20 DIAGNOSIS — F331 Major depressive disorder, recurrent, moderate: Secondary | ICD-10-CM | POA: Diagnosis not present

## 2021-10-20 DIAGNOSIS — F411 Generalized anxiety disorder: Secondary | ICD-10-CM | POA: Diagnosis not present

## 2021-11-02 ENCOUNTER — Encounter (HOSPITAL_COMMUNITY): Payer: Self-pay

## 2021-11-02 ENCOUNTER — Emergency Department (HOSPITAL_COMMUNITY)
Admission: EM | Admit: 2021-11-02 | Discharge: 2021-11-02 | Disposition: A | Discharge status: Home / Self Care | Payer: Medicare HMO | Attending: Emergency Medicine | Admitting: Emergency Medicine

## 2021-11-02 ENCOUNTER — Other Ambulatory Visit: Payer: Self-pay

## 2021-11-02 DIAGNOSIS — R6883 Chills (without fever): Secondary | ICD-10-CM | POA: Insufficient documentation

## 2021-11-02 DIAGNOSIS — R112 Nausea with vomiting, unspecified: Secondary | ICD-10-CM

## 2021-11-02 DIAGNOSIS — R109 Unspecified abdominal pain: Secondary | ICD-10-CM | POA: Diagnosis not present

## 2021-11-02 DIAGNOSIS — R9431 Abnormal electrocardiogram [ECG] [EKG]: Secondary | ICD-10-CM | POA: Diagnosis not present

## 2021-11-02 LAB — COMPREHENSIVE METABOLIC PANEL
ALT: 19 U/L (ref 0–44)
AST: 25 U/L (ref 15–41)
Albumin: 4.7 g/dL (ref 3.5–5.0)
Alkaline Phosphatase: 69 U/L (ref 38–126)
Anion gap: 17 — ABNORMAL HIGH (ref 5–15)
BUN: 27 mg/dL — ABNORMAL HIGH (ref 6–20)
CO2: 24 mmol/L (ref 22–32)
Calcium: 10.2 mg/dL (ref 8.9–10.3)
Chloride: 103 mmol/L (ref 98–111)
Creatinine, Ser: 1.19 mg/dL (ref 0.61–1.24)
GFR, Estimated: >60 mL/min (ref >60)
Glucose, Bld: 171 mg/dL — ABNORMAL HIGH (ref 70–99)
Potassium: 3.4 mmol/L — ABNORMAL LOW (ref 3.5–5.1)
Sodium: 144 mmol/L (ref 135–145)
Total Bilirubin: 0.9 mg/dL (ref 0.3–1.2)
Total Protein: 8 g/dL (ref 6.5–8.1)

## 2021-11-02 LAB — CBC
HCT: 45.8 % (ref 39.0–52.0)
Hemoglobin: 15.8 g/dL (ref 13.0–17.0)
MCH: 31.4 pg (ref 26.0–34.0)
MCHC: 34.5 g/dL (ref 30.0–36.0)
MCV: 91.1 fL (ref 80.0–100.0)
Platelets: 292 10*3/uL (ref 150–400)
RBC: 5.03 MIL/uL (ref 4.22–5.81)
RDW: 12.9 % (ref 11.5–15.5)
WBC: 12.1 10*3/uL — ABNORMAL HIGH (ref 4.0–10.5)
nRBC: 0 % (ref 0.0–0.2)

## 2021-11-02 LAB — URINALYSIS, ROUTINE W REFLEX MICROSCOPIC
Bilirubin Urine: NEGATIVE
Glucose, UA: NEGATIVE mg/dL
Hgb urine dipstick: NEGATIVE
Ketones, ur: 80 mg/dL — AB
Leukocytes,Ua: NEGATIVE
Nitrite: NEGATIVE
Protein, ur: 100 mg/dL — AB
Specific Gravity, Urine: 1.034 — ABNORMAL HIGH (ref 1.005–1.030)
pH: 6 (ref 5.0–8.0)

## 2021-11-02 LAB — LIPASE, BLOOD: Lipase: 26 U/L (ref 11–51)

## 2021-11-02 MED ORDER — SODIUM CHLORIDE 0.9 % IV SOLN
25.0000 mg | Freq: Once | INTRAVENOUS | Status: AC
  Administered 2021-11-02: 25 mg via INTRAVENOUS
  Filled 2021-11-02: qty 25

## 2021-11-02 MED ORDER — FAMOTIDINE IN NACL 20-0.9 MG/50ML-% IV SOLN
20.0000 mg | Freq: Once | INTRAVENOUS | Status: AC
  Administered 2021-11-02: 20 mg via INTRAVENOUS
  Filled 2021-11-02: qty 50

## 2021-11-02 MED ORDER — HALOPERIDOL LACTATE 5 MG/ML IJ SOLN
1.0000 mg | Freq: Once | INTRAMUSCULAR | Status: AC
  Administered 2021-11-02: 1 mg via INTRAVENOUS
  Filled 2021-11-02: qty 1

## 2021-11-02 MED ORDER — POTASSIUM CHLORIDE CRYS ER 20 MEQ PO TBCR
40.0000 meq | EXTENDED_RELEASE_TABLET | Freq: Once | ORAL | Status: AC
  Administered 2021-11-02: 40 meq via ORAL
  Filled 2021-11-02: qty 2

## 2021-11-02 MED ORDER — SODIUM CHLORIDE 0.9 % IV BOLUS
1000.0000 mL | Freq: Once | INTRAVENOUS | Status: AC
  Administered 2021-11-02: 1000 mL via INTRAVENOUS

## 2021-11-02 MED ORDER — PROMETHAZINE HCL 25 MG PO TABS: 25.0000 mg | ORAL_TABLET | Freq: Four times a day (QID) | ORAL | 0 refills | Status: DC | PRN

## 2021-11-02 NOTE — ED Provider Notes (Signed)
[facility] DEPT Provider Note   CSN: [id number] Arrival date & time: 11/02/21  1737     History  Chief Complaint  Patient presents with   Emesis    Benjamin Valdez is a 61 y.o. male hypertension, status post appendectomy, status post left BKA, gastritis, major depressive disorder, anxiety.    Presents to the emergency department with a chief complaint of nausea and vomiting.  Patient reports that his symptoms started 4 to 5 hours ago.  Patient states that he has vomited 10-12 times since then.  Patient reports that emesis began as stomach contents and then transition to becoming bilious.  Patient states he is now dry heaving.  Patient reports taking ODT Zofran 3 hours ago with no resolution of his symptoms.  Patient also reports that he is having abdominal pain.  States that abdominal pain began after multiple episodes of nausea and vomiting.  Pain is located throughout his entire abdomen.  Patient denies any fever, constipation, diarrhea, blood in stool, melena, dysuria, hematuria, urinary urgency, urinary frequency, swelling or tenderness genitals, genital sores or lesions.  Patient endorses occasional marijuana use stating that he smoked earlier this week.  Patient endorses social drinking.   Emesis Associated symptoms: abdominal pain and chills   Associated symptoms: no diarrhea, no fever and no headaches        Home Medications Prior to Admission medications   Medication Sig Start Date End Date Taking? Authorizing Provider  amitriptyline (ELAVIL) 150 MG tablet Take by mouth at bedtime. 08/15/21   [provider]  diazepam (VALIUM) 5 MG tablet TAKE 1 TABLET BY MOUTH EVERY 8 HOURS AS NEEDED FOR ANXIETY Patient taking differently: Take 5 mg by mouth daily as needed for anxiety. 04/14/17   Elby Showers, MD  Melatonin 10 MG TABS Take 10 mg by mouth at bedtime.  Patient not taking: Reported on 09/11/2021    [provider]   omeprazole (PRILOSEC) 40 MG capsule TAKE 1 CAPSULE BY MOUTH EVERY DAY Patient taking differently: Take 40 mg by mouth daily. 12/12/20   Elby Showers, MD  ondansetron (ZOFRAN-ODT) 8 MG disintegrating tablet Take 1 tablet (8 mg total) by mouth every 8 (eight) hours as needed for nausea or vomiting. 04/23/21   Dorie Rank, MD  PARoxetine (PAXIL) 40 MG tablet Take 40 mg by mouth daily. 02/13/20   [provider]  rosuvastatin (CRESTOR) 20 MG tablet Take 1 tablet (20 mg total) by mouth daily. 12/19/20   Elby Showers, MD      Allergies    Patient has no known allergies.    Review of Systems   Review of Systems  Constitutional:  Positive for chills. Negative for fever.  Respiratory:  Negative for shortness of breath.   Cardiovascular:  Negative for chest pain.  Gastrointestinal:  Positive for abdominal pain, nausea and vomiting. Negative for abdominal distention, anal bleeding, blood in stool, constipation, diarrhea and rectal pain.  Genitourinary:  Negative for difficulty urinating, dysuria, flank pain, frequency, genital sores, hematuria, penile discharge, penile pain, penile swelling, scrotal swelling, testicular pain and urgency.  Musculoskeletal:  Negative for back pain and neck pain.  Skin:  Negative for color change and rash.  Neurological:  Negative for dizziness, syncope, light-headedness and headaches.  Psychiatric/Behavioral:  Negative for confusion.     Physical Exam Updated Vital Signs BP (!) 171/103 (BP Location: Left Arm)   Pulse 69   Temp 98.2 F (36.8 C) (Oral)   Resp 18  Ht _0  (1.854 m)   Wt 70.3 kg   SpO2 100%   BMI 20.45 kg/m  Physical Exam Vitals and nursing note reviewed.  Constitutional:      General: He is not in acute distress.    Appearance: He is not ill-appearing, toxic-appearing or diaphoretic.  HENT:     Head: Normocephalic.  Eyes:     General: No scleral icterus.       Right eye: No discharge.        Left eye: No discharge.   Cardiovascular:     Rate and Rhythm: Normal rate.  Pulmonary:     Effort: Pulmonary effort is normal. No tachypnea, bradypnea or respiratory distress.     Breath sounds: Normal breath sounds. No stridor.  Abdominal:     General: Abdomen is flat. Bowel sounds are normal. There is no distension. There are no signs of injury.     Palpations: Abdomen is soft. There is no mass or pulsatile mass.     Tenderness: There is abdominal tenderness. There is no right CVA tenderness, left CVA tenderness, guarding or rebound.     Hernia: There is no hernia in the umbilical area or ventral area.     Comments: Diffuse tenderness throughout entire abdomen with increased pain to epigastric region  Skin:    General: Skin is warm and dry.  Neurological:     General: No focal deficit present.     Mental Status: He is alert.  Psychiatric:        Behavior: Behavior is cooperative.     ED Results / Procedures / Treatments   Labs (all labs ordered are listed, but only abnormal results are displayed) Labs Reviewed  CBC - Abnormal; Notable for the following components:      Result Value   WBC 12.1 (*)    All other components within normal limits  LIPASE, BLOOD  COMPREHENSIVE METABOLIC PANEL  URINALYSIS, ROUTINE W REFLEX MICROSCOPIC    EKG None  Radiology No results found.  Procedures Procedures    Medications Ordered in ED Medications - No data to display  ED Course/ Medical Decision Making/ A&P                           Medical Decision Making Amount and/or Complexity of Data Reviewed Labs: ordered.  Risk Prescription drug management.   Alert 61 year old male in no acute distress, nontoxic-appearing.  Presents to the ED with a chief complaint of nausea and vomiting.  Information obtained from patient.  I reviewed patient's past medical records including previous provider notes, labs, and imaging.  Patient has medical history as outlined in HPI was complicates care.  Per chart  review patient was seen 04/2021 for nausea and vomiting.  Patient had resolution of symptoms after receiving Reglan, Haldol, and Zofran.  EKG obtained to evaluate for QTc.  I personally viewed interpret patient's EKG.  Tracing shows sinus rhythm, QTc 474.   Will give patient dose of Haldol, 1 L fluid bolus, Pepcid.  Will obtain CMP, CBC, lipase, and UA.    I personally viewed interpret patient's lab results.  Pertinent findings include: -CBC shows slight leukocytosis at 12.1 -Lipase within normal limits -BUN 27 -AST, ALT, alk phos all within normal limits.  With AST, ALT, alk phos, total bili, and lipase within normal limits low suspicion for hepatobiliary disease or acute pancreatitis at this time.  Plan to reassess patient to check for improvement in  his nausea, vomiting, and abdominal pain.  If symptoms are improved and patient can tolerate p.o. intake we will plan to discharge.  Patient care transferred to [place] at the end of my shift. Patient presentation, ED course, and plan of care discussed with review of all pertinent labs and imaging. Please see his/her note for further details regarding further ED course and disposition.         Final Clinical Impression(s) / ED Diagnoses Final diagnoses:  None    Rx / DC Orders ED Discharge Orders     None         Dyann Ruddle 11/02/21 1940    Luna Fuse, MD 11/02/21 2134

## 2021-11-02 NOTE — ED Triage Notes (Signed)
Pt reports N/V that began about 4 hours ago. Pt also endorses some abdominal pain. Pt reports hx of cyclic vomiting syndrome. Pt reports taking Zofran ODT without relief.

## 2021-11-02 NOTE — ED Provider Triage Note (Signed)
Emergency Medicine Provider Triage Evaluation Note  Benjamin Valdez , a 61 y.o. male  was evaluated in triage.  Pt complains of Nausea, vomiting on 4 hours ago.  Patient with history of cyclic vomiting syndrome.  He tried Zofran ODT without relief.  He endorses some burning in his throat from acidity, as well as severe abdominal pain.  He reports that he has not had the symptoms in a long time but this feels like a very bad episode.  He thinks that he needs some fluids and some IV nausea medication to feel better..  Review of Systems  Positive: Abdominal pain, nausea, vomiting Negative: Chest pain  Physical Exam  BP (!) 171/103 (BP Location: Left Arm)   Pulse 69   Temp 98.2 F (36.8 C) (Oral)   Resp 18   Ht '6\' 1"'$  (1.854 m)   Wt 70.3 kg   SpO2 100%   BMI 20.45 kg/m  Gen:   Awake, actively vomiting   Resp:  Increased effort 2/2 vomiting MSK:   Moves extremities without difficulty  Other:  Ttp epigastric region  Medical Decision Making  Medically screening exam initiated at 6:12 PM.  Appropriate orders placed.  Ireoluwa Grant was informed that the remainder of the evaluation will be completed by another provider, this initial triage assessment does not replace that evaluation, and the importance of remaining in the ED until their evaluation is complete.  Workup initiated   Anselmo Pickler, [place] 11/02/21 1813

## 2021-11-02 NOTE — ED Provider Notes (Signed)
Patient signed out to me is pending repeat evaluation.  After medications and treatment feeling much better no longer vomiting.  Will be discharged home in stable condition.  Given a prescription of Phenergan to go home with.  Advised return for worsening symptoms or any additional concerns.   Luna Fuse, MD 11/02/21 2131

## 2021-11-02 NOTE — Discharge Instructions (Addendum)
Call your primary care doctor or specialist as discussed in the next 2-3 days.   Return immediately back to the ER if:  Your symptoms worsen within the next 12-24 hours. You develop new symptoms such as new fevers, persistent vomiting, new pain, shortness of breath, or new weakness or numbness, or if you have any other concerns.  

## 2021-11-05 DIAGNOSIS — F331 Major depressive disorder, recurrent, moderate: Secondary | ICD-10-CM | POA: Diagnosis not present

## 2021-11-05 DIAGNOSIS — F411 Generalized anxiety disorder: Secondary | ICD-10-CM | POA: Diagnosis not present

## 2021-11-20 ENCOUNTER — Other Ambulatory Visit: Payer: Self-pay | Admitting: Internal Medicine

## 2021-11-20 DIAGNOSIS — F411 Generalized anxiety disorder: Secondary | ICD-10-CM | POA: Diagnosis not present

## 2021-11-20 DIAGNOSIS — F331 Major depressive disorder, recurrent, moderate: Secondary | ICD-10-CM | POA: Diagnosis not present

## 2021-12-23 DIAGNOSIS — F411 Generalized anxiety disorder: Secondary | ICD-10-CM | POA: Diagnosis not present

## 2021-12-23 DIAGNOSIS — F331 Major depressive disorder, recurrent, moderate: Secondary | ICD-10-CM | POA: Diagnosis not present

## 2022-01-06 DIAGNOSIS — F411 Generalized anxiety disorder: Secondary | ICD-10-CM | POA: Diagnosis not present

## 2022-01-06 DIAGNOSIS — F331 Major depressive disorder, recurrent, moderate: Secondary | ICD-10-CM | POA: Diagnosis not present

## 2022-02-04 DIAGNOSIS — Z79899 Other long term (current) drug therapy: Secondary | ICD-10-CM | POA: Diagnosis not present

## 2022-02-04 DIAGNOSIS — F3341 Major depressive disorder, recurrent, in partial remission: Secondary | ICD-10-CM | POA: Diagnosis not present

## 2022-02-04 DIAGNOSIS — F411 Generalized anxiety disorder: Secondary | ICD-10-CM | POA: Diagnosis not present

## 2022-02-16 DIAGNOSIS — F411 Generalized anxiety disorder: Secondary | ICD-10-CM | POA: Diagnosis not present

## 2022-02-16 DIAGNOSIS — F331 Major depressive disorder, recurrent, moderate: Secondary | ICD-10-CM | POA: Diagnosis not present

## 2022-02-18 ENCOUNTER — Other Ambulatory Visit: Payer: Self-pay | Admitting: Internal Medicine

## 2022-03-02 DIAGNOSIS — F411 Generalized anxiety disorder: Secondary | ICD-10-CM | POA: Diagnosis not present

## 2022-03-02 DIAGNOSIS — F331 Major depressive disorder, recurrent, moderate: Secondary | ICD-10-CM | POA: Diagnosis not present

## 2022-03-23 DIAGNOSIS — F411 Generalized anxiety disorder: Secondary | ICD-10-CM | POA: Diagnosis not present

## 2022-03-23 DIAGNOSIS — F331 Major depressive disorder, recurrent, moderate: Secondary | ICD-10-CM | POA: Diagnosis not present

## 2022-04-02 ENCOUNTER — Other Ambulatory Visit: Payer: Medicare HMO

## 2022-04-02 DIAGNOSIS — Z125 Encounter for screening for malignant neoplasm of prostate: Secondary | ICD-10-CM

## 2022-04-02 DIAGNOSIS — F419 Anxiety disorder, unspecified: Secondary | ICD-10-CM

## 2022-04-02 DIAGNOSIS — Z87898 Personal history of other specified conditions: Secondary | ICD-10-CM

## 2022-04-02 DIAGNOSIS — E7800 Pure hypercholesterolemia, unspecified: Secondary | ICD-10-CM

## 2022-04-07 ENCOUNTER — Encounter: Payer: Self-pay | Admitting: Internal Medicine

## 2022-04-07 ENCOUNTER — Ambulatory Visit (INDEPENDENT_AMBULATORY_CARE_PROVIDER_SITE_OTHER): Payer: Medicare HMO | Admitting: Internal Medicine

## 2022-04-07 VITALS — BP 130/84 | HR 81 | Temp 98.1°F | Ht 71.0 in | Wt 169.8 lb

## 2022-04-07 DIAGNOSIS — Z125 Encounter for screening for malignant neoplasm of prostate: Secondary | ICD-10-CM | POA: Diagnosis not present

## 2022-04-07 DIAGNOSIS — E7800 Pure hypercholesterolemia, unspecified: Secondary | ICD-10-CM

## 2022-04-07 DIAGNOSIS — Z85820 Personal history of malignant melanoma of skin: Secondary | ICD-10-CM

## 2022-04-07 DIAGNOSIS — Z23 Encounter for immunization: Secondary | ICD-10-CM

## 2022-04-07 DIAGNOSIS — E7439 Other disorders of intestinal carbohydrate absorption: Secondary | ICD-10-CM | POA: Diagnosis not present

## 2022-04-07 DIAGNOSIS — Z89512 Acquired absence of left leg below knee: Secondary | ICD-10-CM

## 2022-04-07 DIAGNOSIS — Z87898 Personal history of other specified conditions: Secondary | ICD-10-CM | POA: Diagnosis not present

## 2022-04-07 DIAGNOSIS — F419 Anxiety disorder, unspecified: Secondary | ICD-10-CM | POA: Diagnosis not present

## 2022-04-07 DIAGNOSIS — F32A Depression, unspecified: Secondary | ICD-10-CM

## 2022-04-07 DIAGNOSIS — R1115 Cyclical vomiting syndrome unrelated to migraine: Secondary | ICD-10-CM

## 2022-04-07 DIAGNOSIS — Z0000 Encounter for general adult medical examination without abnormal findings: Secondary | ICD-10-CM

## 2022-04-07 LAB — POCT URINALYSIS DIPSTICK
Bilirubin, UA: NEGATIVE
Blood, UA: NEGATIVE
Glucose, UA: NEGATIVE
Ketones, UA: POSITIVE
Leukocytes, UA: NEGATIVE
Nitrite, UA: NEGATIVE
Protein, UA: NEGATIVE
Spec Grav, UA: 1.015
Urobilinogen, UA: 0.2 U/dL
pH, UA: 5

## 2022-04-07 NOTE — Progress Notes (Signed)
Annual Wellness Visit     Patient: Benjamin Valdez, Male    DOB: February 18, 1961, 61 y.o.   MRN: [id number] Visit Date: 04/07/2022   Subjective    Adony Burningham is a 61 y.o. male who presents today for his Annual Wellness Visit.  HPI 61 year old Male also seen  today for health maintenance exam and evaluation of medical issues.No new complaints. He has longstanding history of depression seen by Dr. Terrilyn Saver and Gardiner Sleeper.He has a history of cyclical vomiting and last presented to ED for this July 2023.  History of melanoma in situ of left lower back previously seen by Dr. Denna Haggard but he is looking now for new dermatologist since Dr. Denna Haggard has left the area.  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  He has a leg prosthesis.  He has hyperlipidemia and is on statin medication.  His parents are deceased.  He stayed with them for a long time and took care of them.  He now resides alone.  He works out at [employer] but not as much as he used to.  He has a history of impaired glucose intolerance which is diet-controlled.  Remote history of fractured clavicle.  History of MRI of the left shoulder in 2012 showing a probable labral tear.  He had a normal nuclear stress test in 2009.  Had colonoscopy by Dr. Watt Climes in 2014 and 2 polyps were removed.  He is due to come back soon for repeat study.  Social history: Never married.  Does not smoke.  No alcohol consumption.  4-year college degree.  He receives disability benefits.  Family history: 1 brother with history of Crohn's disease.  Mother had osteoporosis, hyperlipidemia, atrial fibrillation, history of MI, mitral regurgitation and stent placement along with diabetes mellitus.  She was in her 90s when she passed away.  Father was in his late 43s with history of chronic back pain from lumbar stenosis, bladder cancer, diabetes mellitus, coronary stent placement, hyperlipidemia, hypertension, COPD and obstructive sleep  apnea.  1 sister age 12 in good health.          Review of Systems no new complaints- feels well in general   Objective    Vitals: Blood pressure 130/84, pulse 81, regular pulse oximetry 95%, weight 169 pounds 12.8 ounces BMI 23.68 height 5 feet 11 inches.  Physical Exam Skin: Warm and dry.  No suspicious lesions noted.  No cervical adenopathy.  No thyromegaly.  Chest clear.  Cardiac exam: Regular rate and rhythm without ectopy.  Abdomen is soft nondistended without hepatosplenomegaly masses or tenderness.  Prostate is normal without nodules.  Left BKA noted.  Brief neurological exam is intact without gross focal deficits.  Affect thought and judgment appear to be normal.   Most recent functional status assessment:    04/07/2022    2:59 PM  In your present state of health, do you have any difficulty performing the following activities:  Hearing? 0  Vision? 0  Difficulty concentrating or making decisions? 0  Walking or climbing stairs? 1  Dressing or bathing? 0  Doing errands, shopping? 0  Preparing Food and eating ? N  Using the Toilet? N  In the past six months, have you accidently leaked urine? N  Do you have problems with loss of bowel control? N  Managing your Medications? N  Managing your Finances? N  Housekeeping or managing your Housekeeping? N   Most recent fall risk assessment:  04/07/2022    2:58 PM  Fall Risk   Falls in the past year? 1  Number falls in past yr: 0  Injury with Fall? 1  Risk for fall due to : No Fall Risks  Follow up Falls prevention discussed    Most recent depression screenings:    04/07/2022    2:58 PM 02/28/2021    2:12 PM  PHQ 2/9 Scores  PHQ - 2 Score 0 5  PHQ- 9 Score  14   Most recent cognitive screening:    04/07/2022    3:01 PM  6CIT Screen  What Year? 0 points  What month? 0 points  What time? 0 points  Count back from 20 0 points  Months in reverse 0 points  Repeat phrase 0 points  Total Score 0 points        Assessment & Plan   Remote history of left BKA due to sarcoma 1998.  Has a leg prosthesis and is seen annually at [facility].  History of depression  History of cyclical vomiting  Remote history of fractured clavicle  History of in situ melanoma of the lower back and needs to find a new dermatologist for regular body checks apparently the melanoma arose from a dysplastic nevus.  Plan: Flu vaccine given.  Continue current medications and follow-up in 1 year or as needed.  He has an elevated LDL of 126 and previously this was 108 in May 2023.  Has not been eating as healthy recently as he usually does.  Suggested he follow-up with lipid panel in 3 months.  PSA is normal.  Liver functions and creatinine are normal.      Annual wellness visit done today including the all of the following: Reviewed patient's Family Medical History Reviewed and updated list of patient's medical providers Assessment of cognitive impairment was done Assessed patient's functional ability Established a written schedule for health screening [place] Completed and Reviewed  Discussed health benefits of physical activity, and encouraged him to engage in regular exercise appropriate for his age and condition.         {I, Elby Showers, MD, have reviewed all documentation for this visit. The documentation on 05/16/22 for the exam, diagnosis, procedures, and orders are all accurate and complete.   LaVon Barron Alvine, CMA

## 2022-04-08 LAB — CBC WITH DIFFERENTIAL/PLATELET
Absolute Monocytes: 859 cells/uL (ref 200–950)
Basophils Absolute: 61 cells/uL (ref 0–200)
Basophils Relative: 0.6 %
Eosinophils Absolute: 71 cells/uL (ref 15–500)
Eosinophils Relative: 0.7 %
HCT: 45.7 % (ref 38.5–50.0)
Hemoglobin: 15.6 g/dL (ref 13.2–17.1)
Lymphs Abs: 1293 cells/uL (ref 850–3900)
MCH: 31.2 pg (ref 27.0–33.0)
MCHC: 34.1 g/dL (ref 32.0–36.0)
MCV: 91.4 fL (ref 80.0–100.0)
MPV: 10.6 fL (ref 7.5–12.5)
Monocytes Relative: 8.5 %
Neutro Abs: 7817 cells/uL — ABNORMAL HIGH (ref 1500–7800)
Neutrophils Relative %: 77.4 %
Platelets: 273 10*3/uL (ref 140–400)
RBC: 5 10*6/uL (ref 4.20–5.80)
RDW: 12.3 % (ref 11.0–15.0)
Total Lymphocyte: 12.8 %
WBC: 10.1 10*3/uL (ref 3.8–10.8)

## 2022-04-08 LAB — LIPID PANEL
Cholesterol: 213 mg/dL — ABNORMAL HIGH (ref <200)
HDL: 67 mg/dL (ref >=40)
LDL Cholesterol (Calc): 126 mg/dL (calc) — ABNORMAL HIGH
Non-HDL Cholesterol (Calc): 146 mg/dL (calc) — ABNORMAL HIGH (ref <130)
Total CHOL/HDL Ratio: 3.2 (calc) (ref <5.0)
Triglycerides: 97 mg/dL (ref <150)

## 2022-04-08 LAB — COMPLETE METABOLIC PANEL WITH GFR
AG Ratio: 1.9 (calc) (ref 1.0–2.5)
ALT: 13 U/L (ref 9–46)
AST: 18 U/L (ref 10–35)
Albumin: 5 g/dL (ref 3.6–5.1)
Alkaline phosphatase (APISO): 81 U/L (ref 35–144)
BUN/Creatinine Ratio: 25 (calc) — ABNORMAL HIGH (ref 6–22)
BUN: 29 mg/dL — ABNORMAL HIGH (ref 7–25)
CO2: 25 mmol/L (ref 20–32)
Calcium: 10.2 mg/dL (ref 8.6–10.3)
Chloride: 103 mmol/L (ref 98–110)
Creat: 1.18 mg/dL (ref 0.70–1.35)
Globulin: 2.7 g/dL (calc) (ref 1.9–3.7)
Glucose, Bld: 94 mg/dL (ref 65–99)
Potassium: 4.2 mmol/L (ref 3.5–5.3)
Sodium: 142 mmol/L (ref 135–146)
Total Bilirubin: 0.4 mg/dL (ref 0.2–1.2)
Total Protein: 7.7 g/dL (ref 6.1–8.1)
eGFR: 71 mL/min/{1.73_m2} (ref >=60)

## 2022-04-08 LAB — HEMOGLOBIN A1C
Hgb A1c MFr Bld: 6.2 % of total Hgb — ABNORMAL HIGH (ref <5.7)
Mean Plasma Glucose: 131 mg/dL
eAG (mmol/L): 7.3 mmol/L

## 2022-04-08 LAB — PSA: PSA: 1.31 ng/mL (ref <=4.00)

## 2022-04-15 ENCOUNTER — Other Ambulatory Visit: Payer: Self-pay

## 2022-04-15 MED ORDER — METFORMIN HCL 500 MG PO TABS: 500.0000 mg | ORAL_TABLET | Freq: Two times a day (BID) | ORAL | 1 refills | Status: DC

## 2022-04-16 DIAGNOSIS — F411 Generalized anxiety disorder: Secondary | ICD-10-CM | POA: Diagnosis not present

## 2022-04-16 DIAGNOSIS — F331 Major depressive disorder, recurrent, moderate: Secondary | ICD-10-CM | POA: Diagnosis not present

## 2022-05-05 DIAGNOSIS — F411 Generalized anxiety disorder: Secondary | ICD-10-CM | POA: Diagnosis not present

## 2022-05-05 DIAGNOSIS — F331 Major depressive disorder, recurrent, moderate: Secondary | ICD-10-CM | POA: Diagnosis not present

## 2022-05-19 DIAGNOSIS — F411 Generalized anxiety disorder: Secondary | ICD-10-CM | POA: Diagnosis not present

## 2022-05-19 DIAGNOSIS — F331 Major depressive disorder, recurrent, moderate: Secondary | ICD-10-CM | POA: Diagnosis not present

## 2022-05-28 DIAGNOSIS — Z89512 Acquired absence of left leg below knee: Secondary | ICD-10-CM | POA: Diagnosis not present

## 2022-06-08 DIAGNOSIS — F331 Major depressive disorder, recurrent, moderate: Secondary | ICD-10-CM | POA: Diagnosis not present

## 2022-06-08 DIAGNOSIS — F411 Generalized anxiety disorder: Secondary | ICD-10-CM | POA: Diagnosis not present

## 2022-06-08 DIAGNOSIS — Z79899 Other long term (current) drug therapy: Secondary | ICD-10-CM | POA: Diagnosis not present

## 2022-06-09 DIAGNOSIS — F411 Generalized anxiety disorder: Secondary | ICD-10-CM | POA: Diagnosis not present

## 2022-06-09 DIAGNOSIS — F331 Major depressive disorder, recurrent, moderate: Secondary | ICD-10-CM | POA: Diagnosis not present

## 2022-06-14 NOTE — Patient Instructions (Addendum)
It was a pleasure to see you today.  Continue follow-up with Dr. Reece Levy and his associates.  Flu vaccine given today.  Watch diet a bit.  Would like to repeat lipid panel in 3 months when a bit more active and watching diet carefully.  Colonoscopy is due.  Please call gastroenterology to make an appointment.  Will repeat hemoglobin A1c when you return in 3 months.

## 2022-06-25 DIAGNOSIS — F411 Generalized anxiety disorder: Secondary | ICD-10-CM | POA: Diagnosis not present

## 2022-06-25 DIAGNOSIS — F331 Major depressive disorder, recurrent, moderate: Secondary | ICD-10-CM | POA: Diagnosis not present

## 2022-06-29 ENCOUNTER — Ambulatory Visit (INDEPENDENT_AMBULATORY_CARE_PROVIDER_SITE_OTHER): Payer: Medicare HMO | Admitting: Internal Medicine

## 2022-06-29 ENCOUNTER — Other Ambulatory Visit: Payer: Medicare HMO

## 2022-06-29 ENCOUNTER — Encounter: Payer: Self-pay | Admitting: Internal Medicine

## 2022-06-29 ENCOUNTER — Telehealth: Payer: Self-pay | Admitting: Internal Medicine

## 2022-06-29 VITALS — BP 130/80 | HR 81 | Temp 99.1°F | Ht 71.0 in | Wt 179.8 lb

## 2022-06-29 DIAGNOSIS — Z89512 Acquired absence of left leg below knee: Secondary | ICD-10-CM | POA: Diagnosis not present

## 2022-06-29 DIAGNOSIS — S81802A Unspecified open wound, left lower leg, initial encounter: Secondary | ICD-10-CM

## 2022-06-29 DIAGNOSIS — L02436 Carbuncle of left lower limb: Secondary | ICD-10-CM | POA: Diagnosis not present

## 2022-06-29 MED ORDER — DOXYCYCLINE HYCLATE 100 MG PO TABS: 100.0000 mg | ORAL_TABLET | Freq: Two times a day (BID) | ORAL | 0 refills | Status: DC

## 2022-06-29 MED ORDER — HYDROCODONE-ACETAMINOPHEN 5-325 MG PO TABS: ORAL_TABLET | ORAL | 0 refills | Status: DC

## 2022-06-29 MED ORDER — CEFTRIAXONE SODIUM 1 G IJ SOLR
1.0000 g | Freq: Once | INTRAMUSCULAR | Status: AC
  Administered 2022-06-29: 1 g via INTRAMUSCULAR

## 2022-06-29 MED ORDER — MUPIROCIN 2 % EX OINT: 1.0000 | TOPICAL_OINTMENT | Freq: Two times a day (BID) | CUTANEOUS | 0 refills | Status: DC

## 2022-06-29 NOTE — Progress Notes (Signed)
Lab only 

## 2022-06-29 NOTE — Telephone Encounter (Signed)
scheduled

## 2022-06-29 NOTE — Progress Notes (Signed)
Patient Care Team: Elby Showers, MD as PCP - General (Internal Medicine) Lavonna Monarch, MD (Inactive) as Consulting Physician (Dermatology)  Visit Date: 06/29/22  Subjective:    Patient ID: Benjamin Valdez , Male   DOB: 30-Jul-1960, 62 y.o.    MRN: [id number]   62 y.o. Male presents today for left residual limb lesion. Patient has a past medical history of cancer, leg amputation.  Notes sore on left lower residual limb that started draining fluid and some tissue on 06/26/22. Has been applying neosporin, alcohol. Denies fever, chills. Tetanus vaccine is up-to-date.   Past Medical History:  Diagnosis Date   Angiosarcoma ([place])    Of the left leg   Atypical mole 07/14/1996   Upper Mid Back (slight)   Atypical nevus 07/14/1996   Mid/Lower Right Back (slight to moderate)   Atypical nevus 12/28/1996   Right Mid Back (slight to moderate)   Atypical nevus 12/28/1996   Mid Lower Back (slight to moderate) Mindi Slicker)   Atypical nevus 10/26/2000   Right Abdomen (moderate)   Atypical nevus 07/27/2003   Left Lower Back (moderate) (widershave)   Atypical nevus 07/27/2003   Left Lower Back (mid) (slight) (widershave)   Atypical nevus 07/27/2003   Right Lower Back (inf) (slight) (widershave)   Atypical nevus 07/27/2003   Right Lower Back (sup) (slight) (widershave)   Atypical nevus 07/27/2003   Right Back (slight)   Atypical nevus 07/27/2003   Right Center Back (sup) (slight)   Atypical nevus 07/27/2003   Right Center Back (inf) (mild)   Atypical nevus 07/27/2003   Right Side (mild)   Atypical nevus 07/27/2003   Right Mid Back (slight) (46m punch widershave)   Atypical nevus 03/10/2005   Left Abdomen (moderate to severe) (Mindi Slicker   Atypical nevus 03/10/2005   Left Upper Back,sup (moderate)   Atypical nevus 03/10/2005   Left Upper Back,medial (moderate) (observe)   Atypical nevus 03/10/2005   Left Upper Back, inf (moderate)   Atypical nevus 04/22/2006   Right Mid  Back (marked) (widershave)   Bone cancer ([place]    has left BK amputation   Chest pain April 2010   Myocardial Infarction ruled out   Dyslipidemia    Essential hypertension    Gastritis April 2010   Acute--Resolved   Hiatal hernia    Marijuana abuse    History of Marijuana use   Melanoma ([place] 10/02/2019   LEFT LOWER BACK MM INSITU   Nausea alone    Recurrent   Neck problem    Vomiting      Family History  Problem Relation Age of Onset   Diabetes Mother    Bladder Cancer Father    Crohn's disease Brother     Social History   Social History Narrative   Not on file      Review of Systems  Constitutional:  Negative for chills, fever and malaise/fatigue.  HENT:  Negative for congestion.   Eyes:  Negative for blurred vision.  Respiratory:  Negative for cough and shortness of breath.   Cardiovascular:  Negative for chest pain, palpitations and leg swelling.  Gastrointestinal:  Negative for vomiting.  Musculoskeletal:  Negative for back pain.  Skin:  Negative for rash.       (+) Sore at base of left residual limb  Neurological:  Negative for loss of consciousness and headaches.   Draining lesion distal end of left BKA, Draining serosanguinous fluid. Sent for Gram stain and culture.     Objective:  Vitals: BP 130/80   Pulse 81   Temp 99.1 F (37.3 C) (Tympanic)   Ht '5\' 11"'$  (1.803 m)   Wt 179 lb 12.8 oz (81.6 kg)   SpO2 98%   BMI 25.08 kg/m    Physical Exam Vitals and nursing note reviewed.  Constitutional:      General: He is not in acute distress.    Appearance: Normal appearance. He is not ill-appearing.  HENT:     Head: Normocephalic and atraumatic.  Pulmonary:     Effort: Pulmonary effort is normal.  Skin:    General: Skin is warm and dry.     Comments: Lesion at base of left leg stump draining serosanguinous fluid.  Neurological:     Mental Status: He is alert and oriented to person, place, and time. Mental status is at baseline.  Psychiatric:         Mood and Affect: Mood normal.        Behavior: Behavior normal.        Thought Content: Thought content normal.        Judgment: Judgment normal.       Results:   Studies obtained and personally reviewed by me:   Labs:       Component Value Date/Time   NA 142 04/07/2022 1543   K 4.2 04/07/2022 1543   CL 103 04/07/2022 1543   CO2 25 04/07/2022 1543   GLUCOSE 94 04/07/2022 1543   BUN 29 (H) 04/07/2022 1543   CREATININE 1.18 04/07/2022 1543   CALCIUM 10.2 04/07/2022 1543   PROT 7.7 04/07/2022 1543   ALBUMIN 4.7 11/02/2021 1823   AST 18 04/07/2022 1543   ALT 13 04/07/2022 1543   ALKPHOS 69 11/02/2021 1823   BILITOT 0.4 04/07/2022 1543   GFRNONAA >60 11/02/2021 1823   GFRNONAA 57 (L) 12/29/2019 0935   GFRAA 67 12/29/2019 0935     Lab Results  Component Value Date   WBC 10.1 04/07/2022   HGB 15.6 04/07/2022   HCT 45.7 04/07/2022   MCV 91.4 04/07/2022   PLT 273 04/07/2022    Lab Results  Component Value Date   CHOL 213 (H) 04/07/2022   HDL 67 04/07/2022   LDLCALC 126 (H) 04/07/2022   TRIG 97 04/07/2022   CHOLHDL 3.2 04/07/2022    Lab Results  Component Value Date   HGBA1C 6.2 (H) 04/07/2022     No results found for: "TSH"   Lab Results  Component Value Date   PSA 1.31 04/07/2022   PSA 1.35 02/25/2021   PSA 0.9 12/29/2019      Assessment & Plan:   Draining lesion-? Carbuncle of left BKA: Soak in warm soapy water for 20 minutes daily, clean with peroxide twice daily, keep covered. Advised to schedule appointment with orthopedist. Administered Rocephin 1 g IM. Prescribed doxycycline 100 mg twice daily for 10 days. Ordered left leg Xray, wound culture, CBC with Diff/Plat. He will be in contact with Orthopedist at [facility].  I,Alexander Ruley,acting as a Education administrator for Elby Showers, MD.,have documented all relevant documentation on the behalf of Elby Showers, MD,as directed by  Elby Showers, MD while in the presence of Elby Showers, MD.   I, Elby Showers, MD, have reviewed all documentation for this visit. The documentation on 07/01/22 for the exam, diagnosis, procedures, and orders are all accurate and complete.

## 2022-06-29 NOTE — Patient Instructions (Addendum)
CBC with diff drawn today, Rocephin one gram IM given in office, Start Doxycycline 100 mg twice daily for 10 days. He will be in touch with his Orthopedist at [facility] regarding this wound. We have attempted to send wound culture and Gram Stain to [facility]. His Tdap is up to date. Clean wound twice daily with peroxide. Do not continue to express material

## 2022-06-29 NOTE — Telephone Encounter (Signed)
Benjamin Valdez [phone]  Jesus Genera called to say he has a sore on the bottom of his stump that is sore and putting out blood that is making it difficult to walk, no odor, he is wanting to know if you would call something in for him and I let him know you would have to see him first. He can come in anytime today.

## 2022-06-30 LAB — CBC WITH DIFFERENTIAL/PLATELET
Absolute Monocytes: 806 cells/uL (ref 200–950)
Basophils Absolute: 48 cells/uL (ref 0–200)
Basophils Relative: 0.5 %
Eosinophils Absolute: 77 cells/uL (ref 15–500)
Eosinophils Relative: 0.8 %
HCT: 42.7 % (ref 38.5–50.0)
Hemoglobin: 14.7 g/dL (ref 13.2–17.1)
Lymphs Abs: 1373 cells/uL (ref 850–3900)
MCH: 31.1 pg (ref 27.0–33.0)
MCHC: 34.4 g/dL (ref 32.0–36.0)
MCV: 90.3 fL (ref 80.0–100.0)
MPV: 11 fL (ref 7.5–12.5)
Monocytes Relative: 8.4 %
Neutro Abs: 7296 cells/uL (ref 1500–7800)
Neutrophils Relative %: 76 %
Platelets: 245 10*3/uL (ref 140–400)
RBC: 4.73 10*6/uL (ref 4.20–5.80)
RDW: 12.5 % (ref 11.0–15.0)
Total Lymphocyte: 14.3 %
WBC: 9.6 10*3/uL (ref 3.8–10.8)

## 2022-07-01 ENCOUNTER — Telehealth: Payer: Self-pay | Admitting: Internal Medicine

## 2022-07-01 ENCOUNTER — Encounter: Payer: Self-pay | Admitting: Internal Medicine

## 2022-07-01 MED ORDER — HYDROGEN PEROXIDE 3 % EX SOLN: Freq: Once | CUTANEOUS | Status: AC

## 2022-07-01 MED ORDER — MUPIROCIN CALCIUM 2 % EX CREA: TOPICAL_CREAM | Freq: Once | CUTANEOUS | Status: AC

## 2022-07-01 NOTE — Addendum Note (Signed)
Addended by: Geradine Girt D on: 07/01/2022 12:34 PM   Modules accepted: Orders

## 2022-07-01 NOTE — Telephone Encounter (Signed)
Phone call to patient. Has scant amount of Staph aureus on wound culture. Susceptibility results to follow. He feels OK. No fever or chills and he thinks leg lesion looks no worse and maybe better. Since this is Staph aureus, he would likely benefit from bathing leg in hebiclens once or twice daily. Leave solution on for 10-15 minutes and then rinse well. Continue current antibiotic pending susceptibility testing.MJB, MD

## 2022-07-02 LAB — WOUND CULTURE
MICRO NUMBER:: 14674938
SPECIMEN QUALITY:: ADEQUATE

## 2022-07-02 NOTE — Progress Notes (Signed)
Patient Care Team: Elby Showers, MD as PCP - General (Internal Medicine) Lavonna Monarch, MD (Inactive) as Consulting Physician (Dermatology)  Visit Date: 07/09/22  Subjective:    Patient ID: Benjamin Valdez , Male   DOB: November 10, 1960, 62 y.o.    MRN: [id number]   62 y.o. Male presents today for a 3 month A1c, cholesterol check. Patient has a past medical history of Type 2 diabetes mellitus, angiosarcoma, bone cancer, chest pain, dyslipidemia, essential hypertension, gastritis, hiatal hernia, melanoma, nausea, neck pain, vomiting.  History of Type 2 diabetes mellitus treated with Glucophage 500 mg twice daily with a meal. HGBA1c at 6.2% on 04/07/22.  Will not change current regimen  History of hyperlipidemia treated with Crestor 20 mg daily. Lipid panel normal on 07/07/22.  Seen by [facility] orthopedics, Dr. Sherlynn Stalls, 07/03/22 for small abscess at distal aspect of left residual limb. Instructed to remain on antibiotics. Has follow-up with them tomorrow. The abscess is not painful.  Recent culture of wound here revealed Staph aureus.  He has been treated with doxycycline.   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   Atypical mole 07/14/1996   Upper Mid Back (slight)   Atypical nevus 07/14/1996   Mid/Lower Right Back (slight to moderate)   Atypical nevus 12/28/1996   Right Mid Back (slight to moderate)   Atypical nevus 12/28/1996   Mid Lower Back (slight to moderate) Mindi Slicker)   Atypical nevus 10/26/2000   Right Abdomen (moderate)   Atypical nevus 07/27/2003   Left Lower Back (moderate) (widershave)   Atypical nevus 07/27/2003   Left Lower Back (mid) (slight) (widershave)   Atypical nevus 07/27/2003   Right Lower Back (inf) (slight) (widershave)   Atypical nevus 07/27/2003   Right Lower Back (sup) (slight) (widershave)   Atypical nevus 07/27/2003   Right Back (slight)   Atypical nevus 07/27/2003   Right Center Back (sup) (slight)    Atypical nevus 07/27/2003   Right Center Back (inf) (mild)   Atypical nevus 07/27/2003   Right Side (mild)   Atypical nevus 07/27/2003   Right Mid Back (slight) (87mm punch widershave)   Atypical nevus 03/10/2005   Left Abdomen (moderate to severe) Mindi Slicker)   Atypical nevus 03/10/2005   Left Upper Back,sup (moderate)   Atypical nevus 03/10/2005   Left Upper Back,medial (moderate) (observe)   Atypical nevus 03/10/2005   Left Upper Back, inf (moderate)   Atypical nevus 04/22/2006   Right Mid Back (marked) (widershave)   Bone cancer ([place])    has left BK amputation   Chest pain April 2010   Myocardial Infarction ruled out   Dyslipidemia    Essential hypertension    Gastritis April 2010   Acute--Resolved   Hiatal hernia    Marijuana abuse    History of Marijuana use   Melanoma ([place]) 10/02/2019   LEFT LOWER BACK MM INSITU   Nausea alone    Recurrent   Neck problem    Vomiting      Family History  Problem Relation Age of Onset   Diabetes Mother    Bladder Cancer Father    Crohn's disease Brother     Social History   Social History Narrative   Not on file      Review of Systems  Constitutional:  Negative for fever and malaise/fatigue.  HENT:  Negative for congestion.   Eyes:  Negative for blurred vision.  Respiratory:  Negative for cough and shortness  of breath.   Cardiovascular:  Negative for chest pain, palpitations and leg swelling.  Gastrointestinal:  Negative for vomiting.  Musculoskeletal:  Negative for back pain.  Skin:  Negative for rash.  Neurological:  Negative for loss of consciousness and headaches.        Objective:   Vitals: BP 120/80   Pulse 92   Temp 98.9 F (37.2 C) (Tympanic)   Ht 5\' 11"  (1.803 m)   Wt 175 lb 12.8 oz (79.7 kg)   SpO2 98%   BMI 24.52 kg/m    Physical Exam Vitals and nursing note reviewed.  Constitutional:      General: He is not in acute distress.    Appearance: Normal appearance. He is not ill-appearing.   HENT:     Head: Normocephalic and atraumatic.  Pulmonary:     Effort: Pulmonary effort is normal. No respiratory distress.     Breath sounds: Normal breath sounds. No wheezing or rales.  Skin:    General: Skin is warm and dry.     Comments: Serosanguinous drainage from abscess at distal aspect of left residual limb.  Neurological:     Mental Status: He is alert and oriented to person, place, and time. Mental status is at baseline.  Psychiatric:        Mood and Affect: Mood normal.        Behavior: Behavior normal.        Thought Content: Thought content normal.        Judgment: Judgment normal.       Results:   Studies obtained and personally reviewed by me:   Labs:       Component Value Date/Time   NA 142 04/07/2022 1543   K 4.2 04/07/2022 1543   CL 103 04/07/2022 1543   CO2 25 04/07/2022 1543   GLUCOSE 94 04/07/2022 1543   BUN 29 (H) 04/07/2022 1543   CREATININE 1.18 04/07/2022 1543   CALCIUM 10.2 04/07/2022 1543   PROT 7.7 04/07/2022 1543   ALBUMIN 4.7 11/02/2021 1823   AST 18 04/07/2022 1543   ALT 13 04/07/2022 1543   ALKPHOS 69 11/02/2021 1823   BILITOT 0.4 04/07/2022 1543   GFRNONAA >60 11/02/2021 1823   GFRNONAA 57 (L) 12/29/2019 0935   GFRAA 67 12/29/2019 0935     Lab Results  Component Value Date   WBC 9.6 06/29/2022   HGB 14.7 06/29/2022   HCT 42.7 06/29/2022   MCV 90.3 06/29/2022   PLT 245 06/29/2022    Lab Results  Component Value Date   CHOL 176 07/07/2022   HDL 58 07/07/2022   LDLCALC 99 07/07/2022   TRIG 97 07/07/2022   CHOLHDL 3.0 07/07/2022    Lab Results  Component Value Date   HGBA1C 6.2 (H) 07/07/2022     No results found for: "TSH"   Lab Results  Component Value Date   PSA 1.31 04/07/2022   PSA 1.35 02/25/2021   PSA 0.9 12/29/2019      Assessment & Plan:   Type 2 diabetes mellitus: treated with Glucophage 500 mg twice daily with a meal. HGBA1c at 6.2% on 04/07/22.  Hyperlipidemia: treated with Crestor 20 mg  daily. Lipid panel normal on 07/07/22.  Small abscess at distal aspect of left residual limb: follow-up with [facility] orthopedics tomorrow. Continue with doxycycline.  History of cyclical vomiting requiring treatment in the emergency department from time to time  Plan: Return in 6 months for health maintenance exam or sooner if needed.  Finish course of antibiotics for BKA infection  I,Benjamin Valdez,acting as a Education administrator for Elby Showers, MD.,have documented all relevant documentation on the behalf of Elby Showers, MD,as directed by  Elby Showers, MD while in the presence of Elby Showers, MD.   I, Elby Showers, MD, have reviewed all documentation for this visit. The documentation on 07/12/22 for the exam, diagnosis, procedures, and orders are all accurate and complete.

## 2022-07-03 DIAGNOSIS — M25469 Effusion, unspecified knee: Secondary | ICD-10-CM | POA: Diagnosis not present

## 2022-07-07 ENCOUNTER — Other Ambulatory Visit: Payer: Self-pay | Admitting: Internal Medicine

## 2022-07-07 ENCOUNTER — Other Ambulatory Visit: Payer: Medicare HMO

## 2022-07-07 ENCOUNTER — Telehealth: Payer: Self-pay | Admitting: Internal Medicine

## 2022-07-07 DIAGNOSIS — R7302 Impaired glucose tolerance (oral): Secondary | ICD-10-CM

## 2022-07-07 DIAGNOSIS — E7800 Pure hypercholesterolemia, unspecified: Secondary | ICD-10-CM | POA: Diagnosis not present

## 2022-07-07 NOTE — Telephone Encounter (Signed)
Benjamin Valdez called to see if we wanted him to still come in for his labs today, since he tested positive for MRSA, or if we needed to re-schedule for later date? He did also say he was going to [facility] later this week and they would be changing the dressing and treating him.

## 2022-07-07 NOTE — Telephone Encounter (Signed)
After talking to Dr Renold Genta she said as long as sore is covered up he would be fine.

## 2022-07-08 LAB — LIPID PANEL
Cholesterol: 176 mg/dL (ref <200)
HDL: 58 mg/dL (ref >=40)
LDL Cholesterol (Calc): 99 mg/dL (calc)
Non-HDL Cholesterol (Calc): 118 mg/dL (calc) (ref <130)
Total CHOL/HDL Ratio: 3 (calc) (ref <5.0)
Triglycerides: 97 mg/dL (ref <150)

## 2022-07-08 LAB — HEMOGLOBIN A1C
Hgb A1c MFr Bld: 6.2 % of total Hgb — ABNORMAL HIGH (ref <5.7)
Mean Plasma Glucose: 131 mg/dL
eAG (mmol/L): 7.3 mmol/L

## 2022-07-09 ENCOUNTER — Encounter: Payer: Self-pay | Admitting: Internal Medicine

## 2022-07-09 ENCOUNTER — Ambulatory Visit (INDEPENDENT_AMBULATORY_CARE_PROVIDER_SITE_OTHER): Payer: Medicare HMO | Admitting: Internal Medicine

## 2022-07-09 VITALS — BP 120/80 | HR 92 | Temp 98.9°F | Ht 71.0 in | Wt 175.8 lb

## 2022-07-09 DIAGNOSIS — E7800 Pure hypercholesterolemia, unspecified: Secondary | ICD-10-CM | POA: Diagnosis not present

## 2022-07-09 DIAGNOSIS — R1115 Cyclical vomiting syndrome unrelated to migraine: Secondary | ICD-10-CM

## 2022-07-09 DIAGNOSIS — Z87898 Personal history of other specified conditions: Secondary | ICD-10-CM | POA: Diagnosis not present

## 2022-07-09 DIAGNOSIS — Z89512 Acquired absence of left leg below knee: Secondary | ICD-10-CM | POA: Diagnosis not present

## 2022-07-09 DIAGNOSIS — F331 Major depressive disorder, recurrent, moderate: Secondary | ICD-10-CM | POA: Diagnosis not present

## 2022-07-09 DIAGNOSIS — F411 Generalized anxiety disorder: Secondary | ICD-10-CM | POA: Diagnosis not present

## 2022-07-09 DIAGNOSIS — T8740 Infection of amputation stump, unspecified extremity: Secondary | ICD-10-CM | POA: Diagnosis not present

## 2022-07-10 DIAGNOSIS — Z89512 Acquired absence of left leg below knee: Secondary | ICD-10-CM | POA: Diagnosis not present

## 2022-07-10 DIAGNOSIS — M898X6 Other specified disorders of bone, lower leg: Secondary | ICD-10-CM | POA: Diagnosis not present

## 2022-07-10 DIAGNOSIS — S88119A Complete traumatic amputation at level between knee and ankle, unspecified lower leg, initial encounter: Secondary | ICD-10-CM | POA: Diagnosis not present

## 2022-07-10 DIAGNOSIS — M7989 Other specified soft tissue disorders: Secondary | ICD-10-CM | POA: Diagnosis not present

## 2022-07-12 ENCOUNTER — Encounter (HOSPITAL_BASED_OUTPATIENT_CLINIC_OR_DEPARTMENT_OTHER): Payer: Self-pay | Admitting: Urology

## 2022-07-12 ENCOUNTER — Emergency Department (HOSPITAL_BASED_OUTPATIENT_CLINIC_OR_DEPARTMENT_OTHER)
Admission: EM | Admit: 2022-07-12 | Discharge: 2022-07-12 | Disposition: A | Discharge status: Home / Self Care | Payer: Medicare HMO | Attending: Emergency Medicine | Admitting: Emergency Medicine

## 2022-07-12 ENCOUNTER — Emergency Department (HOSPITAL_BASED_OUTPATIENT_CLINIC_OR_DEPARTMENT_OTHER): Payer: Medicare HMO

## 2022-07-12 DIAGNOSIS — R1084 Generalized abdominal pain: Secondary | ICD-10-CM | POA: Diagnosis not present

## 2022-07-12 DIAGNOSIS — R112 Nausea with vomiting, unspecified: Secondary | ICD-10-CM | POA: Insufficient documentation

## 2022-07-12 DIAGNOSIS — R109 Unspecified abdominal pain: Secondary | ICD-10-CM | POA: Diagnosis not present

## 2022-07-12 DIAGNOSIS — K760 Fatty (change of) liver, not elsewhere classified: Secondary | ICD-10-CM | POA: Diagnosis not present

## 2022-07-12 LAB — URINALYSIS, ROUTINE W REFLEX MICROSCOPIC
Bilirubin Urine: NEGATIVE
Glucose, UA: NEGATIVE mg/dL
Hgb urine dipstick: NEGATIVE
Ketones, ur: NEGATIVE mg/dL
Leukocytes,Ua: NEGATIVE
Nitrite: NEGATIVE
Protein, ur: NEGATIVE mg/dL
Specific Gravity, Urine: >=1.03 (ref 1.005–1.030)
pH: 5.5 (ref 5.0–8.0)

## 2022-07-12 LAB — CBC WITH DIFFERENTIAL/PLATELET
Abs Immature Granulocytes: 0.02 10*3/uL (ref 0.00–0.07)
Basophils Absolute: 0 10*3/uL (ref 0.0–0.1)
Basophils Relative: 0 %
Eosinophils Absolute: 0 10*3/uL (ref 0.0–0.5)
Eosinophils Relative: 0 %
HCT: 43.8 % (ref 39.0–52.0)
Hemoglobin: 14.9 g/dL (ref 13.0–17.0)
Immature Granulocytes: 0 %
Lymphocytes Relative: 12 %
Lymphs Abs: 1.2 10*3/uL (ref 0.7–4.0)
MCH: 30.6 pg (ref 26.0–34.0)
MCHC: 34 g/dL (ref 30.0–36.0)
MCV: 89.9 fL (ref 80.0–100.0)
Monocytes Absolute: 0.6 10*3/uL (ref 0.1–1.0)
Monocytes Relative: 6 %
Neutro Abs: 7.7 10*3/uL (ref 1.7–7.7)
Neutrophils Relative %: 82 %
Platelets: 273 10*3/uL (ref 150–400)
RBC: 4.87 MIL/uL (ref 4.22–5.81)
RDW: 12.5 % (ref 11.5–15.5)
WBC: 9.6 10*3/uL (ref 4.0–10.5)
nRBC: 0 % (ref 0.0–0.2)

## 2022-07-12 LAB — COMPREHENSIVE METABOLIC PANEL
ALT: 17 U/L (ref 0–44)
AST: 25 U/L (ref 15–41)
Albumin: 4.3 g/dL (ref 3.5–5.0)
Alkaline Phosphatase: 72 U/L (ref 38–126)
Anion gap: 11 (ref 5–15)
BUN: 26 mg/dL — ABNORMAL HIGH (ref 8–23)
CO2: 24 mmol/L (ref 22–32)
Calcium: 9.7 mg/dL (ref 8.9–10.3)
Chloride: 103 mmol/L (ref 98–111)
Creatinine, Ser: 1.2 mg/dL (ref 0.61–1.24)
GFR, Estimated: >60 mL/min (ref >60)
Glucose, Bld: 161 mg/dL — ABNORMAL HIGH (ref 70–99)
Potassium: 3.7 mmol/L (ref 3.5–5.1)
Sodium: 138 mmol/L (ref 135–145)
Total Bilirubin: 0.5 mg/dL (ref 0.3–1.2)
Total Protein: 7.9 g/dL (ref 6.5–8.1)

## 2022-07-12 LAB — TROPONIN I (HIGH SENSITIVITY): Troponin I (High Sensitivity): 2 ng/L (ref <18)

## 2022-07-12 MED ORDER — FENTANYL CITRATE PF 50 MCG/ML IJ SOSY
50.0000 ug | PREFILLED_SYRINGE | Freq: Once | INTRAMUSCULAR | Status: AC
  Administered 2022-07-12: 50 ug via INTRAVENOUS
  Filled 2022-07-12: qty 1

## 2022-07-12 MED ORDER — IOHEXOL 300 MG/ML  SOLN
100.0000 mL | Freq: Once | INTRAMUSCULAR | Status: AC | PRN
  Administered 2022-07-12: 100 mL via INTRAVENOUS

## 2022-07-12 MED ORDER — SODIUM CHLORIDE 0.9 % IV SOLN
12.5000 mg | Freq: Once | INTRAVENOUS | Status: AC
  Administered 2022-07-12: 12.5 mg via INTRAVENOUS
  Filled 2022-07-12: qty 0.5

## 2022-07-12 MED ORDER — ONDANSETRON HCL 4 MG/2ML IJ SOLN
4.0000 mg | Freq: Once | INTRAMUSCULAR | Status: AC
  Administered 2022-07-12: 4 mg via INTRAVENOUS
  Filled 2022-07-12: qty 2

## 2022-07-12 MED ORDER — SODIUM CHLORIDE 0.9 % IV BOLUS
1000.0000 mL | Freq: Once | INTRAVENOUS | Status: AC
  Administered 2022-07-12: 1000 mL via INTRAVENOUS

## 2022-07-12 MED ORDER — PROMETHAZINE HCL 25 MG RE SUPP: 25.0000 mg | Freq: Four times a day (QID) | RECTAL | 0 refills | Status: DC | PRN

## 2022-07-12 MED ORDER — DIPHENHYDRAMINE HCL 50 MG/ML IJ SOLN
25.0000 mg | Freq: Once | INTRAMUSCULAR | Status: AC
  Administered 2022-07-12: 25 mg via INTRAVENOUS
  Filled 2022-07-12: qty 1

## 2022-07-12 MED ORDER — DROPERIDOL 2.5 MG/ML IJ SOLN
5.0000 mg | Freq: Once | INTRAMUSCULAR | Status: AC
  Administered 2022-07-12: 5 mg via INTRAVENOUS
  Filled 2022-07-12: qty 2

## 2022-07-12 MED ORDER — METOCLOPRAMIDE HCL 5 MG/ML IJ SOLN
10.0000 mg | Freq: Once | INTRAMUSCULAR | Status: AC
  Administered 2022-07-12: 10 mg via INTRAVENOUS
  Filled 2022-07-12: qty 2

## 2022-07-12 MED ORDER — PROMETHAZINE HCL 25 MG/ML IJ SOLN
INTRAMUSCULAR | Status: AC
  Filled 2022-07-12: qty 1

## 2022-07-12 MED ORDER — PROMETHAZINE HCL 25 MG PO TABS: 25.0000 mg | ORAL_TABLET | Freq: Four times a day (QID) | ORAL | 0 refills | Status: DC | PRN

## 2022-07-12 MED ORDER — KETOROLAC TROMETHAMINE 15 MG/ML IJ SOLN
15.0000 mg | Freq: Once | INTRAMUSCULAR | Status: AC
  Administered 2022-07-12: 15 mg via INTRAVENOUS
  Filled 2022-07-12: qty 1

## 2022-07-12 NOTE — Patient Instructions (Addendum)
Finish course of oral antibiotics for distal leg wound.  Wound is looking better.  Lipid panel is stable on rosuvastatin.  Health maintenance exam scheduled for December 2024.  Notes from reviewed from [facility] orthopedist ,Dr. Sherlynn Stalls  Vaccines reviewed  Hemoglobin A1c stable at 6.2% on current medications

## 2022-07-12 NOTE — ED Notes (Signed)
Pt provided ginger ale for PO challenge  

## 2022-07-12 NOTE — Discharge Instructions (Addendum)
Please follow-up with your primary care doctor, stop smoking cannabis.  Make sure you are drinking lots of fluids, and resting.  If you cannot keep anything down, please return to the ER.  You can also use hot showers, this may help with your nausea.  If you have worsening abdominal pain please return to the ER.

## 2022-07-12 NOTE — ED Notes (Signed)
D/c paperwork reviewed with pt, including prescriptions and follow up care.  No questions or concerns voiced at time of d/c. . Pt verbalized understanding, Wheeled by ED staff to ED exit, NAD.   

## 2022-07-12 NOTE — ED Triage Notes (Signed)
Vomiting and abd pain that started this am at 0500  No fever  Has staph infection in right lower ext, being treated with doxycycline

## 2022-07-12 NOTE — ED Provider Notes (Signed)
[facility] Provider Note   CSN: [id number] Arrival date & time: 07/12/22  1106     History  Chief Complaint  Patient presents with   Emesis    Benjamin Valdez is a 62 y.o. male, history of cyclic vomiting disorder, who presents to the ED secondary to diffuse abdominal pain, and vomiting that started around 5 AM today.  He states that he has had multiple episodes of vomiting, and cannot get it to stop.  Cannot keep anything down.  Notes that he has had these episodes before, and it feels very similar to his cyclic vomiting syndrome that he has.  Smokes weed daily.  Pain in his abdomen is kind of achy and crampy, worse after vomiting.  Does not have any shortness of breath chest pain, back pain.  Last bowel movement yesterday.  Of note he is a BKA patient, post sarcoma.  He is currently being treated with doxycycline for his abscess on his stump.  Surgery was in 1998.  He denies any fevers states that this has been going on for some time.     Home Medications Prior to Admission medications   Medication Sig Start Date End Date Taking? Authorizing Provider  ALPRAZolam Duanne Moron) 1 MG tablet Take 1 mg by mouth daily as needed. 06/08/22   [provider]  amitriptyline (ELAVIL) 150 MG tablet Take by mouth at bedtime. Weaning off 08/15/21   [provider]  diazepam (VALIUM) 5 MG tablet TAKE 1 TABLET BY MOUTH EVERY 8 HOURS AS NEEDED FOR ANXIETY Patient not taking: Reported on 07/09/2022 04/14/17   Elby Showers, MD  doxycycline (VIBRA-TABS) 100 MG tablet Take 1 tablet (100 mg total) by mouth 2 (two) times daily. 06/29/22   Elby Showers, MD  HYDROcodone-acetaminophen (NORCO) 5-325 MG tablet One tab every 8 hours as needed for pain. 06/29/22   Elby Showers, MD  Melatonin 10 MG TABS Take 10 mg by mouth at bedtime.    [provider]  metFORMIN (GLUCOPHAGE) 500 MG tablet Take 1 tablet (500 mg total) by mouth 2 (two) times  daily with a meal. 04/15/22   Baxley, Cresenciano Lick, MD  mupirocin ointment (BACTROBAN) 2 % Apply 1 Application topically 2 (two) times daily. 06/29/22   Elby Showers, MD  omeprazole (PRILOSEC) 40 MG capsule TAKE 1 CAPSULE BY MOUTH EVERY DAY 11/20/21   Elby Showers, MD  ondansetron (ZOFRAN-ODT) 8 MG disintegrating tablet Take 1 tablet (8 mg total) by mouth every 8 (eight) hours as needed for nausea or vomiting. 04/23/21   Dorie Rank, MD  promethazine (PHENERGAN) 25 MG suppository Place 1 suppository (25 mg total) rectally every 6 (six) hours as needed for nausea or vomiting. 07/12/22   Leighanne Adolph L, PA  promethazine (PHENERGAN) 25 MG tablet Take 1 tablet (25 mg total) by mouth every 6 (six) hours as needed for nausea or vomiting. 07/12/22   Azariya Freeman L, PA  rosuvastatin (CRESTOR) 20 MG tablet TAKE 1 TABLET BY MOUTH EVERY DAY 02/19/22   Elby Showers, MD      Allergies    Patient has no known allergies.    Review of Systems   Review of Systems  Gastrointestinal:  Positive for abdominal pain, nausea and vomiting. Negative for constipation and diarrhea.    Physical Exam Updated Vital Signs BP (!) 171/101 (BP Location: Right Arm)   Pulse 79   Temp 98.4 F (36.9 C) (Oral)   Resp  14   Ht 5\' 11"  (1.803 m)   Wt 79.7 kg   SpO2 96%   BMI 24.51 kg/m  Physical Exam Vitals and nursing note reviewed.  Constitutional:      General: He is not in acute distress.    Appearance: He is well-developed.  HENT:     Head: Normocephalic and atraumatic.  Eyes:     Conjunctiva/sclera: Conjunctivae normal.  Cardiovascular:     Rate and Rhythm: Normal rate and regular rhythm.     Heart sounds: No murmur heard. Pulmonary:     Effort: Pulmonary effort is normal. No respiratory distress.     Breath sounds: Normal breath sounds.  Abdominal:     Palpations: Abdomen is soft.     Tenderness: There is generalized abdominal tenderness.  Musculoskeletal:        General: No swelling.     Cervical back: Neck  supple.  Skin:    General: Skin is warm and dry.     Capillary Refill: Capillary refill takes less than 2 seconds.  Neurological:     Mental Status: He is alert.  Psychiatric:        Mood and Affect: Mood normal.     ED Results / Procedures / Treatments   Labs (all labs ordered are listed, but only abnormal results are displayed) Labs Reviewed  COMPREHENSIVE METABOLIC PANEL - Abnormal; Notable for the following components:      Result Value   Glucose, Bld 161 (*)    BUN 26 (*)    All other components within normal limits  CBC WITH DIFFERENTIAL/PLATELET  URINALYSIS, ROUTINE W REFLEX MICROSCOPIC  TROPONIN I (HIGH SENSITIVITY)    EKG EKG Interpretation  Date/Time:  Sunday July 12 2022 11:40:44 EDT Ventricular Rate:  64 PR Interval:  155 QRS Duration: 106 QT Interval:  430 QTC Calculation: 444 R Axis:   83 Text Interpretation: Sinus rhythm Borderline right axis deviation Confirmed by Nanda Quinton [phone]) on 07/12/2022 11:42:10 AM  Radiology CT ABDOMEN PELVIS W CONTRAST  Result Date: 07/12/2022 CLINICAL DATA:  Pain EXAM: CT ABDOMEN AND PELVIS WITH CONTRAST TECHNIQUE: Multidetector CT imaging of the abdomen and pelvis was performed using the standard protocol following bolus administration of intravenous contrast. RADIATION DOSE REDUCTION: This exam was performed according to the departmental dose-optimization program which includes automated exposure control, adjustment of the mA and/or kV according to patient size and/or use of iterative reconstruction technique. CONTRAST:  18mL OMNIPAQUE IOHEXOL 300 MG/ML  SOLN COMPARISON:  04/25/2021 FINDINGS: Lower chest: Dependent bibasilar subsegmental atelectasis. No pericardial or pleural effusion. Hepatobiliary: Several hepatic cysts identified in the measure up to 1.5 cm. No biliary ductal dilatation. Unremarkable gallbladder. Pancreas: Unremarkable. No pancreatic ductal dilatation or surrounding inflammatory changes. Spleen: Normal in  size without focal abnormality. Adrenals/Urinary Tract: Adrenal glands are unremarkable. Kidneys are normal, without renal calculi, focal lesion, or hydronephrosis. Bladder is unremarkable. Stomach/Bowel: Stomach is within normal limits. Appendix not identified and no evidence of appendicitis. No evidence of bowel wall thickening, distention, or inflammatory changes. Vascular/Lymphatic: Aortic atherosclerosis. No enlarged abdominal or pelvic lymph nodes. Reproductive: Prostate is unremarkable. Bilateral hydrocele partially imaged. Other: No abdominal wall hernia or abnormality. No abdominopelvic ascites. Musculoskeletal: Thoracolumbosacral degenerative changes. IMPRESSION: 1. Hepatic cysts. 2. Bilateral hydrocele. 3. No acute abdominal or pelvic pathology identified Electronically Signed   By: Sammie Bench M.D.   On: 07/12/2022 16:17    Procedures Procedures    Medications Ordered in ED Medications  promethazine (PHENERGAN) 25 MG/ML injection (  Not Given 07/12/22 1412)  sodium chloride 0.9 % bolus 1,000 mL (0 mLs Intravenous Stopped 07/12/22 1218)  ondansetron (ZOFRAN) injection 4 mg (4 mg Intravenous Given 07/12/22 1132)  ketorolac (TORADOL) 15 MG/ML injection 15 mg (15 mg Intravenous Given 07/12/22 1132)  droperidol (INAPSINE) 2.5 MG/ML injection 5 mg (5 mg Intravenous Given 07/12/22 1218)  promethazine (PHENERGAN) 12.5 mg in sodium chloride 0.9 % 50 mL IVPB (0 mg Intravenous Stopped 07/12/22 1438)  fentaNYL (SUBLIMAZE) injection 50 mcg (50 mcg Intravenous Given 07/12/22 1347)  metoCLOPramide (REGLAN) injection 10 mg (10 mg Intravenous Given 07/12/22 1514)  diphenhydrAMINE (BENADRYL) injection 25 mg (25 mg Intravenous Given 07/12/22 1512)  iohexol (OMNIPAQUE) 300 MG/ML solution 100 mL (100 mLs Intravenous Contrast Given 07/12/22 1536)    ED Course/ Medical Decision Making/ A&P                             Medical Decision Making Patient is a 62 year old male, history of cyclic vomiting disorder,  from cannabis.  Has diffuse abdominal pain, nausea, vomiting.  Smokes pot daily.  Needed we will obtain labs, for further evaluation as well as treat symptoms.  He is having a bowel movement so low suspicion for a bowel obstruction.  Additionally he has no infection to his left BKA stump, however he does not have any fever or chills, so I think that sepsis is unlikely.  Amount and/or Complexity of Data Reviewed Labs: ordered.    Details: Unremarkable Radiology: ordered.    Details: CT abdomen pelvis obtained secondary to intractable nausea/vomiting, abdominal pain, no acute findings. Discussion of management or test interpretation with external provider(s): Discussed with patient, no acute findings on CT abdomen pelvis, after multiple doses of medications he is feeling a little bit better able to tolerate p.o. trial.  His troponin is within normal limits.  He does not have any fever, no leukocytosis, and he is being treated for his wound on his stump.  I gave him strict return precautions and discharged home.  He is not febrile, and not hypotensive, no white count, thus I think that sepsis is unlikely.  Risk Prescription drug management.   Final Clinical Impression(s) / ED Diagnoses Final diagnoses:  Nausea and vomiting, unspecified vomiting type  Generalized abdominal pain    Rx / DC Orders ED Discharge Orders          Ordered    promethazine (PHENERGAN) 25 MG suppository  Every 6 hours PRN,   Status:  Discontinued        07/12/22 1650    promethazine (PHENERGAN) 25 MG tablet  Every 6 hours PRN,   Status:  Discontinued        07/12/22 1650    promethazine (PHENERGAN) 25 MG tablet  Every 6 hours PRN        07/12/22 1651    promethazine (PHENERGAN) 25 MG suppository  Every 6 hours PRN        07/12/22 1651              Tejasvi Brissett, Si Gaul, PA 07/12/22 1904    Margette Fast, MD 07/16/22 1559

## 2022-07-13 ENCOUNTER — Telehealth: Payer: Self-pay

## 2022-07-13 NOTE — Transitions of Care (Post Inpatient/ED Visit) (Signed)
   07/13/2022  Name: Benjamin Valdez MRN: [id number] DOB: Feb 01, 1961  Today's TOC FU Call Status: Today's TOC FU Call Status:: Unsuccessul Call (1st Attempt) Unsuccessful Call (1st Attempt) Date: 07/13/22  Attempted to reach the patient regarding the most recent Inpatient/ED visit.  Follow Up Plan: Additional outreach attempts will be made to reach the patient to complete the Transitions of Care (Post Inpatient/ED visit) call.   Signature Jaxxson Cavanah D, CMA

## 2022-07-14 ENCOUNTER — Telehealth: Payer: Self-pay

## 2022-07-14 NOTE — Transitions of Care (Post Inpatient/ED Visit) (Signed)
   07/14/2022  Name: Curtice Venkatesan MRN: [id number] DOB: [birth year]  Today's TOC FU Call Status: Today's TOC FU Call Status:: Unsuccessful Call (2nd Attempt) Unsuccessful Call (2nd Attempt) Date: 07/14/22  Attempted to reach the patient regarding the most recent Inpatient/ED visit.  Follow Up Plan: No further outreach attempts will be made at this time. We have been unable to contact the patient.  Signature Jonte Shiller D, CMA

## 2022-07-15 ENCOUNTER — Telehealth: Payer: Self-pay | Admitting: Internal Medicine

## 2022-07-15 DIAGNOSIS — F411 Generalized anxiety disorder: Secondary | ICD-10-CM | POA: Diagnosis not present

## 2022-07-15 DIAGNOSIS — F331 Major depressive disorder, recurrent, moderate: Secondary | ICD-10-CM | POA: Diagnosis not present

## 2022-07-15 NOTE — Telephone Encounter (Signed)
Patient returning call from [facility]. He is still not 100% well but slowly improving. Spoke with him personally. MJB, MD

## 2022-07-31 DIAGNOSIS — S88119D Complete traumatic amputation at level between knee and ankle, unspecified lower leg, subsequent encounter: Secondary | ICD-10-CM | POA: Diagnosis not present

## 2022-08-04 DIAGNOSIS — F331 Major depressive disorder, recurrent, moderate: Secondary | ICD-10-CM | POA: Diagnosis not present

## 2022-08-04 DIAGNOSIS — F411 Generalized anxiety disorder: Secondary | ICD-10-CM | POA: Diagnosis not present

## 2022-08-06 ENCOUNTER — Other Ambulatory Visit: Payer: Self-pay | Admitting: Internal Medicine

## 2022-08-10 DIAGNOSIS — F411 Generalized anxiety disorder: Secondary | ICD-10-CM | POA: Diagnosis not present

## 2022-08-10 DIAGNOSIS — F331 Major depressive disorder, recurrent, moderate: Secondary | ICD-10-CM | POA: Diagnosis not present

## 2022-08-24 ENCOUNTER — Other Ambulatory Visit: Payer: Self-pay | Admitting: Internal Medicine

## 2022-09-09 ENCOUNTER — Other Ambulatory Visit: Payer: Self-pay | Admitting: Internal Medicine

## 2022-09-16 DIAGNOSIS — F331 Major depressive disorder, recurrent, moderate: Secondary | ICD-10-CM | POA: Diagnosis not present

## 2022-09-16 DIAGNOSIS — F411 Generalized anxiety disorder: Secondary | ICD-10-CM | POA: Diagnosis not present

## 2022-09-28 DIAGNOSIS — F411 Generalized anxiety disorder: Secondary | ICD-10-CM | POA: Diagnosis not present

## 2022-09-28 DIAGNOSIS — F331 Major depressive disorder, recurrent, moderate: Secondary | ICD-10-CM | POA: Diagnosis not present

## 2022-10-12 ENCOUNTER — Telehealth: Payer: Self-pay

## 2022-10-12 NOTE — Telephone Encounter (Signed)
Patient not available today he is scheduled for tomorrow at 1030.

## 2022-10-12 NOTE — Telephone Encounter (Signed)
Patient would like to make an appointment to see you for tingling hands. It has been going on for a while but wants to be evaluated now. Its both hands but his left is worse most of the time. Please advise.

## 2022-10-13 ENCOUNTER — Ambulatory Visit (INDEPENDENT_AMBULATORY_CARE_PROVIDER_SITE_OTHER): Payer: Medicare HMO | Admitting: Internal Medicine

## 2022-10-13 ENCOUNTER — Encounter: Payer: Self-pay | Admitting: Internal Medicine

## 2022-10-13 VITALS — BP 126/88 | HR 69 | Temp 97.3°F | Resp 16 | Ht 71.0 in | Wt 166.5 lb

## 2022-10-13 DIAGNOSIS — R202 Paresthesia of skin: Secondary | ICD-10-CM

## 2022-10-13 DIAGNOSIS — R200 Anesthesia of skin: Secondary | ICD-10-CM | POA: Diagnosis not present

## 2022-10-13 DIAGNOSIS — G5600 Carpal tunnel syndrome, unspecified upper limb: Secondary | ICD-10-CM | POA: Diagnosis not present

## 2022-10-13 DIAGNOSIS — Z1211 Encounter for screening for malignant neoplasm of colon: Secondary | ICD-10-CM | POA: Diagnosis not present

## 2022-10-13 NOTE — Patient Instructions (Addendum)
Referral to [facility] to check for carpal tunnel syndrome. Labs are pending. Referral to [facility] GI for routine colonoscopy.

## 2022-10-13 NOTE — Addendum Note (Signed)
Addended by: Jama Flavors on: 10/13/2022 04:41 PM   Modules accepted: Orders

## 2022-10-13 NOTE — Progress Notes (Signed)
Patient Care Team: Margaree Mackintosh, MD as PCP - General (Internal Medicine) Janalyn Harder, MD (Inactive) as Consulting Physician (Dermatology)  Visit Date: 10/13/22  Subjective:    Patient ID: Benjamin Valdez , Male   DOB: 01/17/1961, 61 y.o.    MRN: [id number]   62 y.o. Male presents today for tingling, pain in right and left first, second third joints. Requesting B12 and folate. History of neck surgery with no current neck pain.  Discussed repeat colonoscopy.   Past Medical History:  Diagnosis Date   Angiosarcoma (HCC)    Of the left leg   Atypical mole 07/14/1996   Upper Mid Back (slight)   Atypical nevus 07/14/1996   Mid/Lower Right Back (slight to moderate)   Atypical nevus 12/28/1996   Right Mid Back (slight to moderate)   Atypical nevus 12/28/1996   Mid Lower Back (slight to moderate) [name]   Atypical nevus 10/26/2000   Right Abdomen (moderate)   Atypical nevus 07/27/2003   Left Lower Back (moderate) (widershave)   Atypical nevus 07/27/2003   Left Lower Back (mid) (slight) (widershave)   Atypical nevus 07/27/2003   Right Lower Back (inf) (slight) (widershave)   Atypical nevus 07/27/2003   Right Lower Back (sup) (slight) (widershave)   Atypical nevus 07/27/2003   Right Back (slight)   Atypical nevus 07/27/2003   Right Center Back (sup) (slight)   Atypical nevus 07/27/2003   Right Center Back (inf) (mild)   Atypical nevus 07/27/2003   Right Side (mild)   Atypical nevus 07/27/2003   Right Mid Back (slight) (4mm punch widershave)   Atypical nevus 03/10/2005   Left Abdomen (moderate to severe) [name]   Atypical nevus 03/10/2005   Left Upper Back,sup (moderate)   Atypical nevus 03/10/2005   Left Upper Back,medial (moderate) (observe)   Atypical nevus 03/10/2005   Left Upper Back, inf (moderate)   Atypical nevus 04/22/2006   Right Mid Back (marked) (widershave)   Bone cancer (HCC)    has left BK amputation   Chest pain April 2010    Myocardial Infarction ruled out   Dyslipidemia    Essential hypertension    Gastritis April 2010   Acute--Resolved   Hiatal hernia    Marijuana abuse    History of Marijuana use   Melanoma (HCC) 10/02/2019   LEFT LOWER BACK MM INSITU   Nausea alone    Recurrent   Neck problem    Vomiting      Family History  Problem Relation Age of Onset   Diabetes Mother    Bladder Cancer Father    Crohn's disease Brother     Social History   Social History Narrative   Not on file      Review of Systems  Constitutional:  Negative for fever and malaise/fatigue.  HENT:  Negative for congestion.   Eyes:  Negative for blurred vision.  Respiratory:  Negative for cough and shortness of breath.   Cardiovascular:  Negative for chest pain, palpitations and leg swelling.  Gastrointestinal:  Negative for vomiting.  Musculoskeletal:  Positive for joint pain (Fingers). Negative for back pain.  Skin:  Negative for rash.  Neurological:  Positive for tingling (Fingers). Negative for loss of consciousness and headaches.        Objective:   Vitals: BP 126/88   Pulse 69   Temp (!) 97.3 F (36.3 C) (Tympanic)   Resp 16   Ht 5\' 11"  (1.803 m)   Wt 166 lb 8 oz (75.5 kg)  SpO2 96%   BMI 23.22 kg/m    Physical Exam Vitals and nursing note reviewed.  Constitutional:      General: He is not in acute distress.    Appearance: Normal appearance. He is not ill-appearing.  HENT:     Head: Normocephalic and atraumatic.  Pulmonary:     Effort: Pulmonary effort is normal.  Musculoskeletal:     Comments: 5/5 muscle strength in all groups tested. Phalen's sign positive bilaterally.  Skin:    General: Skin is warm and dry.  Neurological:     Mental Status: He is alert and oriented to person, place, and time. Mental status is at baseline.     Deep Tendon Reflexes:     Reflex Scores:      Bicep reflexes are 2+ on the right side and 2+ on the left side.      Brachioradialis reflexes are 2+ on  the right side and 2+ on the left side. Psychiatric:        Mood and Affect: Mood normal.        Behavior: Behavior normal.        Thought Content: Thought content normal.        Judgment: Judgment normal.       Results:   Studies obtained and personally reviewed by me:   Labs:       Component Value Date/Time   NA 138 07/12/2022 1124   K 3.7 07/12/2022 1124   CL 103 07/12/2022 1124   CO2 24 07/12/2022 1124   GLUCOSE 161 (H) 07/12/2022 1124   BUN 26 (H) 07/12/2022 1124   CREATININE 1.20 07/12/2022 1124   CREATININE 1.18 04/07/2022 1543   CALCIUM 9.7 07/12/2022 1124   PROT 7.9 07/12/2022 1124   ALBUMIN 4.3 07/12/2022 1124   AST 25 07/12/2022 1124   ALT 17 07/12/2022 1124   ALKPHOS 72 07/12/2022 1124   BILITOT 0.5 07/12/2022 1124   GFRNONAA >60 07/12/2022 1124   GFRNONAA 57 (L) 12/29/2019 0935   GFRAA 67 12/29/2019 0935     Lab Results  Component Value Date   WBC 9.6 07/12/2022   HGB 14.9 07/12/2022   HCT 43.8 07/12/2022   MCV 89.9 07/12/2022   PLT 273 07/12/2022    Lab Results  Component Value Date   CHOL 176 07/07/2022   HDL 58 07/07/2022   LDLCALC 99 07/07/2022   TRIG 97 07/07/2022   CHOLHDL 3.0 07/07/2022    Lab Results  Component Value Date   HGBA1C 6.2 (H) 07/07/2022     No results found for: "TSH"   Lab Results  Component Value Date   PSA 1.31 04/07/2022   PSA 1.35 02/25/2021   PSA 0.9 12/29/2019      Assessment & Plan:   Tingling bilateral fingers: ordered TSH, B12 and folate. Referral for [facility] for nerve conduction testing and evaluation. May have carpal tunnel syndrome.Doubt cervical spinal stenosis. He does work out at [employer] regularly.  Referral to GI for colonoscopy scheduling.    I,Alexander Ruley,acting as a Neurosurgeon for Margaree Mackintosh, MD.,have documented all relevant documentation on the behalf of Margaree Mackintosh, MD,as directed by  Margaree Mackintosh, MD while in the presence of Margaree Mackintosh, MD.   I, Margaree Mackintosh, MD,  have reviewed all documentation for this visit. The documentation on 10/13/22 for the exam, diagnosis, procedures, and orders are all accurate and complete.

## 2022-10-14 LAB — B12 AND FOLATE PANEL
Folate: 5.5 ng/mL
Vitamin B-12: 282 pg/mL (ref 200–1100)

## 2022-10-14 LAB — TSH: TSH: 2.76 mIU/L (ref 0.40–4.50)

## 2022-10-19 DIAGNOSIS — R936 Abnormal findings on diagnostic imaging of limbs: Secondary | ICD-10-CM | POA: Diagnosis not present

## 2022-10-19 DIAGNOSIS — Z89512 Acquired absence of left leg below knee: Secondary | ICD-10-CM | POA: Diagnosis not present

## 2022-10-19 DIAGNOSIS — T8744 Infection of amputation stump, left lower extremity: Secondary | ICD-10-CM | POA: Diagnosis not present

## 2022-10-20 DIAGNOSIS — H43393 Other vitreous opacities, bilateral: Secondary | ICD-10-CM | POA: Diagnosis not present

## 2022-10-20 DIAGNOSIS — H5213 Myopia, bilateral: Secondary | ICD-10-CM | POA: Diagnosis not present

## 2022-10-20 DIAGNOSIS — Z0100 Encounter for examination of eyes and vision without abnormal findings: Secondary | ICD-10-CM | POA: Diagnosis not present

## 2022-10-20 LAB — HM DIABETES EYE EXAM

## 2022-10-30 DIAGNOSIS — L739 Follicular disorder, unspecified: Secondary | ICD-10-CM | POA: Diagnosis not present

## 2022-10-30 DIAGNOSIS — Z89512 Acquired absence of left leg below knee: Secondary | ICD-10-CM | POA: Diagnosis not present

## 2022-10-30 DIAGNOSIS — S88112D Complete traumatic amputation at level between knee and ankle, left lower leg, subsequent encounter: Secondary | ICD-10-CM | POA: Diagnosis not present

## 2022-10-30 DIAGNOSIS — S88119D Complete traumatic amputation at level between knee and ankle, unspecified lower leg, subsequent encounter: Secondary | ICD-10-CM | POA: Diagnosis not present

## 2022-11-03 DIAGNOSIS — F331 Major depressive disorder, recurrent, moderate: Secondary | ICD-10-CM | POA: Diagnosis not present

## 2022-11-03 DIAGNOSIS — F411 Generalized anxiety disorder: Secondary | ICD-10-CM | POA: Diagnosis not present

## 2022-11-20 DIAGNOSIS — F411 Generalized anxiety disorder: Secondary | ICD-10-CM | POA: Diagnosis not present

## 2022-11-20 DIAGNOSIS — F331 Major depressive disorder, recurrent, moderate: Secondary | ICD-10-CM | POA: Diagnosis not present

## 2022-12-07 DIAGNOSIS — D492 Neoplasm of unspecified behavior of bone, soft tissue, and skin: Secondary | ICD-10-CM | POA: Diagnosis not present

## 2022-12-07 DIAGNOSIS — D229 Melanocytic nevi, unspecified: Secondary | ICD-10-CM | POA: Diagnosis not present

## 2022-12-07 DIAGNOSIS — L739 Follicular disorder, unspecified: Secondary | ICD-10-CM | POA: Diagnosis not present

## 2022-12-07 DIAGNOSIS — D0359 Melanoma in situ of other part of trunk: Secondary | ICD-10-CM | POA: Diagnosis not present

## 2022-12-07 DIAGNOSIS — L02416 Cutaneous abscess of left lower limb: Secondary | ICD-10-CM | POA: Diagnosis not present

## 2022-12-07 DIAGNOSIS — L0291 Cutaneous abscess, unspecified: Secondary | ICD-10-CM | POA: Diagnosis not present

## 2022-12-07 DIAGNOSIS — L821 Other seborrheic keratosis: Secondary | ICD-10-CM | POA: Diagnosis not present

## 2022-12-07 DIAGNOSIS — Z85820 Personal history of malignant melanoma of skin: Secondary | ICD-10-CM | POA: Diagnosis not present

## 2022-12-07 DIAGNOSIS — B9561 Methicillin susceptible Staphylococcus aureus infection as the cause of diseases classified elsewhere: Secondary | ICD-10-CM | POA: Diagnosis not present

## 2022-12-07 DIAGNOSIS — Z86018 Personal history of other benign neoplasm: Secondary | ICD-10-CM | POA: Diagnosis not present

## 2022-12-07 DIAGNOSIS — L814 Other melanin hyperpigmentation: Secondary | ICD-10-CM | POA: Diagnosis not present

## 2022-12-07 DIAGNOSIS — S88119D Complete traumatic amputation at level between knee and ankle, unspecified lower leg, subsequent encounter: Secondary | ICD-10-CM | POA: Diagnosis not present

## 2022-12-08 DIAGNOSIS — F331 Major depressive disorder, recurrent, moderate: Secondary | ICD-10-CM | POA: Diagnosis not present

## 2022-12-08 DIAGNOSIS — F411 Generalized anxiety disorder: Secondary | ICD-10-CM | POA: Diagnosis not present

## 2022-12-13 ENCOUNTER — Other Ambulatory Visit: Payer: Self-pay | Admitting: Internal Medicine

## 2022-12-16 DIAGNOSIS — F411 Generalized anxiety disorder: Secondary | ICD-10-CM | POA: Diagnosis not present

## 2022-12-16 DIAGNOSIS — F331 Major depressive disorder, recurrent, moderate: Secondary | ICD-10-CM | POA: Diagnosis not present

## 2022-12-29 DIAGNOSIS — F331 Major depressive disorder, recurrent, moderate: Secondary | ICD-10-CM | POA: Diagnosis not present

## 2022-12-29 DIAGNOSIS — F411 Generalized anxiety disorder: Secondary | ICD-10-CM | POA: Diagnosis not present

## 2023-01-04 DIAGNOSIS — Z86010 Personal history of colonic polyps: Secondary | ICD-10-CM | POA: Diagnosis not present

## 2023-01-04 DIAGNOSIS — D125 Benign neoplasm of sigmoid colon: Secondary | ICD-10-CM | POA: Diagnosis not present

## 2023-01-04 DIAGNOSIS — Z09 Encounter for follow-up examination after completed treatment for conditions other than malignant neoplasm: Secondary | ICD-10-CM | POA: Diagnosis not present

## 2023-01-04 DIAGNOSIS — D128 Benign neoplasm of rectum: Secondary | ICD-10-CM | POA: Diagnosis not present

## 2023-01-04 DIAGNOSIS — Z83719 Family history of colon polyps, unspecified: Secondary | ICD-10-CM | POA: Diagnosis not present

## 2023-01-04 DIAGNOSIS — K635 Polyp of colon: Secondary | ICD-10-CM | POA: Diagnosis not present

## 2023-01-04 LAB — HM COLONOSCOPY

## 2023-01-06 DIAGNOSIS — K635 Polyp of colon: Secondary | ICD-10-CM | POA: Diagnosis not present

## 2023-01-06 DIAGNOSIS — D128 Benign neoplasm of rectum: Secondary | ICD-10-CM | POA: Diagnosis not present

## 2023-01-06 DIAGNOSIS — Z89512 Acquired absence of left leg below knee: Secondary | ICD-10-CM | POA: Diagnosis not present

## 2023-01-06 DIAGNOSIS — D125 Benign neoplasm of sigmoid colon: Secondary | ICD-10-CM | POA: Diagnosis not present

## 2023-01-14 ENCOUNTER — Encounter: Payer: Self-pay | Admitting: Neurology

## 2023-01-14 ENCOUNTER — Ambulatory Visit: Payer: Medicare HMO | Admitting: Neurology

## 2023-01-14 VITALS — BP 105/69 | HR 64 | Ht 72.0 in | Wt 168.5 lb

## 2023-01-14 DIAGNOSIS — G5630 Lesion of radial nerve, unspecified upper limb: Secondary | ICD-10-CM

## 2023-01-14 DIAGNOSIS — C419 Malignant neoplasm of bone and articular cartilage, unspecified: Secondary | ICD-10-CM | POA: Diagnosis not present

## 2023-01-14 DIAGNOSIS — R202 Paresthesia of skin: Secondary | ICD-10-CM | POA: Diagnosis not present

## 2023-01-14 DIAGNOSIS — R208 Other disturbances of skin sensation: Secondary | ICD-10-CM | POA: Diagnosis not present

## 2023-01-14 DIAGNOSIS — R200 Anesthesia of skin: Secondary | ICD-10-CM | POA: Diagnosis not present

## 2023-01-14 DIAGNOSIS — Z9989 Dependence on other enabling machines and devices: Secondary | ICD-10-CM | POA: Diagnosis not present

## 2023-01-14 NOTE — Progress Notes (Signed)
[facility]  PATIENT: Benjamin Valdez DOB: [birth year]  REFERRING DOCTOR OR PCP: [name], MD SOURCE: Patient, notes from primary care  _________________________________   HISTORICAL  CHIEF COMPLAINT:  Chief Complaint  Patient presents with   New Patient (Initial Visit)    Pt in room 11. New patient here for tingling/ pins and needles in both hands when sleeping. Pt said tingling does away eventually.     HISTORY OF PRESENT ILLNESS:  I had the pleasure of seeing your patient, Benjamin Valdez  He is a 62 year old man with a several year history of bilateral hand numbness and pain that has worsened over the last couple years.   The tingling is mostly in the dorsum of the hands digits 1 and 2 > other digits.   He shakes the hands and is better after a few minutes.    Holding his arms into his chest or bending the wrist will increase the likelihood of symptoms.  Shaking and moving around reduces the symptoms.  He denies weakness most of the time but when tingling is intense, the grip and other hand muscles seem weak.      He had an angiosarcoma at age 41 and had left BKA.   He had radiation for a recurrence.  He is using crutches more as he can't use the prosthesis currently due to a stump staph infection since March.     No change in leg strength.   No change in bladder function .  He denies diabetes but has had hyperglycemia and is on metformin.  He denies hypertension or cardiac issues.        REVIEW OF SYSTEMS: Constitutional: No fevers, chills, sweats, or change in appetite Eyes: No visual changes, double vision, eye pain Ear, nose and throat: No hearing loss, ear pain, nasal congestion, sore throat Cardiovascular: No chest pain, palpitations Respiratory:  No shortness of breath at rest or with exertion.   No wheezes GastrointestinaI: No nausea, vomiting, diarrhea, abdominal pain, fecal incontinence Genitourinary:  No dysuria, urinary retention  or frequency.  No nocturia. Musculoskeletal:  No neck pain, back pain.  He has a left BKA Integumentary: No rash, pruritus, skin lesions Neurological: as above Psychiatric: No depression at this time.  No anxiety Endocrine: No palpitations, diaphoresis, change in appetite, change in weigh or increased thirst Hematologic/Lymphatic:  No anemia, purpura, petechiae. Allergic/Immunologic: No itchy/runny eyes, nasal congestion, recent allergic reactions, rashes  ALLERGIES: No Known Allergies  HOME MEDICATIONS:  Current Outpatient Medications:    ALPRAZolam (XANAX) 1 MG tablet, Take 1 mg by mouth daily as needed., Disp: , Rfl:    FLUoxetine (PROZAC) 20 MG capsule, Take 20 mg by mouth every morning., Disp: , Rfl:    FLUoxetine (PROZAC) 40 MG capsule, Take 40 mg by mouth daily., Disp: , Rfl:    Melatonin 10 MG TABS, Take 10 mg by mouth at bedtime., Disp: , Rfl:    metFORMIN (GLUCOPHAGE) 500 MG tablet, TAKE 1 TABLET BY MOUTH 2 TIMES DAILY WITH A MEAL., Disp: 180 tablet, Rfl: 1   mupirocin ointment (BACTROBAN) 2 %, APPLY TO AFFECTED AREA TWICE A DAY, Disp: 22 g, Rfl: 0   omeprazole (PRILOSEC) 40 MG capsule, TAKE 1 CAPSULE BY MOUTH EVERY DAY, Disp: 90 capsule, Rfl: 3   ondansetron (ZOFRAN-ODT) 8 MG disintegrating tablet, Take 1 tablet (8 mg total) by mouth every 8 (eight) hours as needed for nausea or vomiting., Disp: 12 tablet, Rfl: 0   promethazine (PHENERGAN) 25  MG suppository, Place 1 suppository (25 mg total) rectally every 6 (six) hours as needed for nausea or vomiting., Disp: 12 each, Rfl: 0   promethazine (PHENERGAN) 25 MG tablet, Take 1 tablet (25 mg total) by mouth every 6 (six) hours as needed for nausea or vomiting., Disp: 12 tablet, Rfl: 0   QUEtiapine (SEROQUEL) 50 MG tablet, Take 50-100 mg by mouth at bedtime., Disp: , Rfl:    QUEtiapine (SEROQUEL) 50 MG tablet, Take 50-100 mg by mouth at bedtime., Disp: , Rfl:    rosuvastatin (CRESTOR) 20 MG tablet, TAKE 1 TABLET BY MOUTH EVERY DAY,  Disp: 90 tablet, Rfl: 3  Current Facility-Administered Medications:    hydrogen peroxide 3 % external solution, , Topical, Once, Baxley, [name], MD   mupirocin cream (BACTROBAN) 2 %, , Topical, Once, Margaree Mackintosh, MD  PAST MEDICAL HISTORY: Past Medical History:  Diagnosis Date   Angiosarcoma [facility])    Of the left leg   Atypical mole 07/14/1996   Upper Mid Back (slight)   Atypical nevus 07/14/1996   Mid/Lower Right Back (slight to moderate)   Atypical nevus 12/28/1996   Right Mid Back (slight to moderate)   Atypical nevus 12/28/1996   Mid Lower Back (slight to moderate) (widershave)   Atypical nevus 10/26/2000   Right Abdomen (moderate)   Atypical nevus 07/27/2003   Left Lower Back (moderate) (widershave)   Atypical nevus 07/27/2003   Left Lower Back (mid) (slight) (widershave)   Atypical nevus 07/27/2003   Right Lower Back (inf) (slight) (widershave)   Atypical nevus 07/27/2003   Right Lower Back (sup) (slight) (widershave)   Atypical nevus 07/27/2003   Right Back (slight)   Atypical nevus 07/27/2003   Right Center Back (sup) (slight)   Atypical nevus 07/27/2003   Right Center Back (inf) (mild)   Atypical nevus 07/27/2003   Right Side (mild)   Atypical nevus 07/27/2003   Right Mid Back (slight) (4mm punch widershave)   Atypical nevus 03/10/2005   Left Abdomen (moderate to severe) [name]   Atypical nevus 03/10/2005   Left Upper Back,sup (moderate)   Atypical nevus 03/10/2005   Left Upper Back,medial (moderate) (observe)   Atypical nevus 03/10/2005   Left Upper Back, inf (moderate)   Atypical nevus 04/22/2006   Right Mid Back (marked) (widershave)   Bone cancer (HCC)    has left BK amputation   Chest pain April 2010   Myocardial Infarction ruled out   Dyslipidemia    Essential hypertension    Gastritis April 2010   Acute--Resolved   Hiatal hernia    Marijuana abuse    History of Marijuana use   Melanoma (HCC) 10/02/2019   LEFT LOWER BACK MM INSITU    Nausea alone    Recurrent   Neck problem    Vomiting     PAST SURGICAL HISTORY: Past Surgical History:  Procedure Laterality Date   AMPUTATION     Left leg below the knee    APPENDECTOMY     CERVICAL LAMINECTOMY      FAMILY HISTORY: Family History  Problem Relation Age of Onset   Diabetes Mother    Bladder Cancer Father    Crohn's disease Brother     SOCIAL HISTORY: Social History   Socioeconomic History   Marital status: Single    Spouse name: Not on file   Number of children: 0   Years of education: Not on file   Highest education level: Associate degree: academic program  Occupational History  Not on file  Tobacco Use   Smoking status: Never   Smokeless tobacco: Never  Vaping Use   Vaping status: Never Used  Substance and Sexual Activity   Alcohol use: No    Comment: rare   Drug use: Yes    Types: Marijuana    Comment: 3 times a week   Sexual activity: Not on file  Other Topics Concern   Not on file  Social History Narrative   Wears glasses    Right handed    Drinks coffee 3 cups per week   Drinks sodas 3-4 times per week.   Social Determinants of Health   Financial Resource Strain: Not on file  Food Insecurity: Not on file  Transportation Needs: Not on file  Physical Activity: Not on file  Stress: Not on file  Social Connections: Not on file  Intimate Partner Violence: Not on file       PHYSICAL EXAM  Vitals:   01/14/23 1011  BP: 105/69  Pulse: 64  Weight: 168 lb 8 oz (76.4 kg)  Height: 6' (1.829 m)    Body mass index is 22.85 kg/m.   General: The patient is well-developed and well-nourished and in no acute distress  HEENT:  Head is [place]/AT.  Sclera are anicteric.  Funduscopic exam shows normal optic discs and retinal vessels.  Neck: No carotid bruits are noted.  The neck is nontender.  Cardiovascular: The heart has a regular rate and rhythm with a normal S1 and S2. There were no murmurs, gallops or rubs.    Skin:  Extremities are without rash or  edema.  Musculoskeletal/extremities: He has a left BKA  Neurologic Exam  Mental status: The patient is alert and oriented x 3 at the time of the examination. The patient has apparent normal recent and remote memory, with an apparently normal attention span and concentration ability.   Speech is normal.  Cranial nerves: Extraocular movements are full. Pupils are equal, round, and reactive to light and accomodation.  Visual fields are full.  Facial symmetry is present. There is good facial sensation to soft touch bilaterally.Facial strength is normal.  Trapezius and sternocleidomastoid strength is normal. No dysarthria is noted.  The tongue is midline, and the patient has symmetric elevation of the soft palate. No obvious hearing deficits are noted.  Motor:  Muscle bulk is normal.   Tone is normal. Strength is  5 / 5 in all 4 extremities.   Sensory: Sensory testing is intact to pinprick, soft touch and vibration sensation in all 4 extremities.  Coordination: Cerebellar testing reveals good finger-nose-finger and heel-to-shin bilaterally.  Gait and station: Station is normal.   Gait is normal. Tandem gait is normal. Romberg is negative.   Reflexes: Deep tendon reflexes are symmetric and normal bilaterally.   Plantar responses are flexor.    DIAGNOSTIC DATA (LABS, IMAGING, TESTING) - I reviewed patient records, labs, notes, testing and imaging myself where available.  Lab Results  Component Value Date   WBC 9.6 07/12/2022   HGB 14.9 07/12/2022   HCT 43.8 07/12/2022   MCV 89.9 07/12/2022   PLT 273 07/12/2022      Component Value Date/Time   NA 138 07/12/2022 1124   K 3.7 07/12/2022 1124   CL 103 07/12/2022 1124   CO2 24 07/12/2022 1124   GLUCOSE 161 (H) 07/12/2022 1124   BUN 26 (H) 07/12/2022 1124   CREATININE 1.20 07/12/2022 1124   CREATININE 1.18 04/07/2022 1543   CALCIUM 9.7 07/12/2022  1124   PROT 7.9 07/12/2022 1124   ALBUMIN 4.3 07/12/2022  1124   AST 25 07/12/2022 1124   ALT 17 07/12/2022 1124   ALKPHOS 72 07/12/2022 1124   BILITOT 0.5 07/12/2022 1124   GFRNONAA >60 07/12/2022 1124   GFRNONAA 57 (L) 12/29/2019 0935   GFRAA 67 12/29/2019 0935   Lab Results  Component Value Date   CHOL 176 07/07/2022   HDL 58 07/07/2022   LDLCALC 99 07/07/2022   TRIG 97 07/07/2022   CHOLHDL 3.0 07/07/2022   Lab Results  Component Value Date   HGBA1C 6.2 (H) 07/07/2022   Lab Results  Component Value Date   VITAMINB12 282 10/13/2022   Lab Results  Component Value Date   TSH 2.76 10/13/2022       ASSESSMENT AND PLAN  Dysesthesia  Numbness and tingling in both hands - Plan: NCV with EMG(electromyography)  Uses crutches - Plan: NCV with EMG(electromyography)  Bone sarcoma (HCC)  Radial nerve compression, unspecified laterality - Plan: NCV with EMG(electromyography)   In summary, Mr. Hundt is a 62 year old man with numbness and tingling in the arms.  The distribution of the symptoms is most consistent with the radial nerve (numbness is primarily on the dorsum of the hand digits 1 through 3) he does not have any weakness at this time.  I believe the most likely explanation of his symptoms is mild neuropathy from the crutches pushing up into the axilla and affecting the radial nerve or posterior cord of the brachial plexus.  We discussed getting Canadian (forearm) crutches to try to get pressure off of the axilla.  We will check an NCV/EMG study.  Based on the results additional evaluation or treatment may be necessary.  He should call for new or worsening symptoms.    Thank you for asking me to see Mr. Breaux.  Please let me know if I can be of further assistance with him or other patients in the future.   Domitila Stetler A. Epimenio Foot, MD, [facility] 01/14/2023, 1:02 PM Certified in Neurology, Clinical Neurophysiology, Sleep Medicine and Neuroimaging  [facility] [address] [place], [place] [address] [phone]

## 2023-01-19 DIAGNOSIS — F411 Generalized anxiety disorder: Secondary | ICD-10-CM | POA: Diagnosis not present

## 2023-01-29 DIAGNOSIS — Z89519 Acquired absence of unspecified leg below knee: Secondary | ICD-10-CM | POA: Diagnosis not present

## 2023-01-29 DIAGNOSIS — L0291 Cutaneous abscess, unspecified: Secondary | ICD-10-CM | POA: Diagnosis not present

## 2023-01-29 DIAGNOSIS — S88119D Complete traumatic amputation at level between knee and ankle, unspecified lower leg, subsequent encounter: Secondary | ICD-10-CM | POA: Diagnosis not present

## 2023-01-29 DIAGNOSIS — Z89512 Acquired absence of left leg below knee: Secondary | ICD-10-CM | POA: Diagnosis not present

## 2023-02-01 DIAGNOSIS — D0359 Melanoma in situ of other part of trunk: Secondary | ICD-10-CM | POA: Diagnosis not present

## 2023-02-05 DIAGNOSIS — F411 Generalized anxiety disorder: Secondary | ICD-10-CM | POA: Diagnosis not present

## 2023-02-06 DIAGNOSIS — Z0100 Encounter for examination of eyes and vision without abnormal findings: Secondary | ICD-10-CM | POA: Diagnosis not present

## 2023-02-17 DIAGNOSIS — S88119D Complete traumatic amputation at level between knee and ankle, unspecified lower leg, subsequent encounter: Secondary | ICD-10-CM | POA: Diagnosis not present

## 2023-02-22 DIAGNOSIS — L0291 Cutaneous abscess, unspecified: Secondary | ICD-10-CM | POA: Diagnosis not present

## 2023-02-22 DIAGNOSIS — T8789 Other complications of amputation stump: Secondary | ICD-10-CM | POA: Diagnosis not present

## 2023-02-22 DIAGNOSIS — G8918 Other acute postprocedural pain: Secondary | ICD-10-CM | POA: Diagnosis not present

## 2023-02-22 DIAGNOSIS — F32A Depression, unspecified: Secondary | ICD-10-CM | POA: Diagnosis not present

## 2023-02-22 DIAGNOSIS — S88119D Complete traumatic amputation at level between knee and ankle, unspecified lower leg, subsequent encounter: Secondary | ICD-10-CM | POA: Diagnosis not present

## 2023-02-22 DIAGNOSIS — F1299 Cannabis use, unspecified with unspecified cannabis-induced disorder: Secondary | ICD-10-CM | POA: Diagnosis not present

## 2023-02-22 DIAGNOSIS — F419 Anxiety disorder, unspecified: Secondary | ICD-10-CM | POA: Diagnosis not present

## 2023-02-22 DIAGNOSIS — Z89512 Acquired absence of left leg below knee: Secondary | ICD-10-CM | POA: Diagnosis not present

## 2023-02-22 DIAGNOSIS — S88112A Complete traumatic amputation at level between knee and ankle, left lower leg, initial encounter: Secondary | ICD-10-CM | POA: Diagnosis not present

## 2023-02-22 DIAGNOSIS — I10 Essential (primary) hypertension: Secondary | ICD-10-CM | POA: Diagnosis not present

## 2023-02-22 DIAGNOSIS — D1800 Hemangioma unspecified site: Secondary | ICD-10-CM | POA: Diagnosis not present

## 2023-02-22 DIAGNOSIS — K219 Gastro-esophageal reflux disease without esophagitis: Secondary | ICD-10-CM | POA: Diagnosis not present

## 2023-02-22 DIAGNOSIS — Z899 Acquired absence of limb, unspecified: Secondary | ICD-10-CM | POA: Diagnosis not present

## 2023-02-22 DIAGNOSIS — Z85820 Personal history of malignant melanoma of skin: Secondary | ICD-10-CM | POA: Diagnosis not present

## 2023-02-23 DIAGNOSIS — Z85820 Personal history of malignant melanoma of skin: Secondary | ICD-10-CM | POA: Diagnosis not present

## 2023-02-23 DIAGNOSIS — F32A Depression, unspecified: Secondary | ICD-10-CM | POA: Diagnosis not present

## 2023-02-23 DIAGNOSIS — F419 Anxiety disorder, unspecified: Secondary | ICD-10-CM | POA: Diagnosis not present

## 2023-02-23 DIAGNOSIS — D1800 Hemangioma unspecified site: Secondary | ICD-10-CM | POA: Diagnosis not present

## 2023-02-23 DIAGNOSIS — L0291 Cutaneous abscess, unspecified: Secondary | ICD-10-CM | POA: Diagnosis not present

## 2023-02-23 DIAGNOSIS — F1299 Cannabis use, unspecified with unspecified cannabis-induced disorder: Secondary | ICD-10-CM | POA: Diagnosis not present

## 2023-02-23 DIAGNOSIS — K219 Gastro-esophageal reflux disease without esophagitis: Secondary | ICD-10-CM | POA: Diagnosis not present

## 2023-02-23 DIAGNOSIS — S88119D Complete traumatic amputation at level between knee and ankle, unspecified lower leg, subsequent encounter: Secondary | ICD-10-CM | POA: Diagnosis not present

## 2023-02-23 DIAGNOSIS — I10 Essential (primary) hypertension: Secondary | ICD-10-CM | POA: Diagnosis not present

## 2023-03-05 ENCOUNTER — Ambulatory Visit: Payer: Medicare HMO | Admitting: Orthopedic Surgery

## 2023-03-05 DIAGNOSIS — T8781 Dehiscence of amputation stump: Secondary | ICD-10-CM

## 2023-03-05 DIAGNOSIS — Z89512 Acquired absence of left leg below knee: Secondary | ICD-10-CM | POA: Diagnosis not present

## 2023-03-08 ENCOUNTER — Other Ambulatory Visit: Payer: Self-pay

## 2023-03-08 ENCOUNTER — Emergency Department (HOSPITAL_BASED_OUTPATIENT_CLINIC_OR_DEPARTMENT_OTHER)
Admission: EM | Admit: 2023-03-08 | Discharge: 2023-03-09 | Disposition: A | Discharge status: Home / Self Care | Payer: Medicare HMO | Attending: Emergency Medicine | Admitting: Emergency Medicine

## 2023-03-08 ENCOUNTER — Emergency Department (HOSPITAL_BASED_OUTPATIENT_CLINIC_OR_DEPARTMENT_OTHER): Payer: Medicare HMO

## 2023-03-08 ENCOUNTER — Encounter (HOSPITAL_BASED_OUTPATIENT_CLINIC_OR_DEPARTMENT_OTHER): Payer: Self-pay | Admitting: Urology

## 2023-03-08 DIAGNOSIS — R1033 Periumbilical pain: Secondary | ICD-10-CM | POA: Diagnosis present

## 2023-03-08 DIAGNOSIS — F1210 Cannabis abuse, uncomplicated: Secondary | ICD-10-CM | POA: Insufficient documentation

## 2023-03-08 DIAGNOSIS — R112 Nausea with vomiting, unspecified: Secondary | ICD-10-CM | POA: Diagnosis not present

## 2023-03-08 DIAGNOSIS — F1290 Cannabis use, unspecified, uncomplicated: Secondary | ICD-10-CM

## 2023-03-08 DIAGNOSIS — R1013 Epigastric pain: Secondary | ICD-10-CM | POA: Diagnosis not present

## 2023-03-08 DIAGNOSIS — K7689 Other specified diseases of liver: Secondary | ICD-10-CM | POA: Diagnosis not present

## 2023-03-08 DIAGNOSIS — Z79899 Other long term (current) drug therapy: Secondary | ICD-10-CM | POA: Insufficient documentation

## 2023-03-08 DIAGNOSIS — D72829 Elevated white blood cell count, unspecified: Secondary | ICD-10-CM | POA: Insufficient documentation

## 2023-03-08 DIAGNOSIS — R109 Unspecified abdominal pain: Secondary | ICD-10-CM

## 2023-03-08 DIAGNOSIS — I10 Essential (primary) hypertension: Secondary | ICD-10-CM | POA: Insufficient documentation

## 2023-03-08 DIAGNOSIS — J9811 Atelectasis: Secondary | ICD-10-CM | POA: Diagnosis not present

## 2023-03-08 LAB — CBC
HCT: 40.8 % (ref 39.0–52.0)
HCT: 33.9 % — ABNORMAL LOW (ref 39.0–52.0)
Hemoglobin: 13.6 g/dL (ref 13.0–17.0)
Hemoglobin: 11.2 g/dL — ABNORMAL LOW (ref 13.0–17.0)
MCH: 29.9 pg (ref 26.0–34.0)
MCH: 29.3 pg (ref 26.0–34.0)
MCHC: 33.3 g/dL (ref 30.0–36.0)
MCHC: 33 g/dL (ref 30.0–36.0)
MCV: 89.7 fL (ref 80.0–100.0)
MCV: 88.7 fL (ref 80.0–100.0)
Platelets: 432 10*3/uL — ABNORMAL HIGH (ref 150–400)
Platelets: 291 10*3/uL (ref 150–400)
RBC: 4.55 MIL/uL (ref 4.22–5.81)
RBC: 3.82 MIL/uL — ABNORMAL LOW (ref 4.22–5.81)
RDW: 14.1 % (ref 11.5–15.5)
RDW: 14 % (ref 11.5–15.5)
WBC: 26.3 10*3/uL — ABNORMAL HIGH (ref 4.0–10.5)
WBC: 15.6 10*3/uL — ABNORMAL HIGH (ref 4.0–10.5)
nRBC: 0 % (ref 0.0–0.2)
nRBC: 0 % (ref 0.0–0.2)

## 2023-03-08 LAB — COMPREHENSIVE METABOLIC PANEL
ALT: 21 U/L (ref 0–44)
AST: 27 U/L (ref 15–41)
Albumin: 4.4 g/dL (ref 3.5–5.0)
Alkaline Phosphatase: 92 U/L (ref 38–126)
Anion gap: 15 (ref 5–15)
BUN: 36 mg/dL — ABNORMAL HIGH (ref 8–23)
CO2: 21 mmol/L — ABNORMAL LOW (ref 22–32)
Calcium: 10 mg/dL (ref 8.9–10.3)
Chloride: 107 mmol/L (ref 98–111)
Creatinine, Ser: 1.26 mg/dL — ABNORMAL HIGH (ref 0.61–1.24)
GFR, Estimated: >60 mL/min (ref >60)
Glucose, Bld: 156 mg/dL — ABNORMAL HIGH (ref 70–99)
Potassium: 4 mmol/L (ref 3.5–5.1)
Sodium: 143 mmol/L (ref 135–145)
Total Bilirubin: 0.5 mg/dL (ref <1.2)
Total Protein: 8.2 g/dL — ABNORMAL HIGH (ref 6.5–8.1)

## 2023-03-08 LAB — DIFFERENTIAL
Abs Immature Granulocytes: 0.37 10*3/uL — ABNORMAL HIGH (ref 0.00–0.07)
Basophils Absolute: 0.1 10*3/uL (ref 0.0–0.1)
Basophils Relative: 0 %
Eosinophils Absolute: 0.1 10*3/uL (ref 0.0–0.5)
Eosinophils Relative: 0 %
Immature Granulocytes: 1 %
Lymphocytes Relative: 7 %
Lymphs Abs: 1.8 10*3/uL (ref 0.7–4.0)
Monocytes Absolute: 1.5 10*3/uL — ABNORMAL HIGH (ref 0.1–1.0)
Monocytes Relative: 6 %
Neutro Abs: 22.3 10*3/uL — ABNORMAL HIGH (ref 1.7–7.7)
Neutrophils Relative %: 86 %

## 2023-03-08 LAB — URINALYSIS, ROUTINE W REFLEX MICROSCOPIC
Bilirubin Urine: NEGATIVE
Glucose, UA: NEGATIVE mg/dL
Hgb urine dipstick: NEGATIVE
Ketones, ur: NEGATIVE mg/dL
Leukocytes,Ua: NEGATIVE
Nitrite: NEGATIVE
Protein, ur: NEGATIVE mg/dL
Specific Gravity, Urine: 1.015 (ref 1.005–1.030)
pH: 5 (ref 5.0–8.0)

## 2023-03-08 LAB — LIPASE, BLOOD: Lipase: 27 U/L (ref 11–51)

## 2023-03-08 LAB — LACTIC ACID, PLASMA
Lactic Acid, Venous: 5.3 mmol/L (ref 0.5–1.9)
Lactic Acid, Venous: 1.6 mmol/L (ref 0.5–1.9)

## 2023-03-08 MED ORDER — IOHEXOL 300 MG/ML  SOLN
100.0000 mL | Freq: Once | INTRAMUSCULAR | Status: AC | PRN
  Administered 2023-03-08: 100 mL via INTRAVENOUS

## 2023-03-08 MED ORDER — HYDROMORPHONE HCL 1 MG/ML IJ SOLN
1.0000 mg | Freq: Once | INTRAMUSCULAR | Status: AC
  Administered 2023-03-08: 1 mg via INTRAVENOUS
  Filled 2023-03-08: qty 1

## 2023-03-08 MED ORDER — CEFTRIAXONE SODIUM 1 G IJ SOLR
1.0000 g | Freq: Once | INTRAMUSCULAR | Status: AC
  Administered 2023-03-08: 1 g via INTRAVENOUS
  Filled 2023-03-08: qty 10

## 2023-03-08 MED ORDER — SODIUM CHLORIDE 0.9 % IV BOLUS
1000.0000 mL | Freq: Once | INTRAVENOUS | Status: AC
  Administered 2023-03-08: 1000 mL via INTRAVENOUS

## 2023-03-08 MED ORDER — SUCRALFATE 1 G PO TABS: 1.0000 g | ORAL_TABLET | Freq: Three times a day (TID) | ORAL | 0 refills | Status: DC

## 2023-03-08 MED ORDER — CAPSAICIN 0.025 % EX CREA: TOPICAL_CREAM | Freq: Two times a day (BID) | CUTANEOUS | 0 refills | Status: DC | PRN

## 2023-03-08 MED ORDER — ONDANSETRON HCL 4 MG/2ML IJ SOLN
4.0000 mg | Freq: Once | INTRAMUSCULAR | Status: AC
  Administered 2023-03-08: 4 mg via INTRAVENOUS
  Filled 2023-03-08: qty 2

## 2023-03-08 MED ORDER — ONDANSETRON HCL 4 MG PO TABS: 4.0000 mg | ORAL_TABLET | ORAL | 0 refills | Status: DC | PRN

## 2023-03-08 MED ORDER — ONDANSETRON HCL 4 MG/2ML IJ SOLN
4.0000 mg | Freq: Once | INTRAMUSCULAR | Status: AC | PRN
  Administered 2023-03-08: 4 mg via INTRAVENOUS
  Filled 2023-03-08: qty 2

## 2023-03-08 NOTE — ED Triage Notes (Signed)
Pt states severe N/V that started this am  Denies diarrhea  Denies fever  States generalized abdominal pain    Had revision to left leg ambulation 2 weeks ago  Promethazine supp at 1530 with no relief

## 2023-03-08 NOTE — ED Notes (Signed)
Date and time results received: 03/08/23 2115 (use smartphrase ".now" to insert current time)  Test: lactic acid Critical Value: 5.3  Name of Provider Notified: Gwenette Greet DO  Orders Received? Or Actions Taken?:  n/a

## 2023-03-08 NOTE — ED Notes (Signed)
Pt given water for PO challenge 

## 2023-03-08 NOTE — Discharge Instructions (Addendum)
It was a pleasure caring for you today in the emergency department.  Recommend THC cessation   Please return to the emergency department for any worsening or worrisome symptoms.

## 2023-03-08 NOTE — ED Provider Notes (Addendum)
[facility] Provider Note  CSN: [id number] Arrival date & time: 03/08/23 1826  Chief Complaint(s) Emesis  HPI Benjamin Valdez is a 62 y.o. male with past medical history as below, significant for left-sided BKA, GAD, HLD, bone sarcoma, abdominal pain who presents to the ED with complaint of abd pain, nausea and vomiting.  Patient reported severe abdominal pain since early this morning, primarily epigastric to umbilicus.  Sharp and stabbing.  Nausea and vomiting ongoing.  Denies change in bowel or bladder function.  No recent falls or abdominal trauma.  No recent travel or sick contacts,p.o. intake.  No regular THC, alcohol or tobacco use.  Took promethazine at home which did not alleviate symptoms.  Unable to tolerate p.o. intake since onset of symptoms earlier today.  Of note he had prior BKA revision around 2 weeks ago, no complaints associated with his leg, reports the wound is healing appropriately. Dr Lajoyce Corners ortho.     Past Medical History Past Medical History:  Diagnosis Date   Angiosarcoma (HCC)    Of the left leg   Atypical mole 07/14/1996   Upper Mid Back (slight)   Atypical nevus 07/14/1996   Mid/Lower Right Back (slight to moderate)   Atypical nevus 12/28/1996   Right Mid Back (slight to moderate)   Atypical nevus 12/28/1996   Mid Lower Back (slight to moderate) [name]   Atypical nevus 10/26/2000   Right Abdomen (moderate)   Atypical nevus 07/27/2003   Left Lower Back (moderate) (widershave)   Atypical nevus 07/27/2003   Left Lower Back (mid) (slight) (widershave)   Atypical nevus 07/27/2003   Right Lower Back (inf) (slight) (widershave)   Atypical nevus 07/27/2003   Right Lower Back (sup) (slight) (widershave)   Atypical nevus 07/27/2003   Right Back (slight)   Atypical nevus 07/27/2003   Right Center Back (sup) (slight)   Atypical nevus 07/27/2003   Right Center Back (inf) (mild)   Atypical nevus  07/27/2003   Right Side (mild)   Atypical nevus 07/27/2003   Right Mid Back (slight) (4mm punch widershave)   Atypical nevus 03/10/2005   Left Abdomen (moderate to severe) [name]   Atypical nevus 03/10/2005   Left Upper Back,sup (moderate)   Atypical nevus 03/10/2005   Left Upper Back,medial (moderate) (observe)   Atypical nevus 03/10/2005   Left Upper Back, inf (moderate)   Atypical nevus 04/22/2006   Right Mid Back (marked) (widershave)   Bone cancer (HCC)    has left BK amputation   Chest pain April 2010   Myocardial Infarction ruled out   Dyslipidemia    Essential hypertension    Gastritis April 2010   Acute--Resolved   Hiatal hernia    Marijuana abuse    History of Marijuana use   Melanoma (HCC) 10/02/2019   LEFT LOWER BACK MM INSITU   Nausea alone    Recurrent   Neck problem    Vomiting    Patient Active Problem List   Diagnosis Date Noted   Impaired glucose tolerance 12/02/2018   Marijuana abuse 03/27/2017   GERD (gastroesophageal reflux disease) 03/19/2017   Depression with anxiety 03/19/2017   Intractable nausea and vomiting 03/19/2017   Abdominal pain 03/19/2017   GAD (generalized anxiety disorder) 02/13/2017   Hx of BKA, left (HCC) 11/16/2016   Hypercholesterolemia 01/28/2011   Bone sarcoma (HCC) 01/28/2011   Home Medication(s) Prior to Admission medications   Medication Sig Start Date End Date Taking? Authorizing Provider  capsaicin (ZOSTRIX) 0.025 %  cream Apply topically 2 (two) times daily as needed (nausea). 03/08/23  Yes Tanda Rockers A, DO  ondansetron (ZOFRAN) 4 MG tablet Take 1 tablet (4 mg total) by mouth every 4 (four) hours as needed for nausea or vomiting. 03/08/23  Yes Tanda Rockers A, DO  sucralfate (CARAFATE) 1 g tablet Take 1 tablet (1 g total) by mouth with breakfast, with lunch, and with evening meal for 7 days. 03/08/23 03/15/23 Yes Sloan Leiter, DO  ALPRAZolam Prudy Feeler) 1 MG tablet Take 1 mg by mouth daily as needed. 06/08/22    [provider]  FLUoxetine (PROZAC) 20 MG capsule Take 20 mg by mouth every morning.    [provider]  FLUoxetine (PROZAC) 40 MG capsule Take 40 mg by mouth daily.    [provider]  Melatonin 10 MG TABS Take 10 mg by mouth at bedtime.    [provider]  metFORMIN (GLUCOPHAGE) 500 MG tablet TAKE 1 TABLET BY MOUTH 2 TIMES DAILY WITH A MEAL. 08/24/22   Margaree Mackintosh, MD  mupirocin ointment (BACTROBAN) 2 % APPLY TO AFFECTED AREA TWICE A DAY 08/06/22   Margaree Mackintosh, MD  omeprazole (PRILOSEC) 40 MG capsule TAKE 1 CAPSULE BY MOUTH EVERY DAY 09/09/22   Margaree Mackintosh, MD  ondansetron (ZOFRAN-ODT) 8 MG disintegrating tablet Take 1 tablet (8 mg total) by mouth every 8 (eight) hours as needed for nausea or vomiting. 04/23/21   Linwood Dibbles, MD  promethazine (PHENERGAN) 25 MG suppository Place 1 suppository (25 mg total) rectally every 6 (six) hours as needed for nausea or vomiting. 07/12/22   Small, Brooke L, PA  promethazine (PHENERGAN) 25 MG tablet Take 1 tablet (25 mg total) by mouth every 6 (six) hours as needed for nausea or vomiting. 07/12/22   Small, Brooke L, PA  QUEtiapine (SEROQUEL) 50 MG tablet Take 50-100 mg by mouth at bedtime. 12/01/22   [provider]  QUEtiapine (SEROQUEL) 50 MG tablet Take 50-100 mg by mouth at bedtime. 09/07/22   [provider]  rosuvastatin (CRESTOR) 20 MG tablet TAKE 1 TABLET BY MOUTH EVERY DAY 12/13/22   Margaree Mackintosh, MD                                                                                                                                    Past Surgical History Past Surgical History:  Procedure Laterality Date   AMPUTATION     Left leg below the knee    APPENDECTOMY     CERVICAL LAMINECTOMY     Family History Family History  Problem Relation Age of Onset   Diabetes Mother    Bladder Cancer Father    Crohn's disease Brother     Social History Social History   Tobacco Use   Smoking status: Never    Smokeless tobacco: Never  Vaping Use   Vaping status: Never Used  Substance Use Topics  Alcohol use: No    Comment: rare   Drug use: Yes    Types: Marijuana    Comment: 3 times a week   Allergies Patient has no known allergies.  Review of Systems Review of Systems  Constitutional:  Positive for appetite change and diaphoresis. Negative for fever.  Respiratory:  Negative for cough and chest tightness.   Cardiovascular:  Negative for palpitations.  Gastrointestinal:  Positive for abdominal pain, nausea and vomiting.  Genitourinary:  Negative for difficulty urinating and dysuria.  Skin:  Negative for wound.  Neurological:  Negative for headaches.  All other systems reviewed and are negative.   Physical Exam Vital Signs  I have reviewed the triage vital signs BP 97/76   Pulse 77   Temp 98.2 F (36.8 C)   Resp 16   Ht 6' (1.829 m)   Wt 76.4 kg   SpO2 93%   BMI 22.84 kg/m  Physical Exam Vitals and nursing note reviewed.  Constitutional:      General: He is not in acute distress.    Appearance: He is well-developed. He is diaphoretic.  HENT:     Head: Normocephalic and atraumatic.     Right Ear: External ear normal.     Left Ear: External ear normal.     Mouth/Throat:     Mouth: Mucous membranes are moist.  Eyes:     General: No scleral icterus. Cardiovascular:     Rate and Rhythm: Normal rate and regular rhythm.     Pulses: Normal pulses.     Heart sounds: Normal heart sounds.  Pulmonary:     Effort: Pulmonary effort is normal. No respiratory distress.     Breath sounds: Normal breath sounds.  Abdominal:     General: Abdomen is flat.     Palpations: Abdomen is soft.     Tenderness: There is abdominal tenderness in the epigastric area. There is no guarding or rebound.    Musculoskeletal:     Cervical back: No rigidity.     Right lower leg: No edema.     Left lower leg: No edema.       Legs:     Comments: Left BKA stump appears to be healing  appropriately from recent revision.  No purulent drainage, no cellulitic changes, no streaking.  No significant TTP.  Skin:    General: Skin is warm.     Capillary Refill: Capillary refill takes less than 2 seconds.  Neurological:     Mental Status: He is alert and oriented to person, place, and time.     GCS: GCS eye subscore is 4. GCS verbal subscore is 5. GCS motor subscore is 6.  Psychiatric:        Mood and Affect: Mood normal.        Behavior: Behavior normal.     ED Results and Treatments Labs (all labs ordered are listed, but only abnormal results are displayed) Labs Reviewed  COMPREHENSIVE METABOLIC PANEL - Abnormal; Notable for the following components:      Result Value   CO2 21 (*)    Glucose, Bld 156 (*)    BUN 36 (*)    Creatinine, Ser 1.26 (*)    Total Protein 8.2 (*)    All other components within normal limits  CBC - Abnormal; Notable for the following components:   WBC 26.3 (*)    Platelets 432 (*)    All other components within normal limits  DIFFERENTIAL - Abnormal; Notable for the  following components:   Neutro Abs 22.3 (*)    Monocytes Absolute 1.5 (*)    Abs Immature Granulocytes 0.37 (*)    All other components within normal limits  LACTIC ACID, PLASMA - Abnormal; Notable for the following components:   Lactic Acid, Venous 5.3 (*)    All other components within normal limits  CULTURE, BLOOD (ROUTINE X 2)  CULTURE, BLOOD (ROUTINE X 2)  LIPASE, BLOOD  URINALYSIS, ROUTINE W REFLEX MICROSCOPIC  LACTIC ACID, PLASMA  CBC  TROPONIN I (HIGH SENSITIVITY)                                                                                                                          Radiology CT ABDOMEN PELVIS W CONTRAST  Result Date: 03/08/2023 CLINICAL DATA:  Epigastric pain. EXAM: CT ABDOMEN AND PELVIS WITH CONTRAST TECHNIQUE: Multidetector CT imaging of the abdomen and pelvis was performed using the standard protocol following bolus administration of  intravenous contrast. RADIATION DOSE REDUCTION: This exam was performed according to the departmental dose-optimization program which includes automated exposure control, adjustment of the mA and/or kV according to patient size and/or use of iterative reconstruction technique. CONTRAST:  OMNIPAQUE IOHEXOL 300 MG/ML  SOLN COMPARISON:  CT abdomen pelvis dated 07/12/2022. FINDINGS: Lower chest: Right lung base linear atelectasis/scarring. The visualized lung bases are otherwise clear. No intra-abdominal free air or free fluid. Hepatobiliary: Several small liver cysts as seen previously. Additional subcentimeter hypodense lesions are too small to characterize. No biliary dilatation. The gallbladder is unremarkable. Pancreas: Unremarkable. No pancreatic ductal dilatation or surrounding inflammatory changes. Spleen: Normal in size without focal abnormality. Adrenals/Urinary Tract: The adrenal glands unremarkable. There is no hydronephrosis on either side. There is symmetric enhancement and excretion of contrast by both kidneys. The visualized ureters and urinary bladder appear unremarkable. Stomach/Bowel: There is no bowel obstruction or active inflammation. The appendix is not visualized with certainty. No inflammatory changes identified in the right lower quadrant. Vascular/Lymphatic: Mild aortoiliac atherosclerotic disease. The IVC is unremarkable. No portal venous gas. There is no adenopathy. Reproductive: The prostate and seminal vesicles are grossly remarkable. Partially visualized bilateral hydroceles. Other: None Musculoskeletal: Degenerative changes of the spine. No acute osseous pathology. IMPRESSION: 1. No acute intra-abdominal or pelvic pathology. 2. Partially visualized bilateral hydroceles. 3.  Aortic Atherosclerosis (ICD10-I70.0). Electronically Signed   By: Elgie Collard M.D.   On: 03/08/2023 22:34    Pertinent labs & imaging results that were available during my care of the patient were  reviewed by me and considered in my medical decision making (see MDM for details).  Medications Ordered in ED Medications  ondansetron (ZOFRAN) injection 4 mg (4 mg Intravenous Given 03/08/23 1837)  iohexol (OMNIPAQUE) 300 MG/ML solution 100 mL (100 mLs Intravenous Contrast Given 03/08/23 2007)  HYDROmorphone (DILAUDID) injection 1 mg (1 mg Intravenous Given 03/08/23 2045)  ondansetron (ZOFRAN) injection 4 mg (4 mg Intravenous Given 03/08/23 2044)  sodium chloride 0.9 % bolus 1,000 mL (0 mLs  Intravenous Stopped 03/08/23 2221)  sodium chloride 0.9 % bolus 1,000 mL (1,000 mLs Intravenous New Bag/Given 03/08/23 2148)  cefTRIAXone (ROCEPHIN) 1 g in sodium chloride 0.9 % 100 mL IVPB (0 g Intravenous Stopped 03/08/23 2221)  HYDROmorphone (DILAUDID) injection 1 mg (1 mg Intravenous Given 03/08/23 2143)                                                                                                                                     Procedures .Critical Care  Performed by: Sloan Leiter, DO Authorized by: Sloan Leiter, DO   Critical care provider statement:    Critical care time (minutes):  30   Critical care time was exclusive of:  Separately billable procedures and treating other patients   Critical care was time spent personally by me on the following activities:  Development of treatment plan with patient or surrogate, discussions with consultants, evaluation of patient's response to treatment, examination of patient, ordering and review of laboratory studies, ordering and review of radiographic studies, ordering and performing treatments and interventions, pulse oximetry, re-evaluation of patient's condition, review of old charts and obtaining history from patient or surrogate   (including critical care time)  Medical Decision Making / ED Course    Medical Decision Making:    Jasmeet Maron is a 62 y.o. male  with past medical history as below, significant for left-sided BKA,  GAD, HLD, bone sarcoma, abdominal pain who presents to the ED with complaint of abd pain, nausea and vomiting.. The complaint involves an extensive differential diagnosis and also carries with it a high risk of complications and morbidity.  Serious etiology was considered. Ddx includes but is not limited to: Differential diagnosis includes but is not exclusive to acute cholecystitis, intrathoracic causes for epigastric abdominal pain, gastritis, duodenitis, pancreatitis, small bowel or large bowel obstruction, abdominal aortic aneurysm, hernia, gastritis, etc.    Complete initial physical exam performed, notably the patient  was appears to be in distress, HDS, diaphoretic.    Reviewed and confirmed nursing documentation for past medical history, family history, social history.  Vital signs reviewed.    Clinical Course as of 03/08/23 2337  Mon Mar 08, 2023  2123 Creatinine(!): 1.26 Mild increase in baseline [SG]  2123 Lactic Acid, Venous(!!): 5.3 [SG]  2123 WBC(!): 26.3 Blood pressure stable, afebrile.  Give further IV fluids, send blood cultures and start empiric antibiotics.  Concern for possible intra-abdominal infection at this time.  Pending CT imaging [SG]  2306 Patient reports feeling substantially better following intervention.  Nausea subsided.  Pain greatly improved. [SG]    Clinical Course User Index [SG] Sloan Leiter, DO     Imaging resulted, is stable  He is totally asymptomatic at this point, requesting something to eat.  Pain and nausea has resolved entirely.  Lactic acidosis has resolved  Does report THC use daily and in large amounts.  He has been  told in the past that he has cyclical vomiting syndrome versus cannabis hyperemesis syndrome.  Will ED visits in the past secondary to nausea and vomiting likely secondary to chronic cannabis use  Reports that he is going to quit using cannabis after this ED visit  Discussed labs and imaging with the patient.  He would  like to p.o. challenge.  He would like to defer admission and possible.  Will recheck CBC, p.o. challenge.  If he remains asymptomatic and can tolerate p.o. and CBC improved reasonable to discharge with close outpatient follow-up.    Handoff to incoming EDP pending repeat labs and p.o. challenge.  If he remains asymptomatic, labs show some improvement would favor discharge home, if his symptoms return or require further parenteral intervention, worsening labs would favor observation/admit.  Admission was offered to the patient.                  Additional history obtained: -Additional history obtained from spouse -External records from outside source obtained and reviewed including: Chart review including previous notes, labs, imaging, consultation notes including  Prior labs and imaging, prior ED visits, primary care documentation, home medications   Lab Tests: -I ordered, reviewed, and interpreted labs.   The pertinent results include:   Labs Reviewed  COMPREHENSIVE METABOLIC PANEL - Abnormal; Notable for the following components:      Result Value   CO2 21 (*)    Glucose, Bld 156 (*)    BUN 36 (*)    Creatinine, Ser 1.26 (*)    Total Protein 8.2 (*)    All other components within normal limits  CBC - Abnormal; Notable for the following components:   WBC 26.3 (*)    Platelets 432 (*)    All other components within normal limits  DIFFERENTIAL - Abnormal; Notable for the following components:   Neutro Abs 22.3 (*)    Monocytes Absolute 1.5 (*)    Abs Immature Granulocytes 0.37 (*)    All other components within normal limits  LACTIC ACID, PLASMA - Abnormal; Notable for the following components:   Lactic Acid, Venous 5.3 (*)    All other components within normal limits  CULTURE, BLOOD (ROUTINE X 2)  CULTURE, BLOOD (ROUTINE X 2)  LIPASE, BLOOD  URINALYSIS, ROUTINE W REFLEX MICROSCOPIC  LACTIC ACID, PLASMA  CBC  TROPONIN I (HIGH SENSITIVITY)    Notable for  leukocytosis, lactic acidosis, mildly elevated creatinine from baseline  EKG   EKG Interpretation Date/Time:    Ventricular Rate:    PR Interval:    QRS Duration:    QT Interval:    QTC Calculation:   R Axis:      Text Interpretation:           Imaging Studies ordered: I ordered imaging studies including CT abdomen pelvis I independently visualized the following imaging with scope of interpretation limited to determining acute life threatening conditions related to emergency care; findings noted above I independently visualized and interpreted imaging. I agree with the radiologist interpretation   Medicines ordered and prescription drug management: Meds ordered this encounter  Medications   ondansetron (ZOFRAN) injection 4 mg   iohexol (OMNIPAQUE) 300 MG/ML solution 100 mL   HYDROmorphone (DILAUDID) injection 1 mg   ondansetron (ZOFRAN) injection 4 mg   sodium chloride 0.9 % bolus 1,000 mL   sodium chloride 0.9 % bolus 1,000 mL   cefTRIAXone (ROCEPHIN) 1 g in sodium chloride 0.9 % 100 mL IVPB    Order  Specific Question:   Antibiotic Indication:    Answer:   Intra-abdominal   HYDROmorphone (DILAUDID) injection 1 mg   ondansetron (ZOFRAN) 4 MG tablet    Sig: Take 1 tablet (4 mg total) by mouth every 4 (four) hours as needed for nausea or vomiting.    Dispense:  12 tablet    Refill:  0   sucralfate (CARAFATE) 1 g tablet    Sig: Take 1 tablet (1 g total) by mouth with breakfast, with lunch, and with evening meal for 7 days.    Dispense:  21 tablet    Refill:  0   capsaicin (ZOSTRIX) 0.025 % cream    Sig: Apply topically 2 (two) times daily as needed (nausea).    Dispense:  60 g    Refill:  0    -I have reviewed the patients home medicines and have made adjustments as needed   Consultations Obtained: Not applicable  Cardiac Monitoring: The patient was maintained on a cardiac monitor.  I personally viewed andinterpreted the cardiac monitored which showed an  underlying rhythm of: NSR Continuous pulse oximetry interpreted by myself, 99% on RA.    Social Determinants of Health:  Diagnosis or treatment significantly limited by social determinants of health: daily thc   Reevaluation: After the interventions noted above, I reevaluated the patient and found that they have resolved  Co morbidities that complicate the patient evaluation  Past Medical History:  Diagnosis Date   Angiosarcoma (HCC)    Of the left leg   Atypical mole 07/14/1996   Upper Mid Back (slight)   Atypical nevus 07/14/1996   Mid/Lower Right Back (slight to moderate)   Atypical nevus 12/28/1996   Right Mid Back (slight to moderate)   Atypical nevus 12/28/1996   Mid Lower Back (slight to moderate) (widershave)   Atypical nevus 10/26/2000   Right Abdomen (moderate)   Atypical nevus 07/27/2003   Left Lower Back (moderate) (widershave)   Atypical nevus 07/27/2003   Left Lower Back (mid) (slight) (widershave)   Atypical nevus 07/27/2003   Right Lower Back (inf) (slight) (widershave)   Atypical nevus 07/27/2003   Right Lower Back (sup) (slight) (widershave)   Atypical nevus 07/27/2003   Right Back (slight)   Atypical nevus 07/27/2003   Right Center Back (sup) (slight)   Atypical nevus 07/27/2003   Right Center Back (inf) (mild)   Atypical nevus 07/27/2003   Right Side (mild)   Atypical nevus 07/27/2003   Right Mid Back (slight) (4mm punch widershave)   Atypical nevus 03/10/2005   Left Abdomen (moderate to severe) [name]   Atypical nevus 03/10/2005   Left Upper Back,sup (moderate)   Atypical nevus 03/10/2005   Left Upper Back,medial (moderate) (observe)   Atypical nevus 03/10/2005   Left Upper Back, inf (moderate)   Atypical nevus 04/22/2006   Right Mid Back (marked) (widershave)   Bone cancer (HCC)    has left BK amputation   Chest pain April 2010   Myocardial Infarction ruled out   Dyslipidemia    Essential hypertension    Gastritis April 2010    Acute--Resolved   Hiatal hernia    Marijuana abuse    History of Marijuana use   Melanoma (HCC) 10/02/2019   LEFT LOWER BACK MM INSITU   Nausea alone    Recurrent   Neck problem    Vomiting       Dispostion: Disposition decision including need for hospitalization was considered, and patient disposition pending at time of sign out.  Final Clinical Impression(s) / ED Diagnoses Final diagnoses:  Leukocytosis, unspecified type  Abdominal pain, unspecified abdominal location  Nausea and vomiting, unspecified vomiting type  Cannabis use disorder        Sloan Leiter, DO 03/08/23 2337    Sloan Leiter, DO 03/08/23 2338

## 2023-03-09 ENCOUNTER — Encounter (HOSPITAL_BASED_OUTPATIENT_CLINIC_OR_DEPARTMENT_OTHER): Payer: Self-pay

## 2023-03-09 ENCOUNTER — Other Ambulatory Visit: Payer: Self-pay

## 2023-03-09 ENCOUNTER — Emergency Department (HOSPITAL_BASED_OUTPATIENT_CLINIC_OR_DEPARTMENT_OTHER)
Admission: EM | Admit: 2023-03-09 | Discharge: 2023-03-09 | Disposition: A | Discharge status: Home / Self Care | Payer: Medicare HMO | Source: Home / Self Care | Attending: Emergency Medicine | Admitting: Emergency Medicine

## 2023-03-09 ENCOUNTER — Emergency Department (HOSPITAL_BASED_OUTPATIENT_CLINIC_OR_DEPARTMENT_OTHER): Payer: Medicare HMO

## 2023-03-09 DIAGNOSIS — R799 Abnormal finding of blood chemistry, unspecified: Secondary | ICD-10-CM | POA: Insufficient documentation

## 2023-03-09 DIAGNOSIS — J9811 Atelectasis: Secondary | ICD-10-CM | POA: Diagnosis not present

## 2023-03-09 DIAGNOSIS — D72829 Elevated white blood cell count, unspecified: Secondary | ICD-10-CM | POA: Insufficient documentation

## 2023-03-09 DIAGNOSIS — R1084 Generalized abdominal pain: Secondary | ICD-10-CM | POA: Insufficient documentation

## 2023-03-09 DIAGNOSIS — E876 Hypokalemia: Secondary | ICD-10-CM | POA: Insufficient documentation

## 2023-03-09 DIAGNOSIS — R109 Unspecified abdominal pain: Secondary | ICD-10-CM | POA: Diagnosis not present

## 2023-03-09 DIAGNOSIS — R112 Nausea with vomiting, unspecified: Secondary | ICD-10-CM | POA: Insufficient documentation

## 2023-03-09 LAB — CBC WITH DIFFERENTIAL/PLATELET
Abs Immature Granulocytes: 0.09 10*3/uL — ABNORMAL HIGH (ref 0.00–0.07)
Abs Immature Granulocytes: 0.07 10*3/uL (ref 0.00–0.07)
Basophils Absolute: 0 10*3/uL (ref 0.0–0.1)
Basophils Absolute: 0 10*3/uL (ref 0.0–0.1)
Basophils Relative: 0 %
Basophils Relative: 0 %
Eosinophils Absolute: 0 10*3/uL (ref 0.0–0.5)
Eosinophils Absolute: 0 10*3/uL (ref 0.0–0.5)
Eosinophils Relative: 0 %
Eosinophils Relative: 0 %
HCT: 33.1 % — ABNORMAL LOW (ref 39.0–52.0)
HCT: 36.1 % — ABNORMAL LOW (ref 39.0–52.0)
Hemoglobin: 11.1 g/dL — ABNORMAL LOW (ref 13.0–17.0)
Hemoglobin: 11.9 g/dL — ABNORMAL LOW (ref 13.0–17.0)
Immature Granulocytes: 1 %
Immature Granulocytes: 1 %
Lymphocytes Relative: 5 %
Lymphocytes Relative: 10 %
Lymphs Abs: 0.7 10*3/uL (ref 0.7–4.0)
Lymphs Abs: 1.2 10*3/uL (ref 0.7–4.0)
MCH: 29.8 pg (ref 26.0–34.0)
MCH: 29.4 pg (ref 26.0–34.0)
MCHC: 33.5 g/dL (ref 30.0–36.0)
MCHC: 33 g/dL (ref 30.0–36.0)
MCV: 89 fL (ref 80.0–100.0)
MCV: 89.1 fL (ref 80.0–100.0)
Monocytes Absolute: 0.6 10*3/uL (ref 0.1–1.0)
Monocytes Absolute: 0.9 10*3/uL (ref 0.1–1.0)
Monocytes Relative: 4 %
Monocytes Relative: 7 %
Neutro Abs: 13.4 10*3/uL — ABNORMAL HIGH (ref 1.7–7.7)
Neutro Abs: 10.5 10*3/uL — ABNORMAL HIGH (ref 1.7–7.7)
Neutrophils Relative %: 90 %
Neutrophils Relative %: 82 %
Platelets: 302 10*3/uL (ref 150–400)
Platelets: 322 10*3/uL (ref 150–400)
RBC: 3.72 MIL/uL — ABNORMAL LOW (ref 4.22–5.81)
RBC: 4.05 MIL/uL — ABNORMAL LOW (ref 4.22–5.81)
RDW: 14 % (ref 11.5–15.5)
RDW: 14.2 % (ref 11.5–15.5)
WBC: 14.7 10*3/uL — ABNORMAL HIGH (ref 4.0–10.5)
WBC: 12.7 10*3/uL — ABNORMAL HIGH (ref 4.0–10.5)
nRBC: 0 % (ref 0.0–0.2)
nRBC: 0 % (ref 0.0–0.2)

## 2023-03-09 LAB — TROPONIN I (HIGH SENSITIVITY)
Troponin I (High Sensitivity): 7 ng/L (ref <18)
Troponin I (High Sensitivity): 8 ng/L (ref <18)
Troponin I (High Sensitivity): 7 ng/L (ref <18)

## 2023-03-09 LAB — COMPREHENSIVE METABOLIC PANEL
ALT: 19 U/L (ref 0–44)
AST: 22 U/L (ref 15–41)
Albumin: 3.9 g/dL (ref 3.5–5.0)
Alkaline Phosphatase: 78 U/L (ref 38–126)
Anion gap: 11 (ref 5–15)
BUN: 24 mg/dL — ABNORMAL HIGH (ref 8–23)
CO2: 22 mmol/L (ref 22–32)
Calcium: 9.3 mg/dL (ref 8.9–10.3)
Chloride: 107 mmol/L (ref 98–111)
Creatinine, Ser: 1.07 mg/dL (ref 0.61–1.24)
GFR, Estimated: >60 mL/min (ref >60)
Glucose, Bld: 145 mg/dL — ABNORMAL HIGH (ref 70–99)
Potassium: 3.4 mmol/L — ABNORMAL LOW (ref 3.5–5.1)
Sodium: 140 mmol/L (ref 135–145)
Total Bilirubin: 0.5 mg/dL (ref <1.2)
Total Protein: 7.5 g/dL (ref 6.5–8.1)

## 2023-03-09 LAB — LIPASE, BLOOD: Lipase: 27 U/L (ref 11–51)

## 2023-03-09 LAB — RAPID URINE DRUG SCREEN, HOSP PERFORMED
Amphetamines: NOT DETECTED
Barbiturates: NOT DETECTED
Benzodiazepines: POSITIVE — AB
Cocaine: NOT DETECTED
Opiates: POSITIVE — AB
Tetrahydrocannabinol: POSITIVE — AB

## 2023-03-09 MED ORDER — PROMETHAZINE HCL 25 MG/ML IJ SOLN
INTRAMUSCULAR | Status: AC
  Filled 2023-03-09: qty 1

## 2023-03-09 MED ORDER — IOHEXOL 350 MG/ML SOLN
75.0000 mL | Freq: Once | INTRAVENOUS | Status: AC | PRN
  Administered 2023-03-09: 75 mL via INTRAVENOUS

## 2023-03-09 MED ORDER — ONDANSETRON HCL 4 MG/2ML IJ SOLN
4.0000 mg | Freq: Once | INTRAMUSCULAR | Status: AC
  Administered 2023-03-09: 4 mg via INTRAVENOUS
  Filled 2023-03-09: qty 2

## 2023-03-09 MED ORDER — METOCLOPRAMIDE HCL 5 MG/ML IJ SOLN
10.0000 mg | Freq: Once | INTRAMUSCULAR | Status: AC
  Administered 2023-03-09: 10 mg via INTRAVENOUS
  Filled 2023-03-09: qty 2

## 2023-03-09 MED ORDER — ACETAMINOPHEN 325 MG PO TABS
650.0000 mg | ORAL_TABLET | Freq: Once | ORAL | Status: AC
  Administered 2023-03-09: 650 mg via ORAL
  Filled 2023-03-09: qty 2

## 2023-03-09 MED ORDER — HALOPERIDOL LACTATE 5 MG/ML IJ SOLN
2.0000 mg | Freq: Once | INTRAMUSCULAR | Status: AC
  Administered 2023-03-09: 2 mg via INTRAVENOUS
  Filled 2023-03-09: qty 1

## 2023-03-09 MED ORDER — LACTATED RINGERS IV BOLUS
1000.0000 mL | Freq: Once | INTRAVENOUS | Status: AC
Start: 2023-03-09 — End: 2023-03-09
  Administered 2023-03-09: 1000 mL via INTRAVENOUS

## 2023-03-09 MED ORDER — SODIUM CHLORIDE 0.9 % IV SOLN
25.0000 mg | Freq: Four times a day (QID) | INTRAVENOUS | Status: DC | PRN
  Administered 2023-03-09: 25 mg via INTRAVENOUS
  Filled 2023-03-09: qty 1

## 2023-03-09 MED ORDER — CAPSAICIN 0.075 % EX CREA
TOPICAL_CREAM | Freq: Once | CUTANEOUS | Status: AC
  Filled 2023-03-09: qty 60

## 2023-03-09 MED ORDER — HYDROMORPHONE HCL 1 MG/ML IJ SOLN
0.5000 mg | Freq: Once | INTRAMUSCULAR | Status: AC
  Administered 2023-03-09: 0.5 mg via INTRAVENOUS
  Filled 2023-03-09: qty 1

## 2023-03-09 NOTE — Discharge Instructions (Addendum)
Today you were seen for nausea and vomiting.  You currently have medications at the pharmacy from your previous visit, please pick these up and take as prescribed. Thank you for letting us treat you today. After performing a physical exam and reviewing your labs, I feel you are safe to go home. Please follow up with your PCP in the next several days and provide them with your records from this visit. Return to the Emergency Room if pain becomes severe or symptoms worsen.

## 2023-03-09 NOTE — ED Notes (Signed)
Ice chips provided to pt.

## 2023-03-09 NOTE — ED Triage Notes (Signed)
Pt presents with complaints of N &V. Pt states he was here last for the same, felt better and was discharged home. N&V started again this am @ 0700. Also endorses addo pain.

## 2023-03-09 NOTE — ED Provider Notes (Signed)
Care assumed from Dr. Wallace Cullens.  Patient with epigastric pain with nausea and vomiting similar to previous episodes in the setting of THC use.  Initial workup showed elevated lactic acid and leukocytosis.  CT scan was reassuring and benign.  Awaiting p.o. challenge as well as repeat CBC.  White count downtrending to 15 from 26.  Lactate has normalized.  Abdomen soft and nontender.  Patient feels very well and wants to go home.  He is tolerating p.o.  Vital signs normal.  Patient tolerating p.o.  Troponin negative x 2 with low suspicion for ACS.  He states he has antiemetics at home and does not need any prescriptions.  Discussed again to avoid THC use.  Start with a clear liquid diet and advance slowly as tolerated.  Return precautions discussed.   Glynn Octave, MD 03/09/23 [phone]

## 2023-03-09 NOTE — ED Notes (Signed)
ED Provider at bedside. 

## 2023-03-09 NOTE — ED Provider Notes (Signed)
[place] [facility] Provider Note   CSN: [id number] Arrival date & time: 03/09/23  4098     History  Chief Complaint  Patient presents with   Emesis    Benjamin Valdez is a 62 y.o. male past medical history significant for abdominal pain, intractable nausea and vomiting, marijuana abuse, left BKA presents today for nausea and vomiting.  Patient states he was here last night for the same, felt better and was discharged home.  Nausea and vomiting began again this morning around 7 AM.  Patient endorses generalized abdominal pain which she suspects is likely muscular in nature from the repetitive vomiting.  Patient denies chest pain, shortness of breath, fever, headache, vision changes, diarrhea, or weakness.  Patient states his last marijuana use was several days ago.  Patient's wife states that they have not been able to pick up prescriptions that they were prescribed last night by Dr. Wallace Cullens.  Of note patient has several ER visits for same complaint.  Patient's most recent ER visit was last night where he was evaluated using CT abdomen with contrast and CTA which resulted in notable findings.   Emesis      Home Medications Prior to Admission medications   Medication Sig Start Date End Date Taking? Authorizing Provider  ALPRAZolam Prudy Feeler) 1 MG tablet Take 1 mg by mouth daily as needed. 06/08/22   [provider]  capsaicin (ZOSTRIX) 0.025 % cream Apply topically 2 (two) times daily as needed (nausea). 03/08/23   Sloan Leiter, DO  FLUoxetine (PROZAC) 20 MG capsule Take 20 mg by mouth every morning.    [provider]  FLUoxetine (PROZAC) 40 MG capsule Take 40 mg by mouth daily.    [provider]  Melatonin 10 MG TABS Take 10 mg by mouth at bedtime.    [provider]  metFORMIN (GLUCOPHAGE) 500 MG tablet TAKE 1 TABLET BY MOUTH 2 TIMES DAILY WITH A MEAL. 08/24/22   Margaree Mackintosh, MD  mupirocin ointment  (BACTROBAN) 2 % APPLY TO AFFECTED AREA TWICE A DAY 08/06/22   Margaree Mackintosh, MD  omeprazole (PRILOSEC) 40 MG capsule TAKE 1 CAPSULE BY MOUTH EVERY DAY 09/09/22   Margaree Mackintosh, MD  ondansetron (ZOFRAN) 4 MG tablet Take 1 tablet (4 mg total) by mouth every 4 (four) hours as needed for nausea or vomiting. 03/08/23   Sloan Leiter, DO  ondansetron (ZOFRAN-ODT) 8 MG disintegrating tablet Take 1 tablet (8 mg total) by mouth every 8 (eight) hours as needed for nausea or vomiting. 04/23/21   Linwood Dibbles, MD  promethazine (PHENERGAN) 25 MG suppository Place 1 suppository (25 mg total) rectally every 6 (six) hours as needed for nausea or vomiting. 07/12/22   Small, Brooke L, PA  promethazine (PHENERGAN) 25 MG tablet Take 1 tablet (25 mg total) by mouth every 6 (six) hours as needed for nausea or vomiting. 07/12/22   Small, Brooke L, PA  QUEtiapine (SEROQUEL) 50 MG tablet Take 50-100 mg by mouth at bedtime. 12/01/22   [provider]  QUEtiapine (SEROQUEL) 50 MG tablet Take 50-100 mg by mouth at bedtime. 09/07/22   [provider]  rosuvastatin (CRESTOR) 20 MG tablet TAKE 1 TABLET BY MOUTH EVERY DAY 12/13/22   Margaree Mackintosh, MD  sucralfate (CARAFATE) 1 g tablet Take 1 tablet (1 g total) by mouth with breakfast, with lunch, and with evening meal for 7 days. 03/08/23 03/15/23  Sloan Leiter, DO  Allergies    Patient has no known allergies.    Review of Systems   Review of Systems  Gastrointestinal:  Positive for nausea and vomiting.    Physical Exam Updated Vital Signs BP (!) 156/90   Pulse 79   Temp 98.2 F (36.8 C)   Resp 17   Ht 6' (1.829 m)   Wt 76.4 kg   SpO2 95%   BMI 22.84 kg/m  Physical Exam Vitals and nursing note reviewed.  Constitutional:      General: He is not in acute distress.    Appearance: He is well-developed.  HENT:     Head: Normocephalic and atraumatic.     Right Ear: External ear normal.     Left Ear: External ear normal.     Nose: Nose normal.      Mouth/Throat:     Mouth: Mucous membranes are moist.  Eyes:     Conjunctiva/sclera: Conjunctivae normal.  Cardiovascular:     Rate and Rhythm: Normal rate and regular rhythm.     Heart sounds: No murmur heard. Pulmonary:     Effort: Pulmonary effort is normal. No respiratory distress.     Breath sounds: Normal breath sounds.  Abdominal:     General: Bowel sounds are normal.     Palpations: Abdomen is soft.     Tenderness: There is generalized abdominal tenderness.  Musculoskeletal:        General: No swelling.     Cervical back: Neck supple.     Comments: BKA of left leg  Skin:    General: Skin is warm and dry.     Capillary Refill: Capillary refill takes less than 2 seconds.  Neurological:     Mental Status: He is alert.  Psychiatric:        Mood and Affect: Mood normal.     ED Results / Procedures / Treatments   Labs (all labs ordered are listed, but only abnormal results are displayed) Labs Reviewed  CBC WITH DIFFERENTIAL/PLATELET - Abnormal; Notable for the following components:      Result Value   WBC 12.7 (*)    RBC 4.05 (*)    Hemoglobin 11.9 (*)    HCT 36.1 (*)    Neutro Abs 10.5 (*)    All other components within normal limits  COMPREHENSIVE METABOLIC PANEL - Abnormal; Notable for the following components:   Potassium 3.4 (*)    Glucose, Bld 145 (*)    BUN 24 (*)    All other components within normal limits  LIPASE, BLOOD    EKG None  Radiology CT Angio Chest Aorta W and/or Wo Contrast  Result Date: 03/09/2023 CLINICAL DATA:  Insert x-ray.  Nausea, vomiting, abdominal pain EXAM: CT ANGIOGRAPHY CHEST WITH CONTRAST TECHNIQUE: Multidetector CT imaging of the chest was performed using the standard protocol during bolus administration of intravenous contrast. Multiplanar CT image reconstructions and MIPs were obtained to evaluate the vascular anatomy. RADIATION DOSE REDUCTION: This exam was performed according to the departmental dose-optimization program  which includes automated exposure control, adjustment of the mA and/or kV according to patient size and/or use of iterative reconstruction technique. CONTRAST:  75mL OMNIPAQUE IOHEXOL 350 MG/ML SOLN COMPARISON:  None Available. FINDINGS: Cardiovascular: No filling defects in the pulmonary arteries to suggest pulmonary emboli. Heart is normal size. Aorta is normal caliber. Mediastinum/Nodes: No mediastinal, hilar, or axillary adenopathy. Trachea and esophagus are unremarkable. Thyroid unremarkable. Lungs/Pleura: Linear areas of atelectasis in the lower lobes bilaterally. No effusions. Upper  Abdomen: No acute findings Musculoskeletal: Chest wall soft tissues are unremarkable. No acute bony abnormality. Review of the MIP images confirms the above findings. IMPRESSION: No evidence of pulmonary embolus. Bibasilar atelectasis. No acute cardiopulmonary disease. Electronically Signed   By: Charlett Nose M.D.   On: 03/09/2023 03:11   CT ABDOMEN PELVIS W CONTRAST  Result Date: 03/08/2023 CLINICAL DATA:  Epigastric pain. EXAM: CT ABDOMEN AND PELVIS WITH CONTRAST TECHNIQUE: Multidetector CT imaging of the abdomen and pelvis was performed using the standard protocol following bolus administration of intravenous contrast. RADIATION DOSE REDUCTION: This exam was performed according to the departmental dose-optimization program which includes automated exposure control, adjustment of the mA and/or kV according to patient size and/or use of iterative reconstruction technique. CONTRAST:  OMNIPAQUE IOHEXOL 300 MG/ML  SOLN COMPARISON:  CT abdomen pelvis dated 07/12/2022. FINDINGS: Lower chest: Right lung base linear atelectasis/scarring. The visualized lung bases are otherwise clear. No intra-abdominal free air or free fluid. Hepatobiliary: Several small liver cysts as seen previously. Additional subcentimeter hypodense lesions are too small to characterize. No biliary dilatation. The gallbladder is unremarkable. Pancreas:  Unremarkable. No pancreatic ductal dilatation or surrounding inflammatory changes. Spleen: Normal in size without focal abnormality. Adrenals/Urinary Tract: The adrenal glands unremarkable. There is no hydronephrosis on either side. There is symmetric enhancement and excretion of contrast by both kidneys. The visualized ureters and urinary bladder appear unremarkable. Stomach/Bowel: There is no bowel obstruction or active inflammation. The appendix is not visualized with certainty. No inflammatory changes identified in the right lower quadrant. Vascular/Lymphatic: Mild aortoiliac atherosclerotic disease. The IVC is unremarkable. No portal venous gas. There is no adenopathy. Reproductive: The prostate and seminal vesicles are grossly remarkable. Partially visualized bilateral hydroceles. Other: None Musculoskeletal: Degenerative changes of the spine. No acute osseous pathology. IMPRESSION: 1. No acute intra-abdominal or pelvic pathology. 2. Partially visualized bilateral hydroceles. 3.  Aortic Atherosclerosis (ICD10-I70.0). Electronically Signed   By: Elgie Collard M.D.   On: 03/08/2023 22:34    Procedures Procedures    Medications Ordered in ED Medications  promethazine (PHENERGAN) 25 mg in sodium chloride 0.9 % 50 mL IVPB (0 mg Intravenous Stopped 03/09/23 1533)  promethazine (PHENERGAN) 25 MG/ML injection (  See Procedure Record 03/09/23 1503)  ondansetron (ZOFRAN) injection 4 mg (4 mg Intravenous Given 03/09/23 0959)  metoCLOPramide (REGLAN) injection 10 mg (10 mg Intravenous Given 03/09/23 1035)  ondansetron (ZOFRAN) injection 4 mg (4 mg Intravenous Given 03/09/23 1210)  haloperidol lactate (HALDOL) injection 2 mg (2 mg Intravenous Given 03/09/23 1322)  lactated ringers bolus 1,000 mL (0 mLs Intravenous Stopped 03/09/23 1553)  HYDROmorphone (DILAUDID) injection 0.5 mg (0.5 mg Intravenous Given 03/09/23 1447)  capsicum (ZOSTRIX) 0.075 % cream ( Topical Given 03/09/23 1451)    ED Course/  Medical Decision Making/ A&P                                 Medical Decision Making Amount and/or Complexity of Data Reviewed Labs: ordered.  Risk OTC drugs. Prescription drug management.   This patient presents to the ED with chief complaint(s) of nausea/vomiting with pertinent past medical history of intractable nausea/vomiting which further complicates the presenting complaint. The complaint involves an extensive differential diagnosis and also carries with it a high risk of complications and morbidity.    The differential diagnosis includes cyclic vomiting syndrome, marijuana induced hyperemesis  Additional history obtained: Additional history obtained from spouse Records reviewed Primary Care  Documents  ED Course and Reassessment: Patient given Zofran for nausea Patient given numerous medications for nausea and vomiting.  Patient given p.o. challenge and tolerated oral intake without emesis.  Independent labs interpretation:  The following labs were independently interpreted:  CBC: Mild leukocytosis at 12.7 likely due to vomiting.  Still trending downwards from previous CBC at 14.7 CMP: Mild hypokalemia at 3.4 and mildly elevated BUN at 24 Lipase: 27   Consultation: - Consulted or discussed management/test interpretation w/ external professional: None  Consideration for admission or further workup: Considered for admission or further workup however patient's vital signs have remained stable throughout ER stay.  Patient's physical exam and labs have all been reassuring.  Patient was able to tolerate oral intake prior to discharge.  Patient feels safe for discharge and follow-up with internal medicine.        Final Clinical Impression(s) / ED Diagnoses Final diagnoses:  Nausea and vomiting, unspecified vomiting type    Rx / DC Orders ED Discharge Orders     None         Dolphus Jenny, PA-C 03/09/23 1723    Alvira Monday, MD 03/09/23 [phone]

## 2023-03-09 NOTE — ED Notes (Signed)
Patient has tolerated his PO fluids (ice water).  PA aware.

## 2023-03-10 ENCOUNTER — Encounter: Payer: Self-pay | Admitting: Neurology

## 2023-03-10 ENCOUNTER — Ambulatory Visit: Payer: Medicare HMO | Admitting: Neurology

## 2023-03-11 ENCOUNTER — Encounter (HOSPITAL_BASED_OUTPATIENT_CLINIC_OR_DEPARTMENT_OTHER): Payer: Self-pay | Admitting: Emergency Medicine

## 2023-03-11 ENCOUNTER — Inpatient Hospital Stay (HOSPITAL_BASED_OUTPATIENT_CLINIC_OR_DEPARTMENT_OTHER)
Admission: EM | Admit: 2023-03-11 | Discharge: 2023-03-16 | DRG: 392 | Disposition: A | Discharge status: Home / Self Care | Payer: Medicare HMO | Attending: Internal Medicine | Admitting: Internal Medicine

## 2023-03-11 ENCOUNTER — Other Ambulatory Visit: Payer: Self-pay

## 2023-03-11 ENCOUNTER — Encounter: Payer: Self-pay | Admitting: Orthopedic Surgery

## 2023-03-11 ENCOUNTER — Emergency Department (HOSPITAL_BASED_OUTPATIENT_CLINIC_OR_DEPARTMENT_OTHER): Payer: Medicare HMO

## 2023-03-11 DIAGNOSIS — R778 Other specified abnormalities of plasma proteins: Secondary | ICD-10-CM | POA: Diagnosis not present

## 2023-03-11 DIAGNOSIS — R9431 Abnormal electrocardiogram [ECG] [EKG]: Secondary | ICD-10-CM | POA: Diagnosis present

## 2023-03-11 DIAGNOSIS — I2489 Other forms of acute ischemic heart disease: Secondary | ICD-10-CM | POA: Diagnosis present

## 2023-03-11 DIAGNOSIS — F419 Anxiety disorder, unspecified: Secondary | ICD-10-CM | POA: Diagnosis present

## 2023-03-11 DIAGNOSIS — Z85820 Personal history of malignant melanoma of skin: Secondary | ICD-10-CM

## 2023-03-11 DIAGNOSIS — N281 Cyst of kidney, acquired: Secondary | ICD-10-CM | POA: Diagnosis not present

## 2023-03-11 DIAGNOSIS — F1290 Cannabis use, unspecified, uncomplicated: Secondary | ICD-10-CM | POA: Diagnosis not present

## 2023-03-11 DIAGNOSIS — Z7984 Long term (current) use of oral hypoglycemic drugs: Secondary | ICD-10-CM | POA: Diagnosis not present

## 2023-03-11 DIAGNOSIS — Z7982 Long term (current) use of aspirin: Secondary | ICD-10-CM | POA: Diagnosis not present

## 2023-03-11 DIAGNOSIS — Z89512 Acquired absence of left leg below knee: Secondary | ICD-10-CM | POA: Diagnosis not present

## 2023-03-11 DIAGNOSIS — R079 Chest pain, unspecified: Secondary | ICD-10-CM | POA: Diagnosis not present

## 2023-03-11 DIAGNOSIS — E119 Type 2 diabetes mellitus without complications: Secondary | ICD-10-CM | POA: Diagnosis present

## 2023-03-11 DIAGNOSIS — K219 Gastro-esophageal reflux disease without esophagitis: Secondary | ICD-10-CM | POA: Diagnosis present

## 2023-03-11 DIAGNOSIS — R112 Nausea with vomiting, unspecified: Secondary | ICD-10-CM | POA: Diagnosis present

## 2023-03-11 DIAGNOSIS — Z79899 Other long term (current) drug therapy: Secondary | ICD-10-CM | POA: Diagnosis not present

## 2023-03-11 DIAGNOSIS — Z85831 Personal history of malignant neoplasm of soft tissue: Secondary | ICD-10-CM | POA: Diagnosis not present

## 2023-03-11 DIAGNOSIS — E872 Acidosis, unspecified: Secondary | ICD-10-CM | POA: Diagnosis present

## 2023-03-11 DIAGNOSIS — F1210 Cannabis abuse, uncomplicated: Secondary | ICD-10-CM | POA: Diagnosis present

## 2023-03-11 DIAGNOSIS — I10 Essential (primary) hypertension: Secondary | ICD-10-CM | POA: Diagnosis present

## 2023-03-11 DIAGNOSIS — K449 Diaphragmatic hernia without obstruction or gangrene: Secondary | ICD-10-CM | POA: Diagnosis present

## 2023-03-11 DIAGNOSIS — F411 Generalized anxiety disorder: Secondary | ICD-10-CM | POA: Diagnosis not present

## 2023-03-11 DIAGNOSIS — E7800 Pure hypercholesterolemia, unspecified: Secondary | ICD-10-CM | POA: Diagnosis present

## 2023-03-11 DIAGNOSIS — I493 Ventricular premature depolarization: Secondary | ICD-10-CM | POA: Diagnosis present

## 2023-03-11 DIAGNOSIS — K7689 Other specified diseases of liver: Secondary | ICD-10-CM | POA: Diagnosis not present

## 2023-03-11 DIAGNOSIS — R7989 Other specified abnormal findings of blood chemistry: Secondary | ICD-10-CM | POA: Diagnosis present

## 2023-03-11 DIAGNOSIS — R1115 Cyclical vomiting syndrome unrelated to migraine: Principal | ICD-10-CM | POA: Diagnosis present

## 2023-03-11 DIAGNOSIS — E876 Hypokalemia: Secondary | ICD-10-CM | POA: Diagnosis present

## 2023-03-11 DIAGNOSIS — R519 Headache, unspecified: Secondary | ICD-10-CM | POA: Diagnosis not present

## 2023-03-11 DIAGNOSIS — Z8052 Family history of malignant neoplasm of bladder: Secondary | ICD-10-CM

## 2023-03-11 DIAGNOSIS — R109 Unspecified abdominal pain: Secondary | ICD-10-CM | POA: Diagnosis not present

## 2023-03-11 LAB — COMPREHENSIVE METABOLIC PANEL
ALT: 21 U/L (ref 0–44)
AST: 27 U/L (ref 15–41)
Albumin: 4.2 g/dL (ref 3.5–5.0)
Alkaline Phosphatase: 79 U/L (ref 38–126)
Anion gap: 17 — ABNORMAL HIGH (ref 5–15)
BUN: 28 mg/dL — ABNORMAL HIGH (ref 8–23)
CO2: 19 mmol/L — ABNORMAL LOW (ref 22–32)
Calcium: 9.4 mg/dL (ref 8.9–10.3)
Chloride: 102 mmol/L (ref 98–111)
Creatinine, Ser: 1.05 mg/dL (ref 0.61–1.24)
GFR, Estimated: >60 mL/min (ref >60)
Glucose, Bld: 119 mg/dL — ABNORMAL HIGH (ref 70–99)
Potassium: 3.3 mmol/L — ABNORMAL LOW (ref 3.5–5.1)
Sodium: 138 mmol/L (ref 135–145)
Total Bilirubin: 1.2 mg/dL — ABNORMAL HIGH (ref <1.2)
Total Protein: 7.9 g/dL (ref 6.5–8.1)

## 2023-03-11 LAB — LIPASE, BLOOD: Lipase: 28 U/L (ref 11–51)

## 2023-03-11 LAB — CBC WITH DIFFERENTIAL/PLATELET
Abs Immature Granulocytes: 0.02 10*3/uL (ref 0.00–0.07)
Basophils Absolute: 0 10*3/uL (ref 0.0–0.1)
Basophils Relative: 0 %
Eosinophils Absolute: 0 10*3/uL (ref 0.0–0.5)
Eosinophils Relative: 0 %
HCT: 39.5 % (ref 39.0–52.0)
Hemoglobin: 13.6 g/dL (ref 13.0–17.0)
Immature Granulocytes: 0 %
Lymphocytes Relative: 7 %
Lymphs Abs: 0.4 10*3/uL — ABNORMAL LOW (ref 0.7–4.0)
MCH: 29.5 pg (ref 26.0–34.0)
MCHC: 34.4 g/dL (ref 30.0–36.0)
MCV: 85.7 fL (ref 80.0–100.0)
Monocytes Absolute: 0.3 10*3/uL (ref 0.1–1.0)
Monocytes Relative: 5 %
Neutro Abs: 5.4 10*3/uL (ref 1.7–7.7)
Neutrophils Relative %: 88 %
Platelets: 362 10*3/uL (ref 150–400)
RBC: 4.61 MIL/uL (ref 4.22–5.81)
RDW: 13.2 % (ref 11.5–15.5)
WBC: 6.2 10*3/uL (ref 4.0–10.5)
nRBC: 0 % (ref 0.0–0.2)

## 2023-03-11 LAB — LACTIC ACID, PLASMA
Lactic Acid, Venous: 2 mmol/L (ref 0.5–1.9)
Lactic Acid, Venous: 1.2 mmol/L (ref 0.5–1.9)

## 2023-03-11 LAB — TROPONIN I (HIGH SENSITIVITY)
Troponin I (High Sensitivity): 71 ng/L — ABNORMAL HIGH (ref <18)
Troponin I (High Sensitivity): 79 ng/L — ABNORMAL HIGH (ref <18)

## 2023-03-11 LAB — URINALYSIS, ROUTINE W REFLEX MICROSCOPIC
Bilirubin Urine: NEGATIVE
Glucose, UA: NEGATIVE mg/dL
Hgb urine dipstick: NEGATIVE
Ketones, ur: 40 mg/dL — AB
Leukocytes,Ua: NEGATIVE
Nitrite: NEGATIVE
Protein, ur: NEGATIVE mg/dL
Specific Gravity, Urine: 1.02 (ref 1.005–1.030)
pH: 6 (ref 5.0–8.0)

## 2023-03-11 LAB — MAGNESIUM: Magnesium: 1.7 mg/dL (ref 1.7–2.4)

## 2023-03-11 MED ORDER — ONDANSETRON HCL 4 MG/2ML IJ SOLN
4.0000 mg | Freq: Once | INTRAMUSCULAR | Status: AC | PRN
  Administered 2023-03-11: 4 mg via INTRAVENOUS
  Filled 2023-03-11: qty 2

## 2023-03-11 MED ORDER — LACTATED RINGERS IV BOLUS
1000.0000 mL | Freq: Once | INTRAVENOUS | Status: AC
  Administered 2023-03-11: 1000 mL via INTRAVENOUS

## 2023-03-11 MED ORDER — IOHEXOL 300 MG/ML  SOLN
100.0000 mL | Freq: Once | INTRAMUSCULAR | Status: AC | PRN
  Administered 2023-03-11: 100 mL via INTRAVENOUS

## 2023-03-11 MED ORDER — MORPHINE SULFATE (PF) 4 MG/ML IV SOLN
4.0000 mg | Freq: Once | INTRAVENOUS | Status: AC
  Administered 2023-03-11: 4 mg via INTRAVENOUS
  Filled 2023-03-11: qty 1

## 2023-03-11 NOTE — Progress Notes (Signed)
Office Visit Note   Patient: Benjamin Valdez           Date of Birth: 06/22/1960           MRN: [id number] Visit Date: 03/05/2023              Requested by: Margaree Mackintosh, MD [address] [place],  [place] [address] PCP: Margaree Mackintosh, MD  Chief Complaint  Patient presents with   Left Leg - Wound Check    Hx left BKA      HPI: Patient is a 62 year old woman who is seen for initial evaluation for cellulitis and dehiscence left transtibial amputation.  Initial amputation performed at [facility] on November 4.  Patient was recently seen by [facility] clinic with concern for cellulitis and stump dehiscence.  Assessment & Plan: Visit Diagnoses:  1. Dehiscence of amputation stump of left lower extremity (HCC)   2. Left below-knee amputee [facility])     Plan: Patient was provided a stump shrinker to help decrease the edema.  Recommended elevation and range of motion.  Will plan to reevaluate in 2 weeks and anticipate suture removal.  Follow-Up Instructions: Return in about 2 weeks (around 03/19/2023).   Ortho Exam  Patient is alert, oriented, no adenopathy, well-dressed, normal affect, normal respiratory effort. Examination patient has swelling and dermatitis left transtibial amputation.  There is no ascending cellulitis.  No fluctuation minimal clear serosanguineous drainage.  Patient is at risk for wound dehiscence with her persistent swelling.  Imaging: No results found. No images are attached to the encounter.  Labs: Lab Results  Component Value Date   HGBA1C 6.2 (H) 07/07/2022   HGBA1C 6.2 (H) 04/07/2022   HGBA1C 5.7 (H) 02/25/2021   REPTSTATUS PENDING 03/08/2023   REPTSTATUS PENDING 03/08/2023   CULT  03/08/2023    NO GROWTH < 24 HOURS Performed at [facility], [address], [place], [place] [address]    CULT  03/08/2023    NO GROWTH < 24 HOURS Performed at [facility], [address], [place], [place] [address]      Lab Results   Component Value Date   ALBUMIN 3.9 03/09/2023   ALBUMIN 4.4 03/08/2023   ALBUMIN 4.3 07/12/2022    No results found for: "MG" No results found for: "VD25OH"  No results found for: "PREALBUMIN"    Latest Ref Rng & Units 03/09/2023    8:52 AM 03/09/2023    2:00 AM 03/08/2023   11:27 PM  CBC EXTENDED  WBC 4.0 - 10.5 K/uL 12.7  14.7  15.6   RBC 4.22 - 5.81 MIL/uL 4.05  3.72  3.82   Hemoglobin 13.0 - 17.0 g/dL 86.5  78.4  69.6   HCT 39.0 - 52.0 % 36.1  33.1  33.9   Platelets 150 - 400 K/uL 322  302  291   NEUT# 1.7 - 7.7 K/uL 10.5  13.4    Lymph# 0.7 - 4.0 K/uL 1.2  0.7       There is no height or weight on file to calculate BMI.  Orders:  No orders of the defined types were placed in this encounter.  No orders of the defined types were placed in this encounter.    Procedures: No procedures performed  Clinical Data: No additional findings.  ROS:  All other systems negative, except as noted in the HPI. Review of Systems  Objective: Vital Signs: There were no vitals taken for this visit.  Specialty Comments:  No specialty comments available.  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   Past Medical History:  Diagnosis Date   Angiosarcoma (HCC)    Of the left leg   Atypical mole 07/14/1996   Upper Mid Back (slight)   Atypical nevus 07/14/1996   Mid/Lower Right Back (slight to moderate)   Atypical nevus 12/28/1996   Right Mid Back (slight to moderate)   Atypical nevus 12/28/1996   Mid Lower Back (slight to moderate) (widershave)   Atypical nevus  10/26/2000   Right Abdomen (moderate)   Atypical nevus 07/27/2003   Left Lower Back (moderate) (widershave)   Atypical nevus 07/27/2003   Left Lower Back (mid) (slight) (widershave)   Atypical nevus 07/27/2003   Right Lower Back (inf) (slight) (widershave)   Atypical nevus 07/27/2003   Right Lower Back (sup) (slight) (widershave)   Atypical nevus 07/27/2003   Right Back (slight)   Atypical nevus 07/27/2003   Right Center Back (sup) (slight)   Atypical nevus 07/27/2003   Right Center Back (inf) (mild)   Atypical nevus 07/27/2003   Right Side (mild)   Atypical nevus 07/27/2003   Right Mid Back (slight) (4mm punch widershave)   Atypical nevus 03/10/2005   Left Abdomen (moderate to severe) [name]   Atypical nevus 03/10/2005   Left Upper Back,sup (moderate)   Atypical nevus 03/10/2005   Left Upper Back,medial (moderate) (observe)   Atypical nevus 03/10/2005   Left Upper Back, inf (moderate)   Atypical nevus 04/22/2006   Right Mid Back (marked) (widershave)   Bone cancer (HCC)    has left BK amputation   Chest pain April 2010   Myocardial Infarction ruled out   Dyslipidemia    Essential hypertension    Gastritis April 2010   Acute--Resolved   Hiatal hernia    Marijuana abuse    History of Marijuana use   Melanoma (HCC) 10/02/2019   LEFT LOWER BACK MM INSITU   Nausea alone    Recurrent   Neck problem    Vomiting     Family History  Problem Relation Age of Onset   Diabetes Mother    Bladder Cancer Father    Crohn's disease Brother     Past Surgical History:  Procedure Laterality Date   AMPUTATION     Left leg below the knee    APPENDECTOMY     CERVICAL LAMINECTOMY     Social History   Occupational History   Not on file  Tobacco Use   Smoking status: Never   Smokeless tobacco: Never  Vaping Use   Vaping status: Never Used  Substance and Sexual Activity   Alcohol use: No    Comment: rare   Drug use: Yes    Types: Marijuana    Comment: 3 times a  week   Sexual activity: Not on file

## 2023-03-11 NOTE — ED Triage Notes (Signed)
Patient here w/ complaints of n/v and abdominal pain since Monday. Was seen here Monday and Tuesday and given antiemetics however returns again today, patient clutching central/left sided abdominal area. Unable to keep anything PO down. Reports hypertension at home w/ no hx. Poor to no PO intake and low grade fever at home.

## 2023-03-11 NOTE — ED Provider Notes (Signed)
[place] [facility] Provider Note   CSN: [id number] Arrival date & time: 03/11/23  1923     History  Chief Complaint  Patient presents with   Abdominal Pain   Emesis    Benjamin Valdez is a 62 y.o. male.   Abdominal Pain Associated symptoms: vomiting   Emesis Associated symptoms: abdominal pain   62 year old male history of marijuana use, left BKA presenting for nausea and vomiting.  Symptoms started on Monday with nausea vomiting, abdominal pain and sweating.  Pain is mostly in his epigastrium.  Has been seen in the ED twice this week.  He had a CT scan of his chest and abdomen on the 18th and thorough medical workup and results but discharged home.  He seen again on the 19th, he had multiple rounds of antiemetics and lab workup and was again discharged home.  He is doing okay and feeling better yesterday.  Around 10 AM today he developed fever at home to 101, nausea, vomiting, diaphoresis.  He said some chills as well.  No cough or shortness of breath.  Slight frontal headache.  No neck stiffness or posterior headache.  No chest pain.  He is on multiple episodes of nonbloody and bilious emesis.  Normal bowel movements without diarrhea or blood.     Home Medications Prior to Admission medications   Medication Sig Start Date End Date Taking? Authorizing Provider  ALPRAZolam Prudy Feeler) 1 MG tablet Take 1 mg by mouth daily as needed. 06/08/22   [provider]  capsaicin (ZOSTRIX) 0.025 % cream Apply topically 2 (two) times daily as needed (nausea). 03/08/23   Sloan Leiter, DO  FLUoxetine (PROZAC) 20 MG capsule Take 20 mg by mouth every morning.    [provider]  FLUoxetine (PROZAC) 40 MG capsule Take 40 mg by mouth daily.    [provider]  Melatonin 10 MG TABS Take 10 mg by mouth at bedtime.    [provider]  metFORMIN (GLUCOPHAGE) 500 MG tablet TAKE 1 TABLET BY MOUTH 2 TIMES DAILY WITH A MEAL. 08/24/22    Margaree Mackintosh, MD  mupirocin ointment (BACTROBAN) 2 % APPLY TO AFFECTED AREA TWICE A DAY 08/06/22   Margaree Mackintosh, MD  omeprazole (PRILOSEC) 40 MG capsule TAKE 1 CAPSULE BY MOUTH EVERY DAY 09/09/22   Margaree Mackintosh, MD  ondansetron (ZOFRAN) 4 MG tablet Take 1 tablet (4 mg total) by mouth every 4 (four) hours as needed for nausea or vomiting. 03/08/23   Sloan Leiter, DO  ondansetron (ZOFRAN-ODT) 8 MG disintegrating tablet Take 1 tablet (8 mg total) by mouth every 8 (eight) hours as needed for nausea or vomiting. 04/23/21   Linwood Dibbles, MD  promethazine (PHENERGAN) 25 MG suppository Place 1 suppository (25 mg total) rectally every 6 (six) hours as needed for nausea or vomiting. 07/12/22   Small, Brooke L, PA  promethazine (PHENERGAN) 25 MG tablet Take 1 tablet (25 mg total) by mouth every 6 (six) hours as needed for nausea or vomiting. 07/12/22   Small, Brooke L, PA  QUEtiapine (SEROQUEL) 50 MG tablet Take 50-100 mg by mouth at bedtime. 12/01/22   [provider]  QUEtiapine (SEROQUEL) 50 MG tablet Take 50-100 mg by mouth at bedtime. 09/07/22   [provider]  rosuvastatin (CRESTOR) 20 MG tablet TAKE 1 TABLET BY MOUTH EVERY DAY 12/13/22   Margaree Mackintosh, MD  sucralfate (CARAFATE) 1 g tablet Take 1 tablet (1 g total)  by mouth with breakfast, with lunch, and with evening meal for 7 days. 03/08/23 03/15/23  Sloan Leiter, DO      Allergies    Patient has no known allergies.    Review of Systems   Review of Systems  Gastrointestinal:  Positive for abdominal pain and vomiting.  Review of systems completed and notable as per HPI.  ROS otherwise negative.   Physical Exam Updated Vital Signs BP 108/75   Pulse 81   Temp 99.8 F (37.7 C) (Oral)   Resp 20   Ht 6' (1.829 m)   Wt 76 kg   SpO2 94%   BMI 22.72 kg/m  Physical Exam Vitals and nursing note reviewed.  Constitutional:      General: He is in acute distress.     Appearance: He is well-developed.  HENT:     Head:  Normocephalic and atraumatic.     Mouth/Throat:     Mouth: Mucous membranes are moist.     Pharynx: Oropharynx is clear.  Eyes:     Extraocular Movements: Extraocular movements intact.     Conjunctiva/sclera: Conjunctivae normal.     Pupils: Pupils are equal, round, and reactive to light.  Cardiovascular:     Rate and Rhythm: Normal rate and regular rhythm.     Heart sounds: No murmur heard. Pulmonary:     Effort: Pulmonary effort is normal. No respiratory distress.     Breath sounds: Normal breath sounds.  Abdominal:     Palpations: Abdomen is soft.     Tenderness: There is abdominal tenderness. There is no right CVA tenderness, left CVA tenderness, guarding or rebound.  Musculoskeletal:        General: No swelling.     Cervical back: Normal range of motion and neck supple. No rigidity or tenderness.     Right lower leg: No edema.     Left lower leg: No edema.     Comments: Well-healing left BKA  Skin:    General: Skin is warm and dry.     Capillary Refill: Capillary refill takes less than 2 seconds.  Neurological:     General: No focal deficit present.     Mental Status: He is alert and oriented to person, place, and time. Mental status is at baseline.     Cranial Nerves: No cranial nerve deficit.     Sensory: No sensory deficit.     Motor: No weakness.     Coordination: Coordination normal.     Deep Tendon Reflexes: Reflexes normal.  Psychiatric:        Mood and Affect: Mood normal.     ED Results / Procedures / Treatments   Labs (all labs ordered are listed, but only abnormal results are displayed) Labs Reviewed  COMPREHENSIVE METABOLIC PANEL - Abnormal; Notable for the following components:      Result Value   Potassium 3.3 (*)    CO2 19 (*)    Glucose, Bld 119 (*)    BUN 28 (*)    Total Bilirubin 1.2 (*)    Anion gap 17 (*)    All other components within normal limits  URINALYSIS, ROUTINE W REFLEX MICROSCOPIC - Abnormal; Notable for the following components:    Ketones, ur 40 (*)    All other components within normal limits  LACTIC ACID, PLASMA - Abnormal; Notable for the following components:   Lactic Acid, Venous 2.0 (*)    All other components within normal limits  CBC WITH DIFFERENTIAL/PLATELET -  Abnormal; Notable for the following components:   Lymphs Abs 0.4 (*)    All other components within normal limits  TROPONIN I (HIGH SENSITIVITY) - Abnormal; Notable for the following components:   Troponin I (High Sensitivity) 71 (*)    All other components within normal limits  TROPONIN I (HIGH SENSITIVITY) - Abnormal; Notable for the following components:   Troponin I (High Sensitivity) 79 (*)    All other components within normal limits  LIPASE, BLOOD  LACTIC ACID, PLASMA  MAGNESIUM    EKG None  Radiology CT ABDOMEN PELVIS W CONTRAST  Result Date: 03/11/2023 CLINICAL DATA:  Abdominal pain. EXAM: CT ABDOMEN AND PELVIS WITH CONTRAST TECHNIQUE: Multidetector CT imaging of the abdomen and pelvis was performed using the standard protocol following bolus administration of intravenous contrast. RADIATION DOSE REDUCTION: This exam was performed according to the departmental dose-optimization program which includes automated exposure control, adjustment of the mA and/or kV according to patient size and/or use of iterative reconstruction technique. CONTRAST:  OMNIPAQUE IOHEXOL 300 MG/ML  SOLN COMPARISON:  CT abdomen pelvis dated 03/08/2023. FINDINGS: Lower chest: Right lung base atelectasis/scarring. The visualized lung bases are otherwise clear. No intra-abdominal free air or free fluid. Hepatobiliary: Small cyst in the left lobe of the liver. There is apparent mild fatty infiltration of the liver. No biliary dilatation. The gallbladder is unremarkable Pancreas: Unremarkable. No pancreatic ductal dilatation or surrounding inflammatory changes. Spleen: Normal in size without focal abnormality. Adrenals/Urinary Tract: The adrenal glands unremarkable.  There is no hydronephrosis on either side. Small left parapelvic cysts. There is symmetric enhancement and excretion of contrast by both kidneys. The visualized ureters and urinary bladder appear unremarkable. Stomach/Bowel: There is no bowel obstruction or active inflammation. Appendectomy. Vascular/Lymphatic: Moderate aortoiliac atherosclerotic disease. The IVC is unremarkable. No portal venous gas. There is no adenopathy. Reproductive: The prostate and seminal vesicles are grossly unremarkable. No pelvic mass. Partial visualized bilateral hydroceles. Other: None Musculoskeletal: Degenerative changes of the spine. No acute osseous pathology. IMPRESSION: 1. No acute intra-abdominal or pelvic pathology. 2. Mild fatty infiltration of the liver. 3.  Aortic Atherosclerosis (ICD10-I70.0). Electronically Signed   By: Elgie Collard M.D.   On: 03/11/2023 21:08   CT Head Wo Contrast  Result Date: 03/11/2023 CLINICAL DATA:  Headache, increasing frequency or severity EXAM: CT HEAD WITHOUT CONTRAST TECHNIQUE: Contiguous axial images were obtained from the base of the skull through the vertex without intravenous contrast. RADIATION DOSE REDUCTION: This exam was performed according to the departmental dose-optimization program which includes automated exposure control, adjustment of the mA and/or kV according to patient size and/or use of iterative reconstruction technique. COMPARISON:  Head CT 02/12/2012 FINDINGS: Brain: No intracranial hemorrhage, mass effect, or midline shift. No hydrocephalus. The basilar cisterns are patent. No evidence of territorial infarct or acute ischemia. No extra-axial or intracranial fluid collection. Vascular: No hyperdense vessel or unexpected calcification. Skull: No fracture or focal lesion. Sinuses/Orbits: Mild mucosal thickening of the maxillary sinuses. No sinus fluid levels. The mastoid air cells are clear. Other: None available. IMPRESSION: 1. No acute intracranial abnormality. 2.  Mild mucosal thickening of the maxillary sinuses. Electronically Signed   By: Narda Rutherford M.D.   On: 03/11/2023 21:00    Procedures Procedures    Medications Ordered in ED Medications  ondansetron (ZOFRAN) injection 4 mg (4 mg Intravenous Given 03/11/23 1958)  lactated ringers bolus 1,000 mL (0 mLs Intravenous Stopped 03/11/23 2247)  morphine (PF) 4 MG/ML injection 4 mg (4 mg Intravenous Given  03/11/23 2055)  iohexol (OMNIPAQUE) 300 MG/ML solution 100 mL (100 mLs Intravenous Contrast Given 03/11/23 2053)    ED Course/ Medical Decision Making/ A&P                                 Medical Decision Making Amount and/or Complexity of Data Reviewed Labs: ordered. Radiology: ordered.  Risk Prescription drug management. Decision regarding hospitalization.   Medical Decision Making:   Stephfon Marsh is a 62 y.o. male who presented to the ED today with recurrent nausea, vomiting, abdominal pain.  Also notable for hypertension.  He also has borderline fever and had fever at home today.  On exam he is uncomfortable.  He has mild periumbilical and epigastric tenderness.  Suspect this could be related to vomiting but will repeat CT scan given persistence to rule out obstruction, other intra-abdominal pathology.  Lactic acid here slightly elevated.  He did have fever at home, unclear source may be a viral gastroenteritis.  He has a mild frontal headache but no neck stiffness, I do not think this is meningitis.  Will get a CT scan just to rule out intracranial mass or obstruction given his hypertension and persistent vomiting.   Patient placed on continuous vitals and telemetry monitoring while in ED which was reviewed periodically.  Reviewed and confirmed nursing documentation for past medical history, family history, social history.   Reassessment and Plan:   CT head and abdomen unremarkable.  Labs notable for lactic acid 2 which downtrended with fluids, mild gap acidosis, mild  hypokalemia.  Troponin 71->79.  No chest pain, EKG with some subtle ST depression.  Low concern for ACS, suspect likely demand in setting of persistent vomiting.  I think he would benefit from admission for observation for rehydration, trending troponin.  Patient comfortable with this plan.  Discussed with Dr. Loney Loh and admitted.    Patient's presentation is most consistent with acute complicated illness / injury requiring diagnostic workup.           Final Clinical Impression(s) / ED Diagnoses Final diagnoses:  Nausea and vomiting, unspecified vomiting type  Elevated troponin    Rx / DC Orders ED Discharge Orders     None         Laurence Spates, MD 03/12/23 0001

## 2023-03-11 NOTE — ED Notes (Addendum)
Pt transported to CT via wheelchair.

## 2023-03-11 NOTE — ED Notes (Signed)
Re Paged [facility] @ 23:31, ED Doctor still waiting for a call back.

## 2023-03-11 NOTE — Plan of Care (Signed)
Plan of Care Note for accepted transfer   [facility] will assume care on arrival to accepting facility. Until arrival, care as per EDP. However, [facility] available 24/7 for questions and assistance.  Check [email or web address] for on-call coverage.  Nursing staff, please call [facility] Admits & Consults System-Wide number under Amion on patient's arrival so appropriate admitting provider can evaluate the pt.

## 2023-03-11 NOTE — ED Notes (Signed)
Re Paged for [facility] @ 23:49 ED Doctor still has not gotten a call back yet.

## 2023-03-12 DIAGNOSIS — E7800 Pure hypercholesterolemia, unspecified: Secondary | ICD-10-CM | POA: Diagnosis not present

## 2023-03-12 DIAGNOSIS — E119 Type 2 diabetes mellitus without complications: Secondary | ICD-10-CM | POA: Diagnosis not present

## 2023-03-12 DIAGNOSIS — Z85831 Personal history of malignant neoplasm of soft tissue: Secondary | ICD-10-CM

## 2023-03-12 DIAGNOSIS — Z7982 Long term (current) use of aspirin: Secondary | ICD-10-CM | POA: Diagnosis not present

## 2023-03-12 DIAGNOSIS — F419 Anxiety disorder, unspecified: Secondary | ICD-10-CM | POA: Diagnosis present

## 2023-03-12 DIAGNOSIS — Z79899 Other long term (current) drug therapy: Secondary | ICD-10-CM | POA: Diagnosis not present

## 2023-03-12 DIAGNOSIS — I2489 Other forms of acute ischemic heart disease: Secondary | ICD-10-CM | POA: Diagnosis not present

## 2023-03-12 DIAGNOSIS — I10 Essential (primary) hypertension: Secondary | ICD-10-CM | POA: Diagnosis not present

## 2023-03-12 DIAGNOSIS — R112 Nausea with vomiting, unspecified: Secondary | ICD-10-CM

## 2023-03-12 DIAGNOSIS — F1210 Cannabis abuse, uncomplicated: Secondary | ICD-10-CM | POA: Diagnosis not present

## 2023-03-12 DIAGNOSIS — I493 Ventricular premature depolarization: Secondary | ICD-10-CM | POA: Diagnosis not present

## 2023-03-12 DIAGNOSIS — K449 Diaphragmatic hernia without obstruction or gangrene: Secondary | ICD-10-CM | POA: Diagnosis not present

## 2023-03-12 DIAGNOSIS — F1290 Cannabis use, unspecified, uncomplicated: Secondary | ICD-10-CM | POA: Diagnosis present

## 2023-03-12 DIAGNOSIS — E876 Hypokalemia: Secondary | ICD-10-CM | POA: Diagnosis present

## 2023-03-12 DIAGNOSIS — R7989 Other specified abnormal findings of blood chemistry: Secondary | ICD-10-CM | POA: Diagnosis not present

## 2023-03-12 DIAGNOSIS — E872 Acidosis, unspecified: Secondary | ICD-10-CM | POA: Diagnosis not present

## 2023-03-12 DIAGNOSIS — K219 Gastro-esophageal reflux disease without esophagitis: Secondary | ICD-10-CM | POA: Diagnosis not present

## 2023-03-12 DIAGNOSIS — Z8052 Family history of malignant neoplasm of bladder: Secondary | ICD-10-CM | POA: Diagnosis not present

## 2023-03-12 DIAGNOSIS — Z85820 Personal history of malignant melanoma of skin: Secondary | ICD-10-CM | POA: Diagnosis not present

## 2023-03-12 DIAGNOSIS — R9431 Abnormal electrocardiogram [ECG] [EKG]: Secondary | ICD-10-CM | POA: Diagnosis not present

## 2023-03-12 DIAGNOSIS — Z89512 Acquired absence of left leg below knee: Secondary | ICD-10-CM | POA: Diagnosis not present

## 2023-03-12 DIAGNOSIS — Z7984 Long term (current) use of oral hypoglycemic drugs: Secondary | ICD-10-CM | POA: Diagnosis not present

## 2023-03-12 LAB — HEMOGLOBIN A1C
Hgb A1c MFr Bld: 6 % — ABNORMAL HIGH (ref 4.8–5.6)
Mean Plasma Glucose: 125.5 mg/dL

## 2023-03-12 LAB — MRSA NEXT GEN BY PCR, NASAL: MRSA by PCR Next Gen: NOT DETECTED

## 2023-03-12 LAB — C-REACTIVE PROTEIN: CRP: <0.5 mg/dL (ref <1.0)

## 2023-03-12 LAB — HIV ANTIBODY (ROUTINE TESTING W REFLEX): HIV Screen 4th Generation wRfx: NONREACTIVE

## 2023-03-12 MED ORDER — SODIUM CHLORIDE 0.9 % IV SOLN: INTRAVENOUS | Status: DC

## 2023-03-12 MED ORDER — ALBUTEROL SULFATE (2.5 MG/3ML) 0.083% IN NEBU: 2.5000 mg | INHALATION_SOLUTION | Freq: Four times a day (QID) | RESPIRATORY_TRACT | Status: DC | PRN

## 2023-03-12 MED ORDER — MAGNESIUM SULFATE IN D5W 1-5 GM/100ML-% IV SOLN
1.0000 g | Freq: Once | INTRAVENOUS | Status: AC
  Administered 2023-03-12: 1 g via INTRAVENOUS
  Filled 2023-03-12: qty 100

## 2023-03-12 MED ORDER — TRIMETHOBENZAMIDE HCL 100 MG/ML IM SOLN
200.0000 mg | Freq: Four times a day (QID) | INTRAMUSCULAR | Status: DC | PRN
  Administered 2023-03-13 (×2): 200 mg via INTRAMUSCULAR
  Filled 2023-03-12 (×3): qty 2

## 2023-03-12 MED ORDER — DOXYCYCLINE HYCLATE 100 MG PO TABS
100.0000 mg | ORAL_TABLET | Freq: Two times a day (BID) | ORAL | Status: DC
  Administered 2023-03-12 – 2023-03-13 (×3): 100 mg via ORAL
  Filled 2023-03-12 (×3): qty 1

## 2023-03-12 MED ORDER — DOCUSATE SODIUM 100 MG PO CAPS: 100.0000 mg | ORAL_CAPSULE | Freq: Two times a day (BID) | ORAL | Status: DC | PRN

## 2023-03-12 MED ORDER — BREXPIPRAZOLE 1 MG PO TABS
1.0000 mg | ORAL_TABLET | Freq: Every morning | ORAL | Status: DC
  Administered 2023-03-12 – 2023-03-16 (×5): 1 mg via ORAL
  Filled 2023-03-12 (×5): qty 1

## 2023-03-12 MED ORDER — FLUOXETINE HCL 20 MG PO CAPS
60.0000 mg | ORAL_CAPSULE | Freq: Every day | ORAL | Status: DC
  Administered 2023-03-12 – 2023-03-16 (×5): 60 mg via ORAL
  Filled 2023-03-12 (×5): qty 3

## 2023-03-12 MED ORDER — ACETAMINOPHEN 650 MG RE SUPP
650.0000 mg | Freq: Four times a day (QID) | RECTAL | Status: DC | PRN
  Administered 2023-03-13 (×2): 650 mg via RECTAL
  Filled 2023-03-12 (×2): qty 1

## 2023-03-12 MED ORDER — PANTOPRAZOLE SODIUM 40 MG PO TBEC
40.0000 mg | DELAYED_RELEASE_TABLET | Freq: Every day | ORAL | Status: DC
Start: 2023-03-12 — End: 2023-03-13
  Administered 2023-03-12 – 2023-03-13 (×2): 40 mg via ORAL
  Filled 2023-03-12 (×2): qty 1

## 2023-03-12 MED ORDER — FLUOXETINE HCL 40 MG PO CAPS: 40.0000 mg | ORAL_CAPSULE | Freq: Every day | ORAL | Status: DC

## 2023-03-12 MED ORDER — SODIUM CHLORIDE 0.9% FLUSH
3.0000 mL | Freq: Two times a day (BID) | INTRAVENOUS | Status: DC
  Administered 2023-03-12 – 2023-03-16 (×8): 3 mL via INTRAVENOUS

## 2023-03-12 MED ORDER — POTASSIUM CHLORIDE IN NACL 40-0.9 MEQ/L-% IV SOLN
INTRAVENOUS | Status: AC
Start: 2023-03-12 — End: 2023-03-12
  Filled 2023-03-12: qty 1000

## 2023-03-12 MED ORDER — ROSUVASTATIN CALCIUM 20 MG PO TABS
20.0000 mg | ORAL_TABLET | Freq: Every day | ORAL | Status: DC
  Administered 2023-03-12 – 2023-03-16 (×5): 20 mg via ORAL
  Filled 2023-03-12 (×5): qty 1

## 2023-03-12 MED ORDER — FLUOXETINE HCL 20 MG PO CAPS: 20.0000 mg | ORAL_CAPSULE | Freq: Every morning | ORAL | Status: DC

## 2023-03-12 MED ORDER — QUETIAPINE FUMARATE 50 MG PO TABS
150.0000 mg | ORAL_TABLET | Freq: Every day | ORAL | Status: DC
  Administered 2023-03-12 – 2023-03-15 (×3): 150 mg via ORAL
  Filled 2023-03-12 (×3): qty 1

## 2023-03-12 MED ORDER — ASPIRIN 81 MG PO TBEC
81.0000 mg | DELAYED_RELEASE_TABLET | Freq: Every day | ORAL | Status: DC
  Administered 2023-03-12 – 2023-03-16 (×5): 81 mg via ORAL
  Filled 2023-03-12 (×6): qty 1

## 2023-03-12 MED ORDER — ALPRAZOLAM 0.5 MG PO TABS
1.0000 mg | ORAL_TABLET | Freq: Every day | ORAL | Status: DC | PRN
  Administered 2023-03-15: 1 mg via ORAL
  Filled 2023-03-12: qty 2

## 2023-03-12 MED ORDER — ACETAMINOPHEN 325 MG PO TABS: 650.0000 mg | ORAL_TABLET | Freq: Four times a day (QID) | ORAL | Status: DC | PRN

## 2023-03-12 MED ORDER — ENOXAPARIN SODIUM 40 MG/0.4ML IJ SOSY
40.0000 mg | PREFILLED_SYRINGE | INTRAMUSCULAR | Status: DC
  Administered 2023-03-12 – 2023-03-16 (×5): 40 mg via SUBCUTANEOUS
  Filled 2023-03-12 (×5): qty 0.4

## 2023-03-12 NOTE — H&P (Addendum)
History and Physical    Patient: Benjamin Valdez ZOX:[id number] DOB: 08-15-1960 DOA: 03/11/2023 DOS: the patient was seen and examined on 03/12/2023 PCP: Margaree Mackintosh, MD  Patient coming from: Transfer from [facility]  Chief Complaint:  Chief Complaint  Patient presents with   Abdominal Pain   Emesis   HPI: Benjamin Valdez is a 62 y.o. 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  He reports having pain in the middle of his abdomen that he thinks is secondary to vomiting.  Emesis has been nonbloody in appearance.  He has had symptoms like this previously in the past, but usually do not last this long.  Patient notes that he went to [facility] 3 separate occasions due to his symptoms.  On the 11/18 he had been evaluated with a CT scan of his abdomen pelvis which noted no acute intra-abdominal pelvic pathology with partially visualized bilateral hydroceles.  Urinalysis showed no signs for infection at that time.  UDS was positive for marijuana, benzodiazepines, and opiates.  Patient had been given IV fluids, antiemetics, Rocephin IV, and Dilaudid for pain.  He had been given prescriptions for capsaicin gel, Zofran, and Carafate.  Patient notes that the capsaicin gel had not provided any relief he was seen back in the emergency department the following morning as the symptoms had not improved, and again was treated symptomatically and discharged.  However the third time patient went back to the emergency department he reported that he had a fever up to 101 F for which admission was felt warranted.  He also notes that emesis felt like he was vomiting up salt water.  He had also reported having some chest discomfort but thought it was secondary to vomiting.  The only abdominal surgery that he recalls was an appendectomy that was done  for appendicitis when he was 62 years old.  Of note patient had recently had revision of his BKA approximately 2 weeks ago.  Since that time he did report bumping his left leg against something.  The initial surgery had occurred back in 1998 for angiosarcoma.  He is followed by [facility] orthopedics and had just recently been represcribed a course of doxycycline.  He reports been having difficulty taking the medication due to the nausea and vomiting symptoms.  Patient does admit to using marijuana on a daily basis since his below knee amputation.  On admission into the emergency department patient was noted to be afebrile with blood pressures elevated up to 177/106, mild tachypnea, and all other vital signs relatively maintained.  Labs significant for CBC within normal limits, potassium 3.3, BUN 28, creatinine 1.05, lactic acid 2-> 1.2, and high-sensitivity troponin 71->79.  CT scan of the head did not note any acute abnormality.  CT scan of the chest abdomen pelvis noted no acute abnormality with mild fatty infiltration of the liver and aortic atherosclerosis.  Patient had been given 1 L lactated Ringer's, Zofran 4 mg IV, and morphine 4 mg IV.    Review of Systems: As mentioned in the history of present illness. All other systems reviewed and are negative. Past Medical History:  Diagnosis Date   Angiosarcoma [facility])    Of the left leg   Atypical mole 07/14/1996   Upper Mid Back (slight)   Atypical nevus 07/14/1996   Mid/Lower Right Back (slight to moderate)   Atypical  nevus 12/28/1996   Right Mid Back (slight to moderate)   Atypical nevus 12/28/1996   Mid Lower Back (slight to moderate) [name]   Atypical nevus 10/26/2000   Right Abdomen (moderate)   Atypical nevus 07/27/2003   Left Lower Back (moderate) (widershave)   Atypical nevus 07/27/2003   Left Lower Back (mid) (slight) (widershave)   Atypical nevus 07/27/2003   Right Lower Back (inf) (slight) (widershave)   Atypical nevus 07/27/2003    Right Lower Back (sup) (slight) (widershave)   Atypical nevus 07/27/2003   Right Back (slight)   Atypical nevus 07/27/2003   Right Center Back (sup) (slight)   Atypical nevus 07/27/2003   Right Center Back (inf) (mild)   Atypical nevus 07/27/2003   Right Side (mild)   Atypical nevus 07/27/2003   Right Mid Back (slight) (4mm punch widershave)   Atypical nevus 03/10/2005   Left Abdomen (moderate to severe) [name]   Atypical nevus 03/10/2005   Left Upper Back,sup (moderate)   Atypical nevus 03/10/2005   Left Upper Back,medial (moderate) (observe)   Atypical nevus 03/10/2005   Left Upper Back, inf (moderate)   Atypical nevus 04/22/2006   Right Mid Back (marked) (widershave)   Bone cancer (HCC)    has left BK amputation   Chest pain April 2010   Myocardial Infarction ruled out   Dyslipidemia    Essential hypertension    Gastritis April 2010   Acute--Resolved   Hiatal hernia    Marijuana abuse    History of Marijuana use   Melanoma (HCC) 10/02/2019   LEFT LOWER BACK MM INSITU   Nausea alone    Recurrent   Neck problem    Vomiting    Past Surgical History:  Procedure Laterality Date   AMPUTATION     Left leg below the knee    APPENDECTOMY     CERVICAL LAMINECTOMY     Social History:  reports that he has never smoked. He has never used smokeless tobacco. He reports current drug use. Drug: Marijuana. He reports that he does not drink alcohol.  No Known Allergies  Family History  Problem Relation Age of Onset   Diabetes Mother    Bladder Cancer Father    Crohn's disease Brother     Prior to Admission medications   Medication Sig Start Date End Date Taking? Authorizing Provider  ALPRAZolam Prudy Feeler) 1 MG tablet Take 1 mg by mouth daily as needed for anxiety. 06/08/22  Yes [provider]  aspirin EC 81 MG tablet Take 81 mg by mouth daily. Swallow whole.   Yes [provider]  docusate sodium (COLACE) 100 MG capsule Take 100 mg by mouth 2 (two)  times daily as needed for mild constipation.   Yes [provider]  doxycycline (ADOXA) 100 MG tablet Take 100 mg by mouth 2 (two) times daily. X14 days 02/23/23  Yes [provider]  FLUoxetine (PROZAC) 20 MG capsule Take 20 mg by mouth every morning. TDD 60mg    Yes [provider]  FLUoxetine (PROZAC) 40 MG capsule Take 40 mg by mouth daily. TDD 60mg    Yes [provider]  omeprazole (PRILOSEC) 40 MG capsule TAKE 1 CAPSULE BY MOUTH EVERY DAY 09/09/22  Yes Baxley, [name], MD  ondansetron (ZOFRAN) 4 MG tablet Take 1 tablet (4 mg total) by mouth every 4 (four) hours as needed for nausea or vomiting. 03/08/23  Yes Sloan Leiter, DO  promethazine (PHENERGAN) 25 MG suppository Place 1 suppository (25 mg total)  rectally every 6 (six) hours as needed for nausea or vomiting. 07/12/22  Yes Small, Brooke L, PA  promethazine (PHENERGAN) 25 MG tablet Take 1 tablet (25 mg total) by mouth every 6 (six) hours as needed for nausea or vomiting. 07/12/22  Yes Small, Brooke L, PA  QUEtiapine (SEROQUEL) 50 MG tablet Take 150 mg by mouth at bedtime. 12/01/22  Yes [provider]  REXULTI 1 MG TABS tablet Take 1 mg by mouth every morning. 02/22/23  Yes [provider]  rosuvastatin (CRESTOR) 20 MG tablet TAKE 1 TABLET BY MOUTH EVERY DAY 12/13/22  Yes Baxley, [name], MD  capsaicin (ZOSTRIX) 0.025 % cream Apply topically 2 (two) times daily as needed (nausea). Patient not taking: Reported on 03/12/2023 03/08/23   Tanda Rockers A, DO  doxycycline (VIBRA-TABS) 100 MG tablet PLEASE SEE ATTACHED FOR DETAILED DIRECTIONS Patient not taking: Reported on 03/12/2023 01/29/23   [provider]  gabapentin (NEURONTIN) 100 MG capsule Take 100 mg by mouth 3 (three) times daily. X3 days Patient not taking: Reported on 03/12/2023 02/23/23   [provider]  metFORMIN (GLUCOPHAGE) 500 MG tablet TAKE 1 TABLET BY MOUTH 2 TIMES DAILY WITH A MEAL. Patient not taking: Reported  on 03/12/2023 08/24/22   Margaree Mackintosh, MD  oxyCODONE (OXY IR/ROXICODONE) 5 MG immediate release tablet Take 5 mg by mouth every 4 (four) hours as needed. Patient not taking: Reported on 03/12/2023 02/26/23   [provider]  sucralfate (CARAFATE) 1 g tablet Take 1 tablet (1 g total) by mouth with breakfast, with lunch, and with evening meal for 7 days. Patient not taking: Reported on 03/12/2023 03/08/23 03/15/23  Tanda Rockers A, DO  traZODone (DESYREL) 50 MG tablet Take 50-100 mg by mouth at bedtime. Patient not taking: Reported on 03/12/2023 11/01/22   [provider]    Physical Exam: Vitals:   03/12/23 0045 03/12/23 0130 03/12/23 0220 03/12/23 0734  BP: 119/85 117/83 (!) 126/90   Pulse: 72 73 72 73  Resp: 13 19 20    Temp:   99 F (37.2 C) 97.6 F (36.4 C)  TempSrc:   Oral Oral  SpO2: 94% 94% 93%   Weight:   73.9 kg   Height:   6' (1.829 m)    Constitutional: Older adult male currently in no acute distress Eyes: PERRL, lids and conjunctivae normal ENMT: Mucous membranes are moist. Posterior pharynx clear of any exudate or lesions.Normal dentition.  Neck: normal, supple, no masses, no thyromegaly Respiratory: clear to auscultation bilaterally, no wheezing, no crackles. . No accessory muscle use.  Cardiovascular: Regular rate and rhythm, no murmurs / rubs / gallops. No extremity edema. 2+ pedal pulses.  Abdomen: no tenderness, no masses palpated. No hepatosplenomegaly. Bowel sounds positive.  Musculoskeletal: no clubbing / cyanosis.  Left BKA Skin: Surgical wound of the left BKA without significant erythema, but does appear to have some dehiscence  Neurologic: CN 2-12 grossly intact.  Strength 5/5 in all 4.  Psychiatric: Normal judgment and insight. Alert and oriented x 3. Normal mood.   Data Reviewed:  EKG revealed normal sinus rhythm at 92 bpm with multiple PVCs and QTc 536.  Reviewed labs, imaging, and pertinent records as documented.  Assessment and  Plan: Intractable nausea and vomiting Acute.  Patient presents with 4-day history of persistent nausea and vomiting.   records note UDS positive for benzodiazepines, opiates, and marijuana when last checked on 11/18.  On the differential includes cannabinoid hyperemesis syndrome versus gastroenteritis versus opioid -Admit  to a cardiac telemetry bed -Aspiration precautions with elevation head of the bed -Monitor intake and output -Normal saline IV fluids with 40 meq of potassium chloride IV and 100 mL/h x 1 L -Tigan as needed for nausea and vomiting due to prolonged QT  Elevated troponin Acute.  Patient noted having some chest discomfort thought secondary to vomiting.  High-sensitivity troponins 71->79.  Suspect secondary to demand. -Check echocardiogram.  Formally consult cardiology if noted to be abnormal  PVCs Patient noted to have PVCs on EKG.   -Goal potassium at least 4 and magnesium at least 2.  Replace electrolytes as needed to goal -Follow-up telemetry  Hypokalemia Acute.  Initial potassium noted to be 3.2. -Potassium chloride added with IV fluids.  History of angiosarcoma of the left leg S/p BKA Patient with prior history of 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  Wound appears to be healing and patient has been on doxycycline for treatment.  Records note he was last seen by Dr. Lajoyce Corners in the office on 11/15 with plan for reevaluating to remove sutures in 2 weeks. -Continue doxycycline -Recommend patient follow-up with Dr. Lajoyce Corners in the outpatient setting, or possibly have Ortho look at imaging to see if patient needs resuturing  Prolonged QT interval QTc was 536 when last checked. -Correct electrolyte abnormalities -Avoid QT prolonging medications  Controlled diabetes mellitus type 2, without long-term use of insulin Patient had been on metformin, but appears to  have been discontinued.  Last hemoglobin A1c was 6.2 when checked on 07/07/2022. -Check hemoglobin A1c  Anxiety -Continue Prozac and Xanax as needed  Hyperlipidemia -Continue Crestor  GERD -Continue pharmacy substitution of Protonix for Pepcid  Marijuana use Patient reports daily use of marijuana.  Suspect patient routine use of marijuana could be a contributing factor for his symptoms of nausea and vomiting. -Counseled patient that symptoms could be related to his frequent use of marijuana  DVT prophylaxis: Lovenox Advance Care Planning:   Code Status: Full Code   Consults:   Family Communication: none  Severity of Illness: The appropriate patient status for this patient is OBSERVATION. Observation status is judged to be reasonable and necessary in order to provide the required intensity of service to ensure the patient's safety. The patient's presenting symptoms, physical exam findings, and initial radiographic and laboratory data in the context of their medical condition is felt to place them at decreased risk for further clinical deterioration. Furthermore, it is anticipated that the patient will be medically stable for discharge from the hospital within 2 midnights of admission.   Author: Clydie Braun, MD 03/12/2023 8:35 AM  For on call review [email or web address].

## 2023-03-12 NOTE — ED Notes (Signed)
[facility] called for transport , No ETA... ED Nurse will call floor for report Called @ 00:46

## 2023-03-12 NOTE — Plan of Care (Signed)
  Problem: Education: Goal: Knowledge of General Education information will improve Description: Including pain rating scale, medication(s)/side effects and non-pharmacologic comfort measures Outcome: Progressing   Problem: Health Behavior/Discharge Planning: Goal: Ability to manage health-related needs will improve Outcome: Progressing   Problem: Clinical Measurements: Goal: Ability to maintain clinical measurements within normal limits will improve Outcome: Progressing Goal: Will remain free from infection Outcome: Progressing Goal: Diagnostic test results will improve Outcome: Progressing Goal: Respiratory complications will improve Outcome: Progressing Goal: Cardiovascular complication will be avoided Outcome: Progressing   Problem: Activity: Goal: Risk for activity intolerance will decrease Outcome: Progressing   Problem: Nutrition: Goal: Adequate nutrition will be maintained Outcome: Progressing   Problem: Elimination: Goal: Will not experience complications related to bowel motility Outcome: Progressing Goal: Will not experience complications related to urinary retention Outcome: Progressing   Problem: Pain Management: Goal: General experience of comfort will improve Outcome: Progressing   Problem: Safety: Goal: Ability to remain free from injury will improve Outcome: Progressing   Problem: Skin Integrity: Goal: Risk for impaired skin integrity will decrease Outcome: Progressing

## 2023-03-12 NOTE — ED Notes (Signed)
Report called to April RN @ [facility].

## 2023-03-12 NOTE — Care Management Obs Status (Signed)
MEDICARE OBSERVATION STATUS NOTIFICATION   Patient Details  Name: Benjamin Valdez MRN: [id number] Date of Birth: 1960/11/21   Medicare Observation Status Notification Given:  Yes    Darrold Span, RN 03/12/2023, 2:48 PM

## 2023-03-12 NOTE — Progress Notes (Signed)
Patient arrived to unit with significant other at the bedside. Patient A&O x4, Vitals stable. Admissions paged and made aware of patients arrival.

## 2023-03-13 ENCOUNTER — Inpatient Hospital Stay (HOSPITAL_BASED_OUTPATIENT_CLINIC_OR_DEPARTMENT_OTHER): Payer: Medicare HMO

## 2023-03-13 DIAGNOSIS — Z89512 Acquired absence of left leg below knee: Secondary | ICD-10-CM | POA: Diagnosis not present

## 2023-03-13 DIAGNOSIS — Z85820 Personal history of malignant melanoma of skin: Secondary | ICD-10-CM | POA: Diagnosis not present

## 2023-03-13 DIAGNOSIS — E876 Hypokalemia: Secondary | ICD-10-CM | POA: Diagnosis present

## 2023-03-13 DIAGNOSIS — Z85831 Personal history of malignant neoplasm of soft tissue: Secondary | ICD-10-CM | POA: Diagnosis not present

## 2023-03-13 DIAGNOSIS — R112 Nausea with vomiting, unspecified: Secondary | ICD-10-CM | POA: Diagnosis present

## 2023-03-13 DIAGNOSIS — E119 Type 2 diabetes mellitus without complications: Secondary | ICD-10-CM | POA: Diagnosis present

## 2023-03-13 DIAGNOSIS — R079 Chest pain, unspecified: Secondary | ICD-10-CM | POA: Diagnosis not present

## 2023-03-13 DIAGNOSIS — K219 Gastro-esophageal reflux disease without esophagitis: Secondary | ICD-10-CM | POA: Diagnosis present

## 2023-03-13 DIAGNOSIS — F1210 Cannabis abuse, uncomplicated: Secondary | ICD-10-CM | POA: Diagnosis present

## 2023-03-13 DIAGNOSIS — R9431 Abnormal electrocardiogram [ECG] [EKG]: Secondary | ICD-10-CM | POA: Diagnosis not present

## 2023-03-13 DIAGNOSIS — I2489 Other forms of acute ischemic heart disease: Secondary | ICD-10-CM | POA: Diagnosis present

## 2023-03-13 DIAGNOSIS — I10 Essential (primary) hypertension: Secondary | ICD-10-CM | POA: Diagnosis present

## 2023-03-13 DIAGNOSIS — Z7982 Long term (current) use of aspirin: Secondary | ICD-10-CM | POA: Diagnosis not present

## 2023-03-13 DIAGNOSIS — R7989 Other specified abnormal findings of blood chemistry: Secondary | ICD-10-CM | POA: Diagnosis not present

## 2023-03-13 DIAGNOSIS — E872 Acidosis, unspecified: Secondary | ICD-10-CM | POA: Diagnosis present

## 2023-03-13 DIAGNOSIS — Z7984 Long term (current) use of oral hypoglycemic drugs: Secondary | ICD-10-CM | POA: Diagnosis not present

## 2023-03-13 DIAGNOSIS — Z79899 Other long term (current) drug therapy: Secondary | ICD-10-CM | POA: Diagnosis not present

## 2023-03-13 DIAGNOSIS — K449 Diaphragmatic hernia without obstruction or gangrene: Secondary | ICD-10-CM | POA: Diagnosis present

## 2023-03-13 DIAGNOSIS — E7800 Pure hypercholesterolemia, unspecified: Secondary | ICD-10-CM | POA: Diagnosis present

## 2023-03-13 DIAGNOSIS — Z8052 Family history of malignant neoplasm of bladder: Secondary | ICD-10-CM | POA: Diagnosis not present

## 2023-03-13 DIAGNOSIS — F419 Anxiety disorder, unspecified: Secondary | ICD-10-CM | POA: Diagnosis present

## 2023-03-13 DIAGNOSIS — I493 Ventricular premature depolarization: Secondary | ICD-10-CM | POA: Diagnosis present

## 2023-03-13 LAB — BASIC METABOLIC PANEL
Anion gap: 8 (ref 5–15)
BUN: 19 mg/dL (ref 8–23)
CO2: 25 mmol/L (ref 22–32)
Calcium: 8.6 mg/dL — ABNORMAL LOW (ref 8.9–10.3)
Chloride: 108 mmol/L (ref 98–111)
Creatinine, Ser: 1.09 mg/dL (ref 0.61–1.24)
GFR, Estimated: >60 mL/min (ref >60)
Glucose, Bld: 94 mg/dL (ref 70–99)
Potassium: 3.3 mmol/L — ABNORMAL LOW (ref 3.5–5.1)
Sodium: 141 mmol/L (ref 135–145)

## 2023-03-13 LAB — ECHOCARDIOGRAM COMPLETE
AR max vel: 2.66 cm2
AV Area VTI: 2.7 cm2
AV Area mean vel: 2.66 cm2
AV Mean grad: 4 mm[Hg]
AV Peak grad: 7.4 mm[Hg]
Ao pk vel: 1.36 m/s
Area-P 1/2: 4.49 cm2
Height: 72 in
S' Lateral: 2.5 cm
Weight: 2606.72 [oz_av]

## 2023-03-13 LAB — CBC
HCT: 35.1 % — ABNORMAL LOW (ref 39.0–52.0)
Hemoglobin: 11.4 g/dL — ABNORMAL LOW (ref 13.0–17.0)
MCH: 28.9 pg (ref 26.0–34.0)
MCHC: 32.5 g/dL (ref 30.0–36.0)
MCV: 89.1 fL (ref 80.0–100.0)
Platelets: 229 10*3/uL (ref 150–400)
RBC: 3.94 MIL/uL — ABNORMAL LOW (ref 4.22–5.81)
RDW: 13.7 % (ref 11.5–15.5)
WBC: 4.9 10*3/uL (ref 4.0–10.5)
nRBC: 0 % (ref 0.0–0.2)

## 2023-03-13 LAB — MAGNESIUM: Magnesium: 1.9 mg/dL (ref 1.7–2.4)

## 2023-03-13 LAB — RAPID URINE DRUG SCREEN, HOSP PERFORMED
Amphetamines: NOT DETECTED
Barbiturates: NOT DETECTED
Benzodiazepines: POSITIVE — AB
Cocaine: NOT DETECTED
Opiates: POSITIVE — AB
Tetrahydrocannabinol: POSITIVE — AB

## 2023-03-13 MED ORDER — POTASSIUM CHLORIDE CRYS ER 20 MEQ PO TBCR
40.0000 meq | EXTENDED_RELEASE_TABLET | Freq: Once | ORAL | Status: AC
  Administered 2023-03-13: 40 meq via ORAL
  Filled 2023-03-13: qty 2

## 2023-03-13 MED ORDER — PANTOPRAZOLE SODIUM 40 MG IV SOLR
40.0000 mg | Freq: Two times a day (BID) | INTRAVENOUS | Status: DC
  Administered 2023-03-13 – 2023-03-16 (×7): 40 mg via INTRAVENOUS
  Filled 2023-03-13 (×7): qty 10

## 2023-03-13 MED ORDER — HYDROMORPHONE HCL 1 MG/ML IJ SOLN
0.5000 mg | INTRAMUSCULAR | Status: DC | PRN
  Administered 2023-03-13 – 2023-03-16 (×10): 0.5 mg via INTRAVENOUS
  Filled 2023-03-13 (×10): qty 0.5

## 2023-03-13 MED ORDER — DOXYCYCLINE HYCLATE 100 MG IV SOLR
100.0000 mg | Freq: Two times a day (BID) | INTRAVENOUS | Status: DC
  Administered 2023-03-13 – 2023-03-16 (×6): 100 mg via INTRAVENOUS
  Filled 2023-03-13 (×7): qty 100

## 2023-03-13 MED ORDER — ONDANSETRON HCL 4 MG/2ML IJ SOLN
4.0000 mg | Freq: Four times a day (QID) | INTRAMUSCULAR | Status: DC | PRN
  Administered 2023-03-13: 4 mg via INTRAVENOUS
  Filled 2023-03-13: qty 2

## 2023-03-13 MED ORDER — PROMETHAZINE HCL 25 MG RE SUPP: 25.0000 mg | Freq: Four times a day (QID) | RECTAL | 0 refills | Status: DC | PRN

## 2023-03-13 MED ORDER — LACTATED RINGERS IV SOLN: INTRAVENOUS | Status: AC

## 2023-03-13 MED ORDER — ONDANSETRON HCL 4 MG/2ML IJ SOLN
4.0000 mg | Freq: Once | INTRAMUSCULAR | Status: AC
  Administered 2023-03-13: 4 mg via INTRAVENOUS
  Filled 2023-03-13: qty 2

## 2023-03-13 MED ORDER — PROCHLORPERAZINE EDISYLATE 10 MG/2ML IJ SOLN
10.0000 mg | Freq: Four times a day (QID) | INTRAMUSCULAR | Status: AC
  Administered 2023-03-13 – 2023-03-14 (×4): 10 mg via INTRAVENOUS
  Filled 2023-03-13 (×4): qty 2

## 2023-03-13 MED ORDER — PROMETHAZINE HCL 25 MG PO TABS: 25.0000 mg | ORAL_TABLET | Freq: Four times a day (QID) | ORAL | 0 refills | Status: DC | PRN

## 2023-03-13 NOTE — Progress Notes (Addendum)
[facility]                                                                              Benjamin Valdez, is a 62 y.o. male, DOB - Mar 05, 1961, BMW:[id number] Admit date - 03/11/2023    Outpatient Primary MD for the patient is Baxley, [name], MD  LOS - 0  days  Chief Complaint  Patient presents with   Abdominal Pain   Emesis       Brief summary   Patient is a 62 year old male with angiosarcoma left leg, status post BKA, hiatal hernia, gastritis, marijuana use presented with nausea and vomiting over the last 4 days prior to admission.  Patient reported pain in the middle of his abdomen, no hematemesis. Seen at [facility].  CT on 11/18 showed no acute intra-abdominal pathology.  UDS positive for marijuana benzodiazepines and opiates  Patient presented third time to ED with fever of 101.1 F, nausea vomiting, some chest discomfort due to vomiting.  Patient had revision of his BKA approximately 2 weeks ago.  He is followed by [facility] orthopedics and was recently prescribed a course of doxycycline, however could not tolerated due to nausea and vomiting.  He does admits to using marijuana on daily basis.  In ED, afebrile, BP elevated 177/106, mild tachypnea  CBC within normal limits, potassium 3.3, BUN 28, creatinine 1.05, lactic acid 2-> 1.2,  troponin 71->79.   CT scan of the head did not note any acute abnormality.  CT scan of the chest abdomen pelvis noted no acute abnormality with mild fatty infiltration of the liver and aortic atherosclerosis.   Patient had been given 1 L lactated Ringer's, Zofran 4 mg IV, and morphine 4 mg IV.  Admitted for further workup.   Assessment & Plan    Principal Problem:   Intractable nausea and vomiting, acute -Recent UDS positive for benzodiazepines, opiates and marijuana last checked on 11/18 and admits to daily THC use -Differentials include cannabinoid hyperemesis syndrome versus gastroenteritis -CT abdomen did not show  acute intraabdominal -Patient was placed on IV fluid hydration, he has been tolerating clear liquid diet without any difficulty, -Started vomiting after soft diet, changed to clears -Continue PPI, antiemetics -Placed on scheduled Compazine IV, continue Tigan as needed  Active Problems:   Elevated troponin, PVCs -No chest pain or acute shortness of breath, 2D echo pending     Hypokalemia, hypomagnesemia -Replaced     History of sarcoma with recent BKA -Patient with prior history of 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  Wound appears to be healing and patient has been on doxycycline for treatment.  Records note he was last seen by Dr. Lajoyce Corners in the office on 11/15 with plan for reevaluating to remove sutures in 2 weeks. -Continue doxycycline, outpatient follow-up with Dr. Lajoyce Corners    Prolonged QT interval -Avoid QT prolonging meds -Potassium, magnesium replaced    Controlled type 2 diabetes mellitus without complication, without long-term current use of insulin (HCC) -Patient had been on metformin, appears to  have been discontinued -Hemoglobin A1c 6.0   Anxiety -Continue Prozac and Xanax as needed   Hyperlipidemia -Continue Crestor   GERD -Continue pharmacy substitution of Protonix for Pepcid   Marijuana use Patient reports daily use of marijuana.  Suspect patient routine use of marijuana could be a contributing factor for his symptoms of nausea and vomiting. -Counseled patient that symptoms could be related to his frequent use of marijuana   Estimated body mass index is 22.1 kg/m as calculated from the following:   Height as of this encounter: 6' (1.829 m).   Weight as of this encounter: 73.9 kg.  Code Status: Full CODE STATUS DVT Prophylaxis:  enoxaparin (LOVENOX) injection 40 mg Start: 03/12/23 1000   Level of Care: Level of care: Telemetry  Cardiac Family Communication: Updated patient's significant other on the speaker phone Disposition Plan:      Remains inpatient appropriate:   Procedures:    Consultants:     Antimicrobials:   Anti-infectives (From admission, onward)    Start     Dose/Rate Route Frequency Ordered Stop   03/12/23 1230  doxycycline (VIBRA-TABS) tablet 100 mg       Note to Pharmacy: X14 days     100 mg Oral 2 times daily 03/12/23 1137 03/26/23 0959          Medications  aspirin EC  81 mg Oral Daily   brexpiprazole  1 mg Oral q morning   doxycycline  100 mg Oral BID   enoxaparin (LOVENOX) injection  40 mg Subcutaneous Q24H   FLUoxetine  60 mg Oral Daily   pantoprazole  40 mg Oral Daily   QUEtiapine  150 mg Oral QHS   rosuvastatin  20 mg Oral Daily   sodium chloride flush  3 mL Intravenous Q12H      Subjective:   Benjamin Valdez was seen and examined today.  This morning felt a lot better and wanted the diet to be advanced to soft solids however subsequently started having nausea and vomiting.  No acute dizziness, chest pain, shortness of breath, abdominal pain.   Objective:   Vitals:   03/12/23 1603 03/12/23 2253 03/13/23 0434 03/13/23 0748  BP: (!) 140/90 137/88 (!) 132/92 (!) 130/93  Pulse: 67  65 66  Resp: 15 20 18    Temp: 99.2 F (37.3 C) 98.4 F (36.9 C) (!) 97.4 F (36.3 C) 97.8 F (36.6 C)  TempSrc: Oral Oral Oral Oral  SpO2: 95% 95% 96%   Weight:      Height:        Intake/Output Summary (Last 24 hours) at 03/13/2023 1014 Last data filed at 03/13/2023 0855 Gross per 24 hour  Intake 345 ml  Output 450 ml  Net -105 ml     Wt Readings from Last 3 Encounters:  03/12/23 73.9 kg  03/09/23 76.4 kg  03/08/23 76.4 kg     Exam General: Alert and oriented x 3, NAD Cardiovascular: S1 S2 auscultated,  RRR Respiratory: Clear to auscultation bilaterally Gastrointestinal: Soft, nontender, nondistended, + bowel sounds Ext: no pedal edema bilaterally Neuro: no  new deficits Skin: No rashes Psych: Normal affect     Data Reviewed:  I have personally reviewed following labs    CBC Lab Results  Component Value Date   WBC 4.9 03/13/2023   RBC 3.94 (L) 03/13/2023   HGB 11.4 (L) 03/13/2023   HCT 35.1 (L) 03/13/2023   MCV 89.1 03/13/2023   MCH 28.9 03/13/2023   PLT 229 03/13/2023  MCHC 32.5 03/13/2023   RDW 13.7 03/13/2023   LYMPHSABS 0.4 (L) 03/11/2023   MONOABS 0.3 03/11/2023   EOSABS 0.0 03/11/2023   BASOSABS 0.0 03/11/2023     Last metabolic panel Lab Results  Component Value Date   NA 141 03/13/2023   K 3.3 (L) 03/13/2023   CL 108 03/13/2023   CO2 25 03/13/2023   BUN 19 03/13/2023   CREATININE 1.09 03/13/2023   GLUCOSE 94 03/13/2023   GFRNONAA >60 03/13/2023   GFRAA 67 12/29/2019   CALCIUM 8.6 (L) 03/13/2023   PROT 7.9 03/11/2023   ALBUMIN 4.2 03/11/2023   BILITOT 1.2 (H) 03/11/2023   ALKPHOS 79 03/11/2023   AST 27 03/11/2023   ALT 21 03/11/2023   ANIONGAP 8 03/13/2023    CBG (last 3)  No results for input(s): "GLUCAP" in the last 72 hours.    Coagulation Profile: No results for input(s): "INR", "PROTIME" in the last 168 hours.   Radiology Studies: I have personally reviewed the imaging studies  CT ABDOMEN PELVIS W CONTRAST  Result Date: 03/11/2023 CLINICAL DATA:  Abdominal pain. EXAM: CT ABDOMEN AND PELVIS WITH CONTRAST TECHNIQUE: Multidetector CT imaging of the abdomen and pelvis was performed using the standard protocol following bolus administration of intravenous contrast. RADIATION DOSE REDUCTION: This exam was performed according to the departmental dose-optimization program which includes automated exposure control, adjustment of the mA and/or kV according to patient size and/or use of iterative reconstruction technique. CONTRAST:  OMNIPAQUE IOHEXOL 300 MG/ML  SOLN COMPARISON:  CT abdomen pelvis dated 03/08/2023. FINDINGS: Lower chest: Right lung base atelectasis/scarring. The visualized lung bases  are otherwise clear. No intra-abdominal free air or free fluid. Hepatobiliary: Small cyst in the left lobe of the liver. There is apparent mild fatty infiltration of the liver. No biliary dilatation. The gallbladder is unremarkable Pancreas: Unremarkable. No pancreatic ductal dilatation or surrounding inflammatory changes. Spleen: Normal in size without focal abnormality. Adrenals/Urinary Tract: The adrenal glands unremarkable. There is no hydronephrosis on either side. Small left parapelvic cysts. There is symmetric enhancement and excretion of contrast by both kidneys. The visualized ureters and urinary bladder appear unremarkable. Stomach/Bowel: There is no bowel obstruction or active inflammation. Appendectomy. Vascular/Lymphatic: Moderate aortoiliac atherosclerotic disease. The IVC is unremarkable. No portal venous gas. There is no adenopathy. Reproductive: The prostate and seminal vesicles are grossly unremarkable. No pelvic mass. Partial visualized bilateral hydroceles. Other: None Musculoskeletal: Degenerative changes of the spine. No acute osseous pathology. IMPRESSION: 1. No acute intra-abdominal or pelvic pathology. 2. Mild fatty infiltration of the liver. 3.  Aortic Atherosclerosis (ICD10-I70.0). Electronically Signed   By: Elgie Collard M.D.   On: 03/11/2023 21:08   CT Head Wo Contrast  Result Date: 03/11/2023 CLINICAL DATA:  Headache, increasing frequency or severity EXAM: CT HEAD WITHOUT CONTRAST TECHNIQUE: Contiguous axial images were obtained from the base of the skull through the vertex without intravenous contrast. RADIATION DOSE REDUCTION: This exam was performed according to the departmental dose-optimization program which includes automated exposure control, adjustment of the mA and/or kV according to patient size and/or use of iterative reconstruction technique. COMPARISON:  Head CT 02/12/2012 FINDINGS: Brain: No intracranial hemorrhage, mass effect, or midline shift. No hydrocephalus.  The basilar cisterns are patent. No evidence of territorial infarct or acute ischemia. No extra-axial or intracranial fluid collection. Vascular: No hyperdense vessel or unexpected calcification. Skull: No fracture or focal lesion. Sinuses/Orbits: Mild mucosal thickening of the maxillary sinuses. No sinus fluid levels. The mastoid air cells are clear.  Other: None available. IMPRESSION: 1. No acute intracranial abnormality. 2. Mild mucosal thickening of the maxillary sinuses. Electronically Signed   By: Narda Rutherford M.D.   On: 03/11/2023 21:00       Marvel Mcphillips M.D. [facility] 03/13/2023, 10:14 AM  Available via Epic secure chat 7am-7pm After 7 pm, please refer to night coverage provider listed on amion.

## 2023-03-14 DIAGNOSIS — R112 Nausea with vomiting, unspecified: Secondary | ICD-10-CM | POA: Diagnosis not present

## 2023-03-14 DIAGNOSIS — E876 Hypokalemia: Secondary | ICD-10-CM | POA: Diagnosis not present

## 2023-03-14 DIAGNOSIS — R7989 Other specified abnormal findings of blood chemistry: Secondary | ICD-10-CM | POA: Diagnosis not present

## 2023-03-14 DIAGNOSIS — I493 Ventricular premature depolarization: Secondary | ICD-10-CM | POA: Diagnosis not present

## 2023-03-14 LAB — CBC
HCT: 33.2 % — ABNORMAL LOW (ref 39.0–52.0)
Hemoglobin: 11.5 g/dL — ABNORMAL LOW (ref 13.0–17.0)
MCH: 30 pg (ref 26.0–34.0)
MCHC: 34.6 g/dL (ref 30.0–36.0)
MCV: 86.7 fL (ref 80.0–100.0)
Platelets: 257 10*3/uL (ref 150–400)
RBC: 3.83 MIL/uL — ABNORMAL LOW (ref 4.22–5.81)
RDW: 13.4 % (ref 11.5–15.5)
WBC: 6 10*3/uL (ref 4.0–10.5)
nRBC: 0 % (ref 0.0–0.2)

## 2023-03-14 LAB — BASIC METABOLIC PANEL
Anion gap: 10 (ref 5–15)
BUN: 13 mg/dL (ref 8–23)
CO2: 24 mmol/L (ref 22–32)
Calcium: 8.7 mg/dL — ABNORMAL LOW (ref 8.9–10.3)
Chloride: 104 mmol/L (ref 98–111)
Creatinine, Ser: 1.13 mg/dL (ref 0.61–1.24)
GFR, Estimated: >60 mL/min (ref >60)
Glucose, Bld: 111 mg/dL — ABNORMAL HIGH (ref 70–99)
Potassium: 3.2 mmol/L — ABNORMAL LOW (ref 3.5–5.1)
Sodium: 138 mmol/L (ref 135–145)

## 2023-03-14 LAB — CULTURE, BLOOD (ROUTINE X 2)
Culture: NO GROWTH
Culture: NO GROWTH
Special Requests: ADEQUATE
Special Requests: ADEQUATE

## 2023-03-14 MED ORDER — POTASSIUM CHLORIDE 10 MEQ/100ML IV SOLN
10.0000 meq | INTRAVENOUS | Status: AC
  Administered 2023-03-14 (×3): 10 meq via INTRAVENOUS
  Filled 2023-03-14 (×3): qty 100

## 2023-03-14 MED ORDER — POTASSIUM CHLORIDE CRYS ER 20 MEQ PO TBCR: 40.0000 meq | EXTENDED_RELEASE_TABLET | Freq: Once | ORAL | Status: DC

## 2023-03-14 MED ORDER — CAPSAICIN 0.025 % EX CREA
TOPICAL_CREAM | Freq: Two times a day (BID) | CUTANEOUS | Status: DC
  Filled 2023-03-14: qty 60

## 2023-03-14 NOTE — Progress Notes (Signed)
[facility]                                                                              Benjamin Valdez, is a 62 y.o. male, DOB - 1960-04-27, ZOX:[id number] Admit date - 03/11/2023    Outpatient Primary MD for the patient is Baxley, [name], MD  LOS - 1  days  Chief Complaint  Patient presents with   Abdominal Pain   Emesis       Brief summary   Patient is a 62 year old male with angiosarcoma left leg, status post BKA, hiatal hernia, gastritis, marijuana use presented with nausea and vomiting over the last 4 days prior to admission.  Patient reported pain in the middle of his abdomen, no hematemesis. Seen at [facility].  CT on 11/18 showed no acute intra-abdominal pathology.  UDS positive for marijuana benzodiazepines and opiates  Patient presented third time to ED with fever of 101.1 F, nausea vomiting, some chest discomfort due to vomiting.  Patient had revision of his BKA approximately 2 weeks ago.  He is followed by [facility] orthopedics and was recently prescribed a course of doxycycline, however could not tolerated due to nausea and vomiting.  He does admits to using marijuana on daily basis.  In ED, afebrile, BP elevated 177/106, mild tachypnea  CBC within normal limits, potassium 3.3, BUN 28, creatinine 1.05, lactic acid 2-> 1.2,  troponin 71->79.   CT scan of the head did not note any acute abnormality.  CT scan of the chest abdomen pelvis noted no acute abnormality with mild fatty infiltration of the liver and aortic atherosclerosis.   Patient had been given 1 L lactated Ringer's, Zofran 4 mg IV, and morphine 4 mg IV.  Admitted for further workup.   Assessment & Plan    Principal Problem:   Intractable nausea and vomiting, acute -Likely cannabinoid hyperemesis syndrome although gastroenteritis also in differential. -UDS 11/23 benzo diazepam's, opiates and THC -CT abdomen did not show acute intraabdominal or pelvic pathology, mild fatty  infiltration of liver -Continue IV fluid hydration, states nausea vomiting better with scheduled Compazine and Zofran, Tigan as needed  -will advance to full liquid diet today and soft solids tomorrow if tolerates full liquid diet well today -Continue PPI, antiemetics  Active Problems:   Elevated troponin, PVCs -No chest pain or acute shortness of breath -2D echo showed EF of 60 to 65%, diastolic parameters normal     Hypokalemia, hypomagnesemia -Replace with IV potassium -Per patient, p.o. 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  Wound appears to be healing and patient has been on doxycycline for treatment.  Records note he was last seen by Dr. Lajoyce Corners in the office on 11/15 with plan for reevaluating to remove sutures in 2 weeks. -Continue doxycycline, changed to IV until nausea vomiting improved  -Outpatient follow-up with Dr. Lajoyce Corners    Prolonged QT interval -Avoid QT  prolonging meds -Placed on KCl IV 10 mEq x 3    Controlled type 2 diabetes mellitus without complication, without long-term current use of insulin (HCC) -Patient had been on metformin, appears to have been discontinued -Hemoglobin A1c 6.0   Anxiety -Continue Prozac and Xanax as needed   Hyperlipidemia -Continue Crestor   GERD -Continue pharmacy substitution of Protonix for Pepcid   Marijuana use Patient reports daily use of marijuana.  Suspect patient routine use of marijuana could be a contributing factor for his symptoms of nausea and vomiting. -UDS positive for THC, likely causing hyperemesis syndrome  -counseled patient strongly on THC cessation   Estimated body mass index is 22.1 kg/m as calculated from the following:   Height as of this encounter: 6' (1.829 m).    Weight as of this encounter: 73.9 kg.  Code Status: Full CODE STATUS DVT Prophylaxis:  enoxaparin (LOVENOX) injection 40 mg Start: 03/12/23 1000   Level of Care: Level of care: Telemetry Cardiac Family Communication: Updated patient's significant other at the bedside today Disposition Plan:      Remains inpatient appropriate:   Procedures:    Consultants:     Antimicrobials:   Anti-infectives (From admission, onward)    Start     Dose/Rate Route Frequency Ordered Stop   03/13/23 1545  doxycycline (VIBRAMYCIN) 100 mg in dextrose 5 % 250 mL IVPB        100 mg 125 mL/hr over 120 Minutes Intravenous Every 12 hours 03/13/23 1446     03/12/23 1230  doxycycline (VIBRA-TABS) tablet 100 mg  Status:  Discontinued       Note to Pharmacy: X14 days     100 mg Oral 2 times daily 03/12/23 1137 03/13/23 1446          Medications  aspirin EC  81 mg Oral Daily   brexpiprazole  1 mg Oral q morning   capsaicin   Topical BID   enoxaparin (LOVENOX) injection  40 mg Subcutaneous Q24H   FLUoxetine  60 mg Oral Daily   pantoprazole (PROTONIX) IV  40 mg Intravenous Q12H   QUEtiapine  150 mg Oral QHS   rosuvastatin  20 mg Oral Daily   sodium chloride flush  3 mL Intravenous Q12H      Subjective:   Benjamin Valdez was seen and examined today.  This morning feeling better okay with advancing diet to full liquids.  No acute nausea vomiting, chest pain or shortness of breath.  No fevers.  Objective:   Vitals:   03/13/23 2158 03/13/23 2318 03/14/23 0455 03/14/23 0733  BP:  118/87 114/82 128/87  Pulse:  74 60 80  Resp:  (!) 24 20 16   Temp: 99.2 F (37.3 C) 99.3 F (37.4 C) 98.3 F (36.8 C) 98.1 F (36.7 C)  TempSrc: Oral Oral Oral Oral  SpO2:  94% 91% 94%  Weight:      Height:        Intake/Output Summary (Last 24 hours) at 03/14/2023 1034 Last data filed at 03/14/2023 0735 Gross per 24 hour  Intake 2840 ml  Output --  Net 2840 ml     Wt Readings from Last 3  Encounters:  03/12/23 73.9 kg  03/09/23 76.4 kg  03/08/23 76.4 kg    Physical Exam General: Alert and oriented x 3, NAD Cardiovascular: S1 S2 clear, RRR.  Respiratory: CTAB, Gastrointestinal: Soft, NT, ND, NBS  Ext: no pedal edema bilaterally Neuro: no new deficits Psych: Normal affect, pleasant    Data  Reviewed:  I have personally reviewed following labs    CBC Lab Results  Component Value Date   WBC 6.0 03/14/2023   RBC 3.83 (L) 03/14/2023   HGB 11.5 (L) 03/14/2023   HCT 33.2 (L) 03/14/2023   MCV 86.7 03/14/2023   MCH 30.0 03/14/2023   PLT 257 03/14/2023   MCHC 34.6 03/14/2023   RDW 13.4 03/14/2023   LYMPHSABS 0.4 (L) 03/11/2023   MONOABS 0.3 03/11/2023   EOSABS 0.0 03/11/2023   BASOSABS 0.0 03/11/2023     Last metabolic panel Lab Results  Component Value Date   NA 138 03/14/2023   K 3.2 (L) 03/14/2023   CL 104 03/14/2023   CO2 24 03/14/2023   BUN 13 03/14/2023   CREATININE 1.13 03/14/2023   GLUCOSE 111 (H) 03/14/2023   GFRNONAA >60 03/14/2023   GFRAA 67 12/29/2019   CALCIUM 8.7 (L) 03/14/2023   PROT 7.9 03/11/2023   ALBUMIN 4.2 03/11/2023   BILITOT 1.2 (H) 03/11/2023   ALKPHOS 79 03/11/2023   AST 27 03/11/2023   ALT 21 03/11/2023   ANIONGAP 10 03/14/2023    CBG (last 3)  No results for input(s): "GLUCAP" in the last 72 hours.    Coagulation Profile: No results for input(s): "INR", "PROTIME" in the last 168 hours.   Radiology Studies: I have personally reviewed the imaging studies  ECHOCARDIOGRAM COMPLETE  Result Date: 03/13/2023    ECHOCARDIOGRAM REPORT   Patient Name:   STEFFEN DOHN Date of Exam: 03/13/2023 Medical Rec #:  [id number]          Height:       72.0 in Accession #:    [id number]         Weight:       162.9 lb Date of Birth:  01-11-1961          BSA:          1.953 m Patient Age:    62 years           BP:           130/93 mmHg Patient Gender: M                  HR:           64 bpm. Exam Location:  Inpatient Procedure: 2D  Echo, Color Doppler and Cardiac Doppler Indications:    Elevated troponin  History:        Patient has no prior history of Echocardiogram examinations.                 Elevated Troponin; Risk Factors:Marijuana Use, Diabetes and                 Dyslipidemia.  Sonographer:    Milbert Coulter Referring Phys: [id number] RONDELL A SMITH  Sonographer Comments: No subcostal window. Exam had to be stopped multiple times due to patient's significant nausea. Patient could not tolerate any subcostal imaging. IMPRESSIONS  1. Left ventricular ejection fraction, by estimation, is 60 to 65%. The left ventricle has normal function. Left ventricular endocardial border not optimally defined to evaluate regional wall motion. Left ventricular diastolic parameters were normal.  2. Right ventricular systolic function is hyperdynamic. The right ventricular size is normal. Tricuspid regurgitation signal is inadequate for assessing PA pressure.  3. Left atrial size was mildly dilated.  4. The mitral valve is normal in structure. Trivial mitral valve regurgitation. No evidence of mitral stenosis.  5. The aortic valve was not well  visualized. Aortic valve regurgitation is not visualized. Aortic valve sclerosis/calcification is present, without any evidence of aortic stenosis. Comparison(s): No prior Echocardiogram. FINDINGS  Left Ventricle: Left ventricular ejection fraction, by estimation, is 60 to 65%. The left ventricle has normal function. Left ventricular endocardial border not optimally defined to evaluate regional wall motion. The left ventricular internal cavity size was normal in size. There is no left ventricular hypertrophy. Left ventricular diastolic parameters were normal. Right Ventricle: The right ventricular size is normal. No increase in right ventricular wall thickness. Right ventricular systolic function is hyperdynamic. Tricuspid regurgitation signal is inadequate for assessing PA pressure. Left Atrium: Left atrial size was  mildly dilated. Right Atrium: Right atrial size was normal in size. Pericardium: There is no evidence of pericardial effusion. Mitral Valve: The mitral valve is normal in structure. Trivial mitral valve regurgitation. No evidence of mitral valve stenosis. Tricuspid Valve: The tricuspid valve is normal in structure. Tricuspid valve regurgitation is not demonstrated. No evidence of tricuspid stenosis. Aortic Valve: The aortic valve was not well visualized. Aortic valve regurgitation is not visualized. Aortic valve sclerosis/calcification is present, without any evidence of aortic stenosis. Aortic valve mean gradient measures 4.0 mmHg. Aortic valve peak gradient measures 7.4 mmHg. Aortic valve area, by VTI measures 2.70 cm. Pulmonic Valve: The pulmonic valve was not well visualized. Pulmonic valve regurgitation is not visualized. No evidence of pulmonic stenosis. Aorta: The aortic root is normal in size and structure. Venous: The inferior vena cava was not well visualized. IAS/Shunts: The interatrial septum was not well visualized.  LEFT VENTRICLE PLAX 2D LVIDd:         5.00 cm   Diastology LVIDs:         2.50 cm   LV e' medial:    10.60 cm/s LV PW:         1.10 cm   LV E/e' medial:  7.8 LV IVS:        0.90 cm   LV e' lateral:   21.80 cm/s LVOT diam:     2.20 cm   LV E/e' lateral: 3.8 LV SV:         70 LV SV Index:   36 LVOT Area:     3.80 cm  RIGHT VENTRICLE RV Basal diam:  3.00 cm RV Mid diam:    2.70 cm RV S prime:     23.90 cm/s TAPSE (M-mode): 2.7 cm LEFT ATRIUM           Index        RIGHT ATRIUM           Index LA diam:      3.40 cm 1.74 cm/m   RA Area:     15.70 cm LA Vol (A2C): 75.2 ml 38.51 ml/m  RA Volume:   36.80 ml  18.85 ml/m LA Vol (A4C): 22.3 ml 11.42 ml/m  AORTIC VALVE AV Area (Vmax):    2.66 cm AV Area (Vmean):   2.66 cm AV Area (VTI):     2.70 cm AV Vmax:           136.00 cm/s AV Vmean:          87.100 cm/s AV VTI:            0.260 m AV Peak Grad:      7.4 mmHg AV Mean Grad:      4.0 mmHg  LVOT Vmax:         95.10 cm/s LVOT Vmean:  60.900 cm/s LVOT VTI:          0.185 m LVOT/AV VTI ratio: 0.71  AORTA Ao Root diam: 3.70 cm MITRAL VALVE MV Area (PHT): 4.49 cm    SHUNTS MV Decel Time: 169 msec    Systemic VTI:  0.18 m MV E velocity: 82.30 cm/s  Systemic Diam: 2.20 cm MV A velocity: 63.50 cm/s MV E/A ratio:  1.30 Vishnu Priya Mallipeddi Electronically signed by Winfield Rast Mallipeddi Signature Date/Time: 03/13/2023/2:42:03 PM    Final        Thad Ranger M.D. [facility] 03/14/2023, 10:34 AM  Available via Epic secure chat 7am-7pm After 7 pm, please refer to night coverage provider listed on amion.

## 2023-03-14 NOTE — Progress Notes (Signed)
Pt has refused PO medications this shift d/t n/v. Pt states taking PO medications can "trigger" his gag reflex, which then takes abnormally long to relax again. Pt education provided on missing doses of medications. Pt aware, stating he will begin PO medications once he can tolerate.

## 2023-03-14 NOTE — Plan of Care (Signed)
  Problem: Education: Goal: Knowledge of General Education information will improve Description: Including pain rating scale, medication(s)/side effects and non-pharmacologic comfort measures Outcome: Progressing   Problem: Health Behavior/Discharge Planning: Goal: Ability to manage health-related needs will improve Outcome: Progressing   Problem: Clinical Measurements: Goal: Ability to maintain clinical measurements within normal limits will improve Outcome: Progressing Goal: Diagnostic test results will improve Outcome: Progressing   Problem: Activity: Goal: Risk for activity intolerance will decrease Outcome: Progressing   Problem: Nutrition: Goal: Adequate nutrition will be maintained Outcome: Progressing   Problem: Pain Management: Goal: General experience of comfort will improve Outcome: Progressing   Problem: Safety: Goal: Ability to remain free from injury will improve Outcome: Progressing   Problem: Skin Integrity: Goal: Risk for impaired skin integrity will decrease Outcome: Progressing

## 2023-03-15 DIAGNOSIS — R7989 Other specified abnormal findings of blood chemistry: Secondary | ICD-10-CM | POA: Diagnosis not present

## 2023-03-15 DIAGNOSIS — E876 Hypokalemia: Secondary | ICD-10-CM | POA: Diagnosis not present

## 2023-03-15 DIAGNOSIS — R112 Nausea with vomiting, unspecified: Secondary | ICD-10-CM | POA: Diagnosis not present

## 2023-03-15 DIAGNOSIS — I493 Ventricular premature depolarization: Secondary | ICD-10-CM | POA: Diagnosis not present

## 2023-03-15 LAB — BASIC METABOLIC PANEL
Anion gap: 7 (ref 5–15)
BUN: 11 mg/dL (ref 8–23)
CO2: 26 mmol/L (ref 22–32)
Calcium: 9 mg/dL (ref 8.9–10.3)
Chloride: 105 mmol/L (ref 98–111)
Creatinine, Ser: 1.19 mg/dL (ref 0.61–1.24)
GFR, Estimated: >60 mL/min (ref >60)
Glucose, Bld: 97 mg/dL (ref 70–99)
Potassium: 3.5 mmol/L (ref 3.5–5.1)
Sodium: 138 mmol/L (ref 135–145)

## 2023-03-15 LAB — CBC
HCT: 37.6 % — ABNORMAL LOW (ref 39.0–52.0)
Hemoglobin: 12.2 g/dL — ABNORMAL LOW (ref 13.0–17.0)
MCH: 29.2 pg (ref 26.0–34.0)
MCHC: 32.4 g/dL (ref 30.0–36.0)
MCV: 90 fL (ref 80.0–100.0)
Platelets: 249 10*3/uL (ref 150–400)
RBC: 4.18 MIL/uL — ABNORMAL LOW (ref 4.22–5.81)
RDW: 13.7 % (ref 11.5–15.5)
WBC: 6.9 10*3/uL (ref 4.0–10.5)
nRBC: 0 % (ref 0.0–0.2)

## 2023-03-15 NOTE — Progress Notes (Signed)
[facility]                                                                              Benjamin Valdez, is a 62 y.o. male, DOB - 12-May-1960, VWU:[id number] Admit date - 03/11/2023    Outpatient Primary MD for the patient is Valdez, Benjamin Cole, MD  LOS - 2  days  Chief Complaint  Patient presents with   Abdominal Pain   Emesis       Brief summary   Patient is a 62 year old male with angiosarcoma left leg, status post BKA, hiatal hernia, gastritis, marijuana use presented with nausea and vomiting over the last 4 days prior to admission.  Patient reported pain in the middle of his abdomen, no hematemesis. Seen at [facility].  CT on 11/18 showed no acute intra-abdominal pathology.  UDS positive for marijuana benzodiazepines and opiates  Patient presented third time to ED with fever of 101.1 F, nausea vomiting, some chest discomfort due to vomiting.  Patient had revision of his BKA approximately 2 weeks ago.  He is followed by [facility] orthopedics and was recently prescribed a course of doxycycline, however could not tolerated due to nausea and vomiting.  He does admits to using marijuana on daily basis.  In ED, afebrile, BP elevated 177/106, mild tachypnea  CBC within normal limits, potassium 3.3, BUN 28, creatinine 1.05, lactic acid 2-> 1.2,  troponin 71->79.   CT scan of the head did not note any acute abnormality.  CT scan of the chest abdomen pelvis noted no acute abnormality with mild fatty infiltration of the liver and aortic atherosclerosis.   Patient had been given 1 L lactated Ringer's, Zofran 4 mg IV, and morphine 4 mg IV.  Admitted for further workup.   Assessment & Plan    Principal Problem:   Intractable nausea and vomiting, acute -Likely cannabinoid hyperemesis syndrome although gastroenteritis also in differential. -UDS 11/23 benzo diazepam's, opiates and THC -CT abdomen did not show acute intraabdominal or pelvic pathology, mild fatty  infiltration of liver -Tolerated full liquid diet, advance to soft solids  -Continue Compazine, Zofran, Tigan as needed.   -Continue Protonix.   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  Wound appears to be healing and patient has been on doxycycline for treatment.  Records note he was last seen by Dr. Lajoyce Corners in the office on 11/15 with plan for reevaluating to remove sutures in 2 weeks. -Continue doxycycline IV -Outpatient follow-up with Dr. Lajoyce Corners    Prolonged QT interval -Avoid QT prolonging meds     Controlled type 2 diabetes mellitus without complication, without long-term current use of insulin (HCC) -Patient had been on metformin, appears to have been discontinued -Hemoglobin A1c 6.0   Anxiety -  Continue Prozac and Xanax as needed   Hyperlipidemia -Continue Crestor   GERD -Continue pharmacy substitution of Protonix for Pepcid   Marijuana use Patient reports daily use of marijuana.  Suspect patient routine use of marijuana could be a contributing factor for his symptoms of nausea and vomiting. -UDS positive for THC, likely causing hyperemesis syndrome  -counseled patient strongly on THC cessation   Estimated body mass index is 22.1 kg/m as calculated from the following:   Height as of this encounter: 6' (1.829 m).   Weight as of this encounter: 73.9 kg.  Code Status: Full CODE STATUS DVT Prophylaxis:  enoxaparin (LOVENOX) injection 40 mg Start: 03/12/23 1000   Level of Care: Level of care: Telemetry Cardiac Family Communication: Disposition Plan:      Remains inpatient appropriate:  Possible DC home tonight if able to tolerate supper otherwise tomorrow a.m.  Procedures:    Consultants:     Antimicrobials:   Anti-infectives (From admission, onward)    Start     Dose/Rate Route Frequency Ordered Stop   03/13/23 1545  doxycycline (VIBRAMYCIN) 100 mg in dextrose 5 % 250 mL IVPB        100 mg 125 mL/hr over 120 Minutes Intravenous Every 12 hours 03/13/23 1446     03/12/23 1230  doxycycline (VIBRA-TABS) tablet 100 mg  Status:  Discontinued       Note to Pharmacy: X14 days     100 mg Oral 2 times daily 03/12/23 1137 03/13/23 1446          Medications  aspirin EC  81 mg Oral Daily   brexpiprazole  1 mg Oral q morning   capsaicin   Topical BID   enoxaparin (LOVENOX) injection  40 mg Subcutaneous Q24H   FLUoxetine  60 mg Oral Daily   pantoprazole (PROTONIX) IV  40 mg Intravenous Q12H   QUEtiapine  150 mg Oral QHS   rosuvastatin  20 mg Oral Daily   sodium chloride flush  3 mL Intravenous Q12H      Subjective:   Benjamin Valdez was seen and examined today.  Feeling a lot better today, was able to tolerate full liquid diet, wants to try to soft solids.  No ongoing nausea or vomiting, chest pain or shortness of breath.    Objective:   Vitals:   03/14/23 2348 03/15/23 0358 03/15/23 0750 03/15/23 1133  BP: 121/84 (!) 121/90 108/72 105/75  Pulse: 65  73 82  Resp: 17 14    Temp: 98.9 F (37.2 C) 98.1 F (36.7 C) 98.4 F (36.9 C) 98.8 F (37.1 C)  TempSrc: Oral Oral Oral Oral  SpO2:  96% 97% 99%  Weight:      Height:        Intake/Output Summary (Last 24 hours) at 03/15/2023 1358 Last data filed at 03/15/2023 0750 Gross per 24 hour  Intake 570 ml  Output 600 ml  Net -30 ml     Wt Readings from Last 3 Encounters:  03/12/23 73.9 kg  03/09/23 76.4 kg  03/08/23 76.4 kg   Physical Exam General: Alert and oriented x 3, NAD Cardiovascular: S1 S2 clear, RRR.  Respiratory: CTAB, no wheezing Gastrointestinal: Soft, nontender, nondistended,  NBS Ext: no pedal edema bilaterally Neuro: no new deficits Psych: Normal affect   Data Reviewed:  I have personally reviewed following labs    CBC Lab Results  Component Value Date   WBC 6.9 03/15/2023   RBC 4.18 (L) 03/15/2023   HGB 12.2 (L)  03/15/2023   HCT 37.6 (L) 03/15/2023   MCV 90.0 03/15/2023   MCH 29.2 03/15/2023   PLT 249 03/15/2023   MCHC 32.4 03/15/2023   RDW 13.7 03/15/2023   LYMPHSABS 0.4 (L) 03/11/2023   MONOABS 0.3 03/11/2023   EOSABS 0.0 03/11/2023   BASOSABS 0.0 03/11/2023     Last metabolic panel Lab Results  Component Value Date   NA 138 03/15/2023   K 3.5 03/15/2023   CL 105 03/15/2023   CO2 26 03/15/2023   BUN 11 03/15/2023   CREATININE 1.19 03/15/2023   GLUCOSE 97 03/15/2023   GFRNONAA >60 03/15/2023   GFRAA 67 12/29/2019   CALCIUM 9.0 03/15/2023   PROT 7.9 03/11/2023   ALBUMIN 4.2 03/11/2023   BILITOT 1.2 (H) 03/11/2023   ALKPHOS 79 03/11/2023   AST 27 03/11/2023   ALT 21 03/11/2023   ANIONGAP 7 03/15/2023    CBG (last 3)  No results for input(s): "GLUCAP" in the last 72 hours.    Coagulation Profile: No results for input(s): "INR", "PROTIME" in the last 168 hours.   Radiology Studies: I have personally reviewed the imaging studies  No results found.     Thad Ranger M.D. [facility] 03/15/2023, 1:58 PM  Available via Epic secure chat 7am-7pm After 7 pm, please refer to night coverage provider listed on amion.

## 2023-03-15 NOTE — TOC Initial Note (Signed)
Transition of Care [facility]) - Initial/Assessment Note    Patient Details  Name: Benjamin Valdez MRN: [id number] Date of Birth: 26-Oct-1960  [facility]) CM/SW Contact:    Harriet Masson, RN Phone Number: 03/15/2023, 2:46 PM  Clinical Narrative:                 Spoke to patient regarding transition needs.  Patient uses crutches until he is able to get new prothesis. [facility] [address].  PCP confirmed and follows up every .  Patient drives himself to apts. Patient's girlfriend will transport home.  No other TOC needs at time.  Expected Discharge Plan: Home/Self Care Barriers to Discharge: Continued Medical Work up   Patient Goals and CMS Choice Patient states their goals for this hospitalization and ongoing recovery are:: return home          Expected Discharge Plan and Services       Living arrangements for the past 2 months: Single Family Home                                      Prior Living Arrangements/Services Living arrangements for the past 2 months: Single Family Home   Patient language and need for interpreter reviewed:: Yes Do you feel safe going back to the place where you live?: Yes      Need for Family Participation in Patient Care: Yes (Comment) Care giver support system in place?: Yes (comment)   Criminal Activity/Legal Involvement Pertinent to Current Situation/Hospitalization: No - Comment as needed  Activities of Daily Living   ADL Screening (condition at time of admission) Independently performs ADLs?: Yes (appropriate for developmental age) Is the patient deaf or have difficulty hearing?: No Does the patient have difficulty seeing, even when wearing glasses/contacts?: No Does the patient have difficulty concentrating, remembering, or making decisions?: No  Permission Sought/Granted                  Emotional Assessment Appearance:: Appears stated age Attitude/Demeanor/Rapport:  Gracious Affect (typically observed): Accepting Orientation: : Oriented to Self, Oriented to Place, Oriented to  Time, Oriented to Situation Alcohol / Substance Use: Not Applicable Psych Involvement: No (comment)  Admission diagnosis:  Elevated troponin [R79.89] Intractable nausea and vomiting [R11.2] Nausea and vomiting, unspecified vomiting type [R11.2] Patient Active Problem List   Diagnosis Date Noted   Elevated troponin 03/12/2023   Frequent PVCs 03/12/2023   Hypokalemia 03/12/2023   Prolonged QT interval 03/12/2023   Controlled type 2 diabetes mellitus without complication, without long-term current use of insulin (HCC) 03/12/2023   Anxiety 03/12/2023   History of sarcoma 03/12/2023   Marijuana use 03/12/2023   Impaired glucose tolerance 12/02/2018   Marijuana abuse 03/27/2017   GERD (gastroesophageal reflux disease) 03/19/2017   Depression with anxiety 03/19/2017   Intractable nausea and vomiting 03/19/2017   Abdominal pain 03/19/2017   GAD (generalized anxiety disorder) 02/13/2017   Hx of BKA, left (HCC) 11/16/2016   Hypercholesterolemia 01/28/2011   Bone sarcoma (HCC) 01/28/2011   PCP:  Margaree Mackintosh, MD Pharmacy:   [id number] [place], [place] - [address] [place] [address] Phone: [phone] Fax: [phone]  [id number] [place], [place] - [address] [place] [address] Phone: [phone] Fax: [phone]  [id number] [place], [place] - [address]  AT [facility] [address] [place] [address] Phone: [phone] Fax: [phone]     Social Determinants of Health (SDOH) Social History: SDOH Screenings   Food Insecurity: No Food Insecurity (03/12/2023)  Housing: Patient Declined (03/12/2023)  Transportation Needs: No Transportation Needs (03/12/2023)  Utilities: Not At Risk (03/12/2023)  Alcohol Screen: Low Risk   (01/01/2020)  Depression (PHQ2-9): Low Risk  (06/29/2022)  Tobacco Use: Low Risk  (03/11/2023)  Health Literacy: Low Risk  (12/07/2022)   Received from [facility]   SDOH Interventions:     Readmission Risk Interventions     No data to display

## 2023-03-15 NOTE — Plan of Care (Signed)
  Problem: Education: Goal: Knowledge of General Education information will improve Description: Including pain rating scale, medication(s)/side effects and non-pharmacologic comfort measures Outcome: Progressing   Problem: Health Behavior/Discharge Planning: Goal: Ability to manage health-related needs will improve Outcome: Progressing   Problem: Clinical Measurements: Goal: Ability to maintain clinical measurements within normal limits will improve Outcome: Progressing Goal: Will remain free from infection Outcome: Progressing Goal: Diagnostic test results will improve Outcome: Progressing Goal: Respiratory complications will improve Outcome: Progressing Goal: Cardiovascular complication will be avoided Outcome: Progressing   Problem: Activity: Goal: Risk for activity intolerance will decrease Outcome: Progressing   Problem: Nutrition: Goal: Adequate nutrition will be maintained Outcome: Progressing   Problem: Coping: Goal: Level of anxiety will decrease Outcome: Progressing   Problem: Pain Management: Goal: General experience of comfort will improve Outcome: Progressing   Problem: Safety: Goal: Ability to remain free from injury will improve Outcome: Progressing   Problem: Skin Integrity: Goal: Risk for impaired skin integrity will decrease Outcome: Progressing

## 2023-03-16 DIAGNOSIS — R7989 Other specified abnormal findings of blood chemistry: Secondary | ICD-10-CM | POA: Diagnosis not present

## 2023-03-16 DIAGNOSIS — Z85831 Personal history of malignant neoplasm of soft tissue: Secondary | ICD-10-CM | POA: Diagnosis not present

## 2023-03-16 DIAGNOSIS — I493 Ventricular premature depolarization: Secondary | ICD-10-CM | POA: Diagnosis not present

## 2023-03-16 DIAGNOSIS — R112 Nausea with vomiting, unspecified: Secondary | ICD-10-CM | POA: Diagnosis not present

## 2023-03-16 MED ORDER — ONDANSETRON HCL 4 MG PO TABS: 4.0000 mg | ORAL_TABLET | ORAL | 0 refills | Status: AC | PRN

## 2023-03-16 MED ORDER — CAPSAICIN 0.025 % EX CREA: TOPICAL_CREAM | Freq: Two times a day (BID) | CUTANEOUS | 0 refills | Status: AC

## 2023-03-16 NOTE — Plan of Care (Signed)
  Problem: Education: Goal: Knowledge of General Education information will improve Description: Including pain rating scale, medication(s)/side effects and non-pharmacologic comfort measures 03/16/2023 0812 by Marlou Sa, RN Outcome: Progressing 03/15/2023 1934 by Marlou Sa, RN Outcome: Progressing   Problem: Health Behavior/Discharge Planning: Goal: Ability to manage health-related needs will improve 03/16/2023 0812 by Marlou Sa, RN Outcome: Progressing 03/15/2023 1934 by Marlou Sa, RN Outcome: Progressing   Problem: Clinical Measurements: Goal: Ability to maintain clinical measurements within normal limits will improve 03/16/2023 0812 by Marlou Sa, RN Outcome: Progressing 03/15/2023 1934 by Marlou Sa, RN Outcome: Progressing Goal: Will remain free from infection 03/16/2023 0812 by Marlou Sa, RN Outcome: Progressing 03/15/2023 1934 by Hendricks Milo D, RN Outcome: Progressing Goal: Diagnostic test results will improve 03/16/2023 0812 by Marlou Sa, RN Outcome: Progressing 03/15/2023 1934 by Hendricks Milo D, RN Outcome: Progressing Goal: Respiratory complications will improve 03/16/2023 0812 by Marlou Sa, RN Outcome: Progressing 03/15/2023 1934 by Hendricks Milo D, RN Outcome: Progressing Goal: Cardiovascular complication will be avoided 03/16/2023 1191 by Marlou Sa, RN Outcome: Progressing 03/15/2023 1934 by Marlou Sa, RN Outcome: Progressing   Problem: Activity: Goal: Risk for activity intolerance will decrease 03/16/2023 0812 by Marlou Sa, RN Outcome: Progressing 03/15/2023 1934 by Marlou Sa, RN Outcome: Progressing   Problem: Nutrition: Goal: Adequate nutrition will be maintained 03/16/2023 0812 by Marlou Sa, RN Outcome: Progressing 03/15/2023 1934 by Hendricks Milo D,  RN Outcome: Progressing   Problem: Coping: Goal: Level of anxiety will decrease 03/16/2023 0812 by Marlou Sa, RN Outcome: Progressing 03/15/2023 1934 by Marlou Sa, RN Outcome: Progressing   Problem: Elimination: Goal: Will not experience complications related to bowel motility 03/16/2023 0812 by Marlou Sa, RN Outcome: Progressing 03/15/2023 1934 by Marlou Sa, RN Outcome: Progressing Goal: Will not experience complications related to urinary retention 03/16/2023 0812 by Marlou Sa, RN Outcome: Progressing 03/15/2023 1934 by Marlou Sa, RN Outcome: Progressing   Problem: Pain Management: Goal: General experience of comfort will improve 03/16/2023 0812 by Marlou Sa, RN Outcome: Progressing 03/15/2023 1934 by Marlou Sa, RN Outcome: Progressing   Problem: Safety: Goal: Ability to remain free from injury will improve 03/16/2023 0812 by Marlou Sa, RN Outcome: Progressing 03/15/2023 1934 by Hendricks Milo D, RN Outcome: Progressing   Problem: Skin Integrity: Goal: Risk for impaired skin integrity will decrease 03/16/2023 0812 by Marlou Sa, RN Outcome: Progressing 03/15/2023 1934 by Marlou Sa, RN Outcome: Progressing

## 2023-03-16 NOTE — Discharge Summary (Signed)
Physician Discharge Summary   Patient: Benjamin Valdez MRN: [id number] DOB: September 21, 1960  Admit date:     03/11/2023  Discharge date: 03/16/23  Discharge Physician: Thad Ranger, MD    PCP: Margaree Mackintosh, MD   Recommendations at discharge:   Continue Zofran 4 mg every 4 hours as needed for nausea or vomiting Counseled strongly on [facility] cessation  Discharge Diagnoses:    Intractable nausea and vomiting Cannabinoid hyperemesis syndrome   Elevated troponin likely due to demand ischemia   Frequent PVCs   Hypokalemia   History of sarcoma   Prolonged QT interval   Controlled type 2 diabetes mellitus without complication, without long-term current use of insulin (HCC)   Anxiety   Hypercholesterolemia   GERD (gastroesophageal reflux disease)   Marijuana use   Hospital Course:  Patient is a 62 year old male with angiosarcoma left leg, status post BKA, hiatal hernia, gastritis, marijuana use presented with nausea and vomiting over the last 4 days prior to admission.  Patient reported pain in the middle of his abdomen, no hematemesis. Seen at [facility].  CT on 11/18 showed no acute intra-abdominal pathology.  UDS positive for marijuana benzodiazepines and opiates   Patient presented third time to ED with fever of 101.1 F, nausea vomiting, some chest discomfort due to vomiting.  Patient had revision of his BKA approximately 2 weeks ago.  He is followed by [facility] orthopedics and was recently prescribed a course of doxycycline, however could not tolerated due to nausea and vomiting.  He does admits to using marijuana on daily basis.   In ED, afebrile, BP elevated 177/106, mild tachypnea  CBC within normal limits, potassium 3.3, BUN 28, creatinine 1.05, lactic acid 2-> 1.2,  troponin 71->79.   CT scan of the head did not note any acute abnormality.  CT scan of the chest abdomen pelvis noted no acute abnormality with mild fatty infiltration of the liver and aortic atherosclerosis.    Patient had been given 1 L lactated Ringer's, Zofran 4 mg IV, and morphine 4 mg IV.  Admitted for further workup.     Assessment and Plan:    Intractable nausea and vomiting, acute -Likely cannabinoid hyperemesis syndrome although gastroenteritis could be possible -UDS 11/23 benzo diazepam's, opiates and THC -CT abdomen did not show acute intraabdominal or pelvic pathology, mild fatty infiltration of liver -Patient is now tolerating solid diet since yesterday, feels he is ready to go home. -Counseled strongly on THC cessation -Continue Zofran as needed for nausea, vomiting.     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  Wound appears to be healing and patient has been on doxycycline for treatment.  Records note he was last seen by Dr. Lajoyce Corners in the office on 11/15 with plan for reevaluating to remove sutures in 2 weeks. -Continue doxycycline, Outpatient follow-up with Dr. Lajoyce Corners     Prolonged QT interval -QTc on admission 536  -QTc improved to 454 on 11/20 3 repeat EKG       Controlled type 2 diabetes mellitus without complication, without long-term current use of insulin (HCC) -Patient had been on metformin,  appears to have been discontinued -Hemoglobin A1c 6.0     Anxiety -Continue Prozac and Xanax as needed   Hyperlipidemia -Continue Crestor   GERD -Continue pharmacy substitution of Protonix for Pepcid   Marijuana use Patient reports daily use of marijuana.  Suspect patient routine use of marijuana could be a contributing factor for his symptoms of nausea and vomiting. -UDS positive for THC, likely causing hyperemesis  syndrome  -counseled patient strongly on THC cessation     Estimated body mass index is 22.1 kg/m as calculated from the following:   Height as of this encounter: 6' (1.829 m).   Weight as of this encounter: 73.9 kg.       Pain control - [employer] Controlled Substance Reporting System database was reviewed. and patient was instructed, not to drive, operate heavy machinery, perform activities at heights, swimming or participation in water activities or provide baby-sitting services while on Pain, Sleep and Anxiety Medications; until their outpatient Physician has advised to do so again. Also recommended to not to take more than prescribed Pain, Sleep and Anxiety Medications.  Consultants: None Procedures performed: None Disposition: Home Diet recommendation: Soft diet  DISCHARGE MEDICATION: Allergies as of 03/16/2023   No Known Allergies      Medication List     TAKE these medications    ALPRAZolam 1 MG tablet Commonly known as: XANAX Take 1 mg by mouth daily as needed for anxiety.   aspirin EC 81 MG tablet Take 81 mg by mouth daily. Swallow whole.   capsaicin 0.025 % cream Commonly known as: ZOSTRIX Apply topically 2 (two) times daily for 7 days. Apply to abdomen. AVOID contact with eyes and broken or irritated skin.   docusate sodium 100 MG capsule Commonly known as: COLACE Take 100 mg by mouth 2 (two) times daily as needed for mild constipation.   doxycycline 100 MG tablet Commonly known as: ADOXA Take 100 mg by mouth 2 (two) times daily. X14 days   FLUoxetine 40 MG capsule Commonly known as: PROZAC Take 40 mg by mouth daily. TDD 60mg    FLUoxetine 20 MG capsule Commonly known as: PROZAC Take 20 mg by mouth every morning. TDD 60mg    omeprazole 40 MG capsule Commonly known as: PRILOSEC TAKE 1 CAPSULE BY MOUTH EVERY DAY   ondansetron 4 MG tablet Commonly known as: ZOFRAN Take 1 tablet (4 mg total) by mouth every 4 (four) hours as needed for nausea or  vomiting.   promethazine 25 MG tablet Commonly known as: PHENERGAN Take 1 tablet (25 mg total) by mouth every 6 (six) hours as needed for nausea or vomiting.   promethazine 25 MG suppository Commonly known as: PHENERGAN Place 1 suppository (25 mg total) rectally every 6 (six) hours as needed for nausea or vomiting.   QUEtiapine 50 MG tablet Commonly known as: SEROQUEL Take 150 mg by mouth at bedtime.   Rexulti 1 MG Tabs tablet Generic drug: brexpiprazole Take 1 mg by mouth every morning.   rosuvastatin 20 MG tablet Commonly known as: CRESTOR TAKE 1 TABLET BY MOUTH EVERY DAY        Follow-up Information     Baxley, [name], MD. Schedule an appointment as soon as possible for a visit in 2 week(s).   Specialty: Internal Medicine Why: for hospital follow-up Dec. 23 ,2024 @ 3PM Contact information: [address] [place] [address] [phone]                Discharge Exam: Ceasar Mons Weights  03/11/23 1942 03/12/23 0220  Weight: 76 kg 73.9 kg   S: Feeling a lot better, tolerating soft diet, no further nausea or vomiting.  No fevers or chills.  Feels ready to go home  BP 113/80 (BP Location: Left Arm)   Pulse 75   Temp 97.8 F (36.6 C) (Oral)   Resp (!) 22   Ht 6' (1.829 m)   Wt 73.9 kg   SpO2 99%   BMI 22.10 kg/m   Physical Exam General: Alert and oriented x 3, NAD Cardiovascular: S1 S2 clear, RRR.  Respiratory: CTAB, no wheezing, rales or rhonchi Gastrointestinal: Soft, nontender, nondistended, NBS Ext: no pedal edema bilaterally Neuro: no new deficits Psych: Normal affect   Condition at discharge: fair  The results of significant diagnostics from this hospitalization (including imaging, microbiology, ancillary and laboratory) are listed below for reference.   Imaging Studies: ECHOCARDIOGRAM COMPLETE  Result Date: 03/13/2023    ECHOCARDIOGRAM REPORT   Patient Name:   SEIJI LETBETTER Date of Exam: 03/13/2023 Medical Rec #:   [id number]          Height:       72.0 in Accession #:    [id number]         Weight:       162.9 lb Date of Birth:  05/30/1960          BSA:          1.953 m Patient Age:    62 years           BP:           130/93 mmHg Patient Gender: M                  HR:           64 bpm. Exam Location:  Inpatient Procedure: 2D Echo, Color Doppler and Cardiac Doppler Indications:    Elevated troponin  History:        Patient has no prior history of Echocardiogram examinations.                 Elevated Troponin; Risk Factors:Marijuana Use, Diabetes and                 Dyslipidemia.  Sonographer:    Milbert Coulter Referring Phys: [id number] RONDELL A SMITH  Sonographer Comments: No subcostal window. Exam had to be stopped multiple times due to patient's significant nausea. Patient could not tolerate any subcostal imaging. IMPRESSIONS  1. Left ventricular ejection fraction, by estimation, is 60 to 65%. The left ventricle has normal function. Left ventricular endocardial border not optimally defined to evaluate regional wall motion. Left ventricular diastolic parameters were normal.  2. Right ventricular systolic function is hyperdynamic. The right ventricular size is normal. Tricuspid regurgitation signal is inadequate for assessing PA pressure.  3. Left atrial size was mildly dilated.  4. The mitral valve is normal in structure. Trivial mitral valve regurgitation. No evidence of mitral stenosis.  5. The aortic valve was not well visualized. Aortic valve regurgitation is not visualized. Aortic valve sclerosis/calcification is present, without any evidence of aortic stenosis. Comparison(s): No prior Echocardiogram. FINDINGS  Left Ventricle: Left ventricular ejection fraction, by estimation, is 60 to 65%. The left ventricle has normal function. Left ventricular endocardial border not optimally defined to evaluate regional wall motion. The left ventricular internal cavity size was normal in size. There is no left ventricular hypertrophy.  Left ventricular diastolic parameters were normal. Right Ventricle: The right ventricular size  is normal. No increase in right ventricular wall thickness. Right ventricular systolic function is hyperdynamic. Tricuspid regurgitation signal is inadequate for assessing PA pressure. Left Atrium: Left atrial size was mildly dilated. Right Atrium: Right atrial size was normal in size. Pericardium: There is no evidence of pericardial effusion. Mitral Valve: The mitral valve is normal in structure. Trivial mitral valve regurgitation. No evidence of mitral valve stenosis. Tricuspid Valve: The tricuspid valve is normal in structure. Tricuspid valve regurgitation is not demonstrated. No evidence of tricuspid stenosis. Aortic Valve: The aortic valve was not well visualized. Aortic valve regurgitation is not visualized. Aortic valve sclerosis/calcification is present, without any evidence of aortic stenosis. Aortic valve mean gradient measures 4.0 mmHg. Aortic valve peak gradient measures 7.4 mmHg. Aortic valve area, by VTI measures 2.70 cm. Pulmonic Valve: The pulmonic valve was not well visualized. Pulmonic valve regurgitation is not visualized. No evidence of pulmonic stenosis. Aorta: The aortic root is normal in size and structure. Venous: The inferior vena cava was not well visualized. IAS/Shunts: The interatrial septum was not well visualized.  LEFT VENTRICLE PLAX 2D LVIDd:         5.00 cm   Diastology LVIDs:         2.50 cm   LV e' medial:    10.60 cm/s LV PW:         1.10 cm   LV E/e' medial:  7.8 LV IVS:        0.90 cm   LV e' lateral:   21.80 cm/s LVOT diam:     2.20 cm   LV E/e' lateral: 3.8 LV SV:         70 LV SV Index:   36 LVOT Area:     3.80 cm  RIGHT VENTRICLE RV Basal diam:  3.00 cm RV Mid diam:    2.70 cm RV S prime:     23.90 cm/s TAPSE (M-mode): 2.7 cm LEFT ATRIUM           Index        RIGHT ATRIUM           Index LA diam:      3.40 cm 1.74 cm/m   RA Area:     15.70 cm LA Vol (A2C): 75.2 ml 38.51 ml/m   RA Volume:   36.80 ml  18.85 ml/m LA Vol (A4C): 22.3 ml 11.42 ml/m  AORTIC VALVE AV Area (Vmax):    2.66 cm AV Area (Vmean):   2.66 cm AV Area (VTI):     2.70 cm AV Vmax:           136.00 cm/s AV Vmean:          87.100 cm/s AV VTI:            0.260 m AV Peak Grad:      7.4 mmHg AV Mean Grad:      4.0 mmHg LVOT Vmax:         95.10 cm/s LVOT Vmean:        60.900 cm/s LVOT VTI:          0.185 m LVOT/AV VTI ratio: 0.71  AORTA Ao Root diam: 3.70 cm MITRAL VALVE MV Area (PHT): 4.49 cm    SHUNTS MV Decel Time: 169 msec    Systemic VTI:  0.18 m MV E velocity: 82.30 cm/s  Systemic Diam: 2.20 cm MV A velocity: 63.50 cm/s MV E/A ratio:  1.30 Vishnu Priya Mallipeddi Electronically signed by Winfield Rast Mallipeddi Signature  Date/Time: 03/13/2023/2:42:03 PM    Final    CT ABDOMEN PELVIS W CONTRAST  Result Date: 03/11/2023 CLINICAL DATA:  Abdominal pain. EXAM: CT ABDOMEN AND PELVIS WITH CONTRAST TECHNIQUE: Multidetector CT imaging of the abdomen and pelvis was performed using the standard protocol following bolus administration of intravenous contrast. RADIATION DOSE REDUCTION: This exam was performed according to the departmental dose-optimization program which includes automated exposure control, adjustment of the mA and/or kV according to patient size and/or use of iterative reconstruction technique. CONTRAST:  OMNIPAQUE IOHEXOL 300 MG/ML  SOLN COMPARISON:  CT abdomen pelvis dated 03/08/2023. FINDINGS: Lower chest: Right lung base atelectasis/scarring. The visualized lung bases are otherwise clear. No intra-abdominal free air or free fluid. Hepatobiliary: Small cyst in the left lobe of the liver. There is apparent mild fatty infiltration of the liver. No biliary dilatation. The gallbladder is unremarkable Pancreas: Unremarkable. No pancreatic ductal dilatation or surrounding inflammatory changes. Spleen: Normal in size without focal abnormality. Adrenals/Urinary Tract: The adrenal glands unremarkable. There is  no hydronephrosis on either side. Small left parapelvic cysts. There is symmetric enhancement and excretion of contrast by both kidneys. The visualized ureters and urinary bladder appear unremarkable. Stomach/Bowel: There is no bowel obstruction or active inflammation. Appendectomy. Vascular/Lymphatic: Moderate aortoiliac atherosclerotic disease. The IVC is unremarkable. No portal venous gas. There is no adenopathy. Reproductive: The prostate and seminal vesicles are grossly unremarkable. No pelvic mass. Partial visualized bilateral hydroceles. Other: None Musculoskeletal: Degenerative changes of the spine. No acute osseous pathology. IMPRESSION: 1. No acute intra-abdominal or pelvic pathology. 2. Mild fatty infiltration of the liver. 3.  Aortic Atherosclerosis (ICD10-I70.0). Electronically Signed   By: Elgie Collard M.D.   On: 03/11/2023 21:08   CT Head Wo Contrast  Result Date: 03/11/2023 CLINICAL DATA:  Headache, increasing frequency or severity EXAM: CT HEAD WITHOUT CONTRAST TECHNIQUE: Contiguous axial images were obtained from the base of the skull through the vertex without intravenous contrast. RADIATION DOSE REDUCTION: This exam was performed according to the departmental dose-optimization program which includes automated exposure control, adjustment of the mA and/or kV according to patient size and/or use of iterative reconstruction technique. COMPARISON:  Head CT 02/12/2012 FINDINGS: Brain: No intracranial hemorrhage, mass effect, or midline shift. No hydrocephalus. The basilar cisterns are patent. No evidence of territorial infarct or acute ischemia. No extra-axial or intracranial fluid collection. Vascular: No hyperdense vessel or unexpected calcification. Skull: No fracture or focal lesion. Sinuses/Orbits: Mild mucosal thickening of the maxillary sinuses. No sinus fluid levels. The mastoid air cells are clear. Other: None available. IMPRESSION: 1. No acute intracranial abnormality. 2. Mild  mucosal thickening of the maxillary sinuses. Electronically Signed   By: Narda Rutherford M.D.   On: 03/11/2023 21:00   CT Angio Chest Aorta W and/or Wo Contrast  Result Date: 03/09/2023 CLINICAL DATA:  Insert x-ray.  Nausea, vomiting, abdominal pain EXAM: CT ANGIOGRAPHY CHEST WITH CONTRAST TECHNIQUE: Multidetector CT imaging of the chest was performed using the standard protocol during bolus administration of intravenous contrast. Multiplanar CT image reconstructions and MIPs were obtained to evaluate the vascular anatomy. RADIATION DOSE REDUCTION: This exam was performed according to the departmental dose-optimization program which includes automated exposure control, adjustment of the mA and/or kV according to patient size and/or use of iterative reconstruction technique. CONTRAST:  75mL OMNIPAQUE IOHEXOL 350 MG/ML SOLN COMPARISON:  None Available. FINDINGS: Cardiovascular: No filling defects in the pulmonary arteries to suggest pulmonary emboli. Heart is normal size. Aorta is normal caliber. Mediastinum/Nodes: No mediastinal, hilar, or  axillary adenopathy. Trachea and esophagus are unremarkable. Thyroid unremarkable. Lungs/Pleura: Linear areas of atelectasis in the lower lobes bilaterally. No effusions. Upper Abdomen: No acute findings Musculoskeletal: Chest wall soft tissues are unremarkable. No acute bony abnormality. Review of the MIP images confirms the above findings. IMPRESSION: No evidence of pulmonary embolus. Bibasilar atelectasis. No acute cardiopulmonary disease. Electronically Signed   By: Charlett Nose M.D.   On: 03/09/2023 03:11   CT ABDOMEN PELVIS W CONTRAST  Result Date: 03/08/2023 CLINICAL DATA:  Epigastric pain. EXAM: CT ABDOMEN AND PELVIS WITH CONTRAST TECHNIQUE: Multidetector CT imaging of the abdomen and pelvis was performed using the standard protocol following bolus administration of intravenous contrast. RADIATION DOSE REDUCTION: This exam was performed according to the  departmental dose-optimization program which includes automated exposure control, adjustment of the mA and/or kV according to patient size and/or use of iterative reconstruction technique. CONTRAST:  OMNIPAQUE IOHEXOL 300 MG/ML  SOLN COMPARISON:  CT abdomen pelvis dated 07/12/2022. FINDINGS: Lower chest: Right lung base linear atelectasis/scarring. The visualized lung bases are otherwise clear. No intra-abdominal free air or free fluid. Hepatobiliary: Several small liver cysts as seen previously. Additional subcentimeter hypodense lesions are too small to characterize. No biliary dilatation. The gallbladder is unremarkable. Pancreas: Unremarkable. No pancreatic ductal dilatation or surrounding inflammatory changes. Spleen: Normal in size without focal abnormality. Adrenals/Urinary Tract: The adrenal glands unremarkable. There is no hydronephrosis on either side. There is symmetric enhancement and excretion of contrast by both kidneys. The visualized ureters and urinary bladder appear unremarkable. Stomach/Bowel: There is no bowel obstruction or active inflammation. The appendix is not visualized with certainty. No inflammatory changes identified in the right lower quadrant. Vascular/Lymphatic: Mild aortoiliac atherosclerotic disease. The IVC is unremarkable. No portal venous gas. There is no adenopathy. Reproductive: The prostate and seminal vesicles are grossly remarkable. Partially visualized bilateral hydroceles. Other: None Musculoskeletal: Degenerative changes of the spine. No acute osseous pathology. IMPRESSION: 1. No acute intra-abdominal or pelvic pathology. 2. Partially visualized bilateral hydroceles. 3.  Aortic Atherosclerosis (ICD10-I70.0). Electronically Signed   By: Elgie Collard M.D.   On: 03/08/2023 22:34    Microbiology: Results for orders placed or performed during the hospital encounter of 03/11/23  MRSA Next Gen by PCR, Nasal     Status: None   Collection Time: 03/12/23  3:09 AM    Specimen: Nasal Mucosa; Nasal Swab  Result Value Ref Range Status   MRSA by PCR Next Gen NOT DETECTED NOT DETECTED Final    Comment: (NOTE) The GeneXpert MRSA Assay (FDA approved for NASAL specimens only), is one component of a comprehensive MRSA colonization surveillance program. It is not intended to diagnose MRSA infection nor to guide or monitor treatment for MRSA infections. Test performance is not FDA approved in patients less than 99 years old. Performed at [facility], [address], [place], [place] [address]     Labs: CBC: Recent Labs  Lab 03/11/23 1955 03/13/23 0204 03/14/23 0224 03/15/23 0211  WBC 6.2 4.9 6.0 6.9  NEUTROABS 5.4  --   --   --   HGB 13.6 11.4* 11.5* 12.2*  HCT 39.5 35.1* 33.2* 37.6*  MCV 85.7 89.1 86.7 90.0  PLT 362 229 257 249   Basic Metabolic Panel: Recent Labs  Lab 03/11/23 1955 03/11/23 2126 03/13/23 0204 03/14/23 0224 03/15/23 0211  NA 138  --  141 138 138  K 3.3*  --  3.3* 3.2* 3.5  CL 102  --  108 104 105  CO2  19*  --  25 24 26   GLUCOSE 119*  --  94 111* 97  BUN 28*  --  19 13 11   CREATININE 1.05  --  1.09 1.13 1.19  CALCIUM 9.4  --  8.6* 8.7* 9.0  MG  --  1.7 1.9  --   --    Liver Function Tests: Recent Labs  Lab 03/11/23 1955  AST 27  ALT 21  ALKPHOS 79  BILITOT 1.2*  PROT 7.9  ALBUMIN 4.2   CBG: No results for input(s): "GLUCAP" in the last 168 hours.  Discharge time spent: greater than 30 minutes.  Signed: Thad Ranger, MD [facility] 03/16/2023

## 2023-03-17 ENCOUNTER — Telehealth: Payer: Self-pay

## 2023-03-17 NOTE — Telephone Encounter (Signed)
Transition Care Management Follow-up Telephone Call Date of discharge and from where: 03/16/23 [facility]  How have you been since you were released from the hospital? Doing better, is home and resting.  Any questions or concerns? No    Items Reviewed: Did the pt receive and understand the discharge instructions provided?Medications obtained and verified? Yes  Other?  Any new allergies since your discharge? No  Dietary orders reviewed? No  Do you have support at home? Yes, girlfriend.   Home Care and Equipment/Supplies: Were home health services ordered? No  If so, what is the name of the agency?  n/a Has the agency set up a time to come to the patient's home? n/a Were any new equipment or medical supplies ordered? n/a What is the name of the medical supply agency? n/a   Were you able to get the supplies/equipment? n/a Do you have any questions related to the use of the equipment or supplies? n/a    Functional Questionnaire: (I = Independent and D = Dependent) ADLs: i   Bathing/Dressing- i   Meal Prep- i   Eating-  i Maintaining continence- i    Transferring/Ambulation- i   Managing Meds- i   Follow up appointments reviewed:   PCP Hospital f/u appt confirmed? Yes 03/22/23, at 12:00pm [facility] f/u appt confirmed? Yes, 03/22/23 at 8:45am orthopedist.  Are transportation arrangements needed? No  If their condition worsens, is the pt aware to call PCP or go to the Emergency Dept.? Yes  Was the patient provided with contact information for the PCP's office or ED? Yes  Was to pt encouraged to call back with questions or concerns? Yes

## 2023-03-22 ENCOUNTER — Ambulatory Visit (INDEPENDENT_AMBULATORY_CARE_PROVIDER_SITE_OTHER): Payer: Medicare HMO | Admitting: Orthopedic Surgery

## 2023-03-22 ENCOUNTER — Encounter: Payer: Self-pay | Admitting: Orthopedic Surgery

## 2023-03-22 ENCOUNTER — Ambulatory Visit (INDEPENDENT_AMBULATORY_CARE_PROVIDER_SITE_OTHER): Payer: Medicare HMO | Admitting: Internal Medicine

## 2023-03-22 ENCOUNTER — Telehealth: Payer: Self-pay | Admitting: Internal Medicine

## 2023-03-22 ENCOUNTER — Encounter: Payer: Self-pay | Admitting: Internal Medicine

## 2023-03-22 VITALS — BP 100/80 | HR 84 | Ht 72.0 in | Wt 147.0 lb

## 2023-03-22 DIAGNOSIS — R1115 Cyclical vomiting syndrome unrelated to migraine: Secondary | ICD-10-CM | POA: Diagnosis not present

## 2023-03-22 DIAGNOSIS — S81802A Unspecified open wound, left lower leg, initial encounter: Secondary | ICD-10-CM

## 2023-03-22 DIAGNOSIS — Z89512 Acquired absence of left leg below knee: Secondary | ICD-10-CM

## 2023-03-22 DIAGNOSIS — E876 Hypokalemia: Secondary | ICD-10-CM

## 2023-03-22 DIAGNOSIS — F32A Depression, unspecified: Secondary | ICD-10-CM | POA: Diagnosis not present

## 2023-03-22 DIAGNOSIS — T8781 Dehiscence of amputation stump: Secondary | ICD-10-CM

## 2023-03-22 DIAGNOSIS — Z09 Encounter for follow-up examination after completed treatment for conditions other than malignant neoplasm: Secondary | ICD-10-CM | POA: Diagnosis not present

## 2023-03-22 DIAGNOSIS — F1290 Cannabis use, unspecified, uncomplicated: Secondary | ICD-10-CM

## 2023-03-22 DIAGNOSIS — F419 Anxiety disorder, unspecified: Secondary | ICD-10-CM | POA: Diagnosis not present

## 2023-03-22 DIAGNOSIS — Z23 Encounter for immunization: Secondary | ICD-10-CM | POA: Diagnosis not present

## 2023-03-22 NOTE — Telephone Encounter (Signed)
Copied from CRM [id number]. Topic: Clinical - Prescription Issue >> Mar 22, 2023 12:41 PM Sasha H wrote: Reason for CRM: Pts pharmacy sates they did not receive enough information to fill the medication, promethazine (PHENERGAN) 25 MG suppository. Please reach out to pharmacy and patient.

## 2023-03-22 NOTE — Patient Instructions (Signed)
Visit today was hospitalization follow-up for intractable cyclical vomiting which has improved.  VMAT ordered today to follow-up on renal functions and electrolytes.  Patient is status post surgery for abscess left BKA stump November 4 at [facility].  Wound seems to be healing well.  He is being followed by a local orthopedist as well.  Anxiety and depression is stable.  He is seen by counselor on a regular basis.  Cannabis use may have triggered cyclical vomiting.  Advised patient to cut down on cannabis use.  He will return as needed but has health maintenance exam later this month.

## 2023-03-22 NOTE — Telephone Encounter (Signed)
Dr Lenord Fellers received this CRM, we had no idea what was going on, after looking it up, this medication was ordered by the hospitalitis, when patient left the hospital on 03/13/2023, so we had to call the pharmacy to find out what they needed which was that the medicine was not covered by patients insurance then call the patient to see if he wanted to pay out of pocket then call pharmacy to let them know to go ahead and fill medication.

## 2023-03-22 NOTE — Progress Notes (Addendum)
Patient Care Team: Margaree Mackintosh, MD as PCP - General (Internal Medicine) Janalyn Harder, MD (Inactive) as Consulting Physician (Dermatology)  Visit Date: 03/22/23  Subjective:    Patient ID: Benjamin Valdez , Male   DOB: 1960-11-29, 62 y.o.    MRN: [id number]   62 y.o. Male presents today for hospital follow-up. Seen in ED for nausea/vomiting on 03/08/23 and again on 03/09/23. Admitted to [facility] from 03/11/23 - 03/16/23 for intractable nausea and vomiting. He was smoking a significant amount of cannabis and believes that this contributed to cyclical vomiting.He has hx of cyclical vomiting and has had ED visits from time to time to receive anti-emetic meds and IVFs. He attributes this increased  cannabis smoking to an increase in sedentary lifestyle due to recent surgical treatment for nonhealing BKA wound. On 03/11/23, CT abdomen showed no acute intraabdominal or pelvic pathology, mild fatty infiltration liver. UDS on 03/13/23 showed benzodiazepines, opiates, THC. Potassium was as low as 3.2 on 03/14/23 and improved to 3.5 on 03/15/23. Magnesium low-normal at 1.7 on 03/11/23 and improved to 1.9 on 03/13/23. He has Zofran, promethazine as needed.  Hx of anxiety and depression longstanding. Sees Hal Neer.  Status post revision transtibial amputation in [place] on 02/22/23. Completed course of doxycycline and was given oxycodone for pain.  Past Medical History:  Diagnosis Date   Angiosarcoma (HCC)    Of the left leg   Atypical mole 07/14/1996   Upper Mid Back (slight)   Atypical nevus 07/14/1996   Mid/Lower Right Back (slight to moderate)   Atypical nevus 12/28/1996   Right Mid Back (slight to moderate)   Atypical nevus 12/28/1996   Mid Lower Back (slight to moderate) [name]   Atypical nevus 10/26/2000   Right Abdomen (moderate)   Atypical nevus 07/27/2003   Left Lower Back (moderate) (widershave)   Atypical nevus 07/27/2003   Left Lower Back (mid)  (slight) (widershave)   Atypical nevus 07/27/2003   Right Lower Back (inf) (slight) (widershave)   Atypical nevus 07/27/2003   Right Lower Back (sup) (slight) (widershave)   Atypical nevus 07/27/2003   Right Back (slight)   Atypical nevus 07/27/2003   Right Center Back (sup) (slight)   Atypical nevus 07/27/2003   Right Center Back (inf) (mild)   Atypical nevus 07/27/2003   Right Side (mild)   Atypical nevus 07/27/2003   Right Mid Back (slight) (4mm punch widershave)   Atypical nevus 03/10/2005   Left Abdomen (moderate to severe) [name]   Atypical nevus 03/10/2005   Left Upper Back,sup (moderate)   Atypical nevus 03/10/2005   Left Upper Back,medial (moderate) (observe)   Atypical nevus 03/10/2005   Left Upper Back, inf (moderate)   Atypical nevus 04/22/2006   Right Mid Back (marked) (widershave)   Bone cancer (HCC)    has left BK amputation   Chest pain April 2010   Myocardial Infarction ruled out   Dyslipidemia    Essential hypertension    Gastritis April 2010   Acute--Resolved   Hiatal hernia    Marijuana abuse    History of Marijuana use   Melanoma (HCC) 10/02/2019   LEFT LOWER BACK MM INSITU   Nausea alone    Recurrent   Neck problem    Vomiting      Family History  Problem Relation Age of Onset   Diabetes Mother    Bladder Cancer Father    Crohn's disease Brother     Social History   Social History  Narrative   Wears glasses    Right handed    Drinks coffee 3 cups per week   Drinks sodas 3-4 times per week.  Single. Has girlfriend. When healthy he works out at [employer]. Unemployed. Receives disability benefits.   Review of Systems  Constitutional:  Negative for fever and malaise/fatigue.  HENT:  Negative for congestion.   Eyes:  Negative for blurred vision.  Respiratory:  Negative for cough and shortness of breath.   Cardiovascular:  Negative for chest pain, palpitations and leg swelling.  Gastrointestinal:  Negative for vomiting.   Musculoskeletal:  Negative for back pain.  Skin:  Negative for rash.  Neurological:  Negative for loss of consciousness and headaches.        Objective:   Vitals: BP 100/80   Pulse 84   Ht 6' (1.829 m)   Wt 147 lb (66.7 kg)   SpO2 97%   BMI 19.94 kg/m    Physical Exam Vitals and nursing note reviewed.  Constitutional:      General: He is not in acute distress.    Appearance: Normal appearance. He is not ill-appearing.  HENT:     Head: Normocephalic and atraumatic.  Pulmonary:     Effort: Pulmonary effort is normal.  Skin:    General: Skin is warm and dry.     Comments: Surgical wound left lower residual limb healing well.  Neurological:     Mental Status: He is alert and oriented to person, place, and time. Mental status is at baseline.  Psychiatric:        Mood and Affect: Mood normal.        Behavior: Behavior normal.        Thought Content: Thought content normal.        Judgment: Judgment normal.       Results:   Studies obtained and personally reviewed by me:   Labs:       Component Value Date/Time   NA 138 03/15/2023 0211   K 3.5 03/15/2023 0211   CL 105 03/15/2023 0211   CO2 26 03/15/2023 0211   GLUCOSE 97 03/15/2023 0211   BUN 11 03/15/2023 0211   CREATININE 1.19 03/15/2023 0211   CREATININE 1.18 04/07/2022 1543   CALCIUM 9.0 03/15/2023 0211   PROT 7.9 03/11/2023 1955   ALBUMIN 4.2 03/11/2023 1955   AST 27 03/11/2023 1955   ALT 21 03/11/2023 1955   ALKPHOS 79 03/11/2023 1955   BILITOT 1.2 (H) 03/11/2023 1955   GFRNONAA >60 03/15/2023 0211   GFRNONAA 57 (L) 12/29/2019 0935   GFRAA 67 12/29/2019 0935     Lab Results  Component Value Date   WBC 6.9 03/15/2023   HGB 12.2 (L) 03/15/2023   HCT 37.6 (L) 03/15/2023   MCV 90.0 03/15/2023   PLT 249 03/15/2023    Lab Results  Component Value Date   CHOL 176 07/07/2022   HDL 58 07/07/2022   LDLCALC 99 07/07/2022   TRIG 97 07/07/2022   CHOLHDL 3.0 07/07/2022    Lab Results   Component Value Date   HGBA1C 6.0 (H) 03/12/2023     Lab Results  Component Value Date   TSH 2.76 10/13/2022     Lab Results  Component Value Date   PSA 1.31 04/07/2022   PSA 1.35 02/25/2021   PSA 0.9 12/29/2019      Assessment & Plan:   Nausea/vomiting- hx of cyclical vomiting: this has improved. Currently stable and well hydrated. Visit today is  for hospital follow up due to intractable cyclical vomiting  Hypokalemia / hypomagnesemia: ordered BMET today for follow up on electrolytes and kidney function.  S/p surgery for abscess Left BKA on  Nov 4 at [facility]  Vaccine counseling: administered Flu vaccine  today.   Anxiety and depression seen by counselor   Cannabis use- may have triggered vomiting    Return on 04/12/23 for annual health maintenance exam. B-met drawn and results pending today    I,Alexander Ruley,acting as a scribe for Margaree Mackintosh, MD.,have documented all relevant documentation on the behalf of Margaree Mackintosh, MD,as directed by  Margaree Mackintosh, MD while in the presence of Margaree Mackintosh, MD.   I, Margaree Mackintosh, MD, have reviewed all documentation for this visit. The documentation on 03/22/23 for the exam, diagnosis, procedures, and orders are all accurate and complete.

## 2023-03-22 NOTE — Progress Notes (Signed)
Office Visit Note   Patient: Benjamin Valdez           Date of Birth: 11-Mar-1961           MRN: [id number] Visit Date: 03/22/2023              Requested by: Margaree Mackintosh, MD [address] [place],  [place] [address] PCP: Margaree Mackintosh, MD  Chief Complaint  Patient presents with   Left Leg - Follow-up    HX BKA       HPI: Patient is a 62 year old gentleman who is status post revision transtibial amputation in [place].  Patient is currently wearing the Vive shrinker.  Assessment & Plan: Visit Diagnoses:  1. Dehiscence of amputation stump of left lower extremity (HCC)   2. Left below-knee amputee [facility])     Plan: Patient will follow-up in [facility] for the suture removal.  A prescription was provided for [employer] for a new socket There new materials and supplies.  Follow-Up Instructions: No follow-ups on file.   Ortho Exam  Patient is alert, oriented, no adenopathy, well-dressed, normal affect, normal respiratory effort. Examination patient has a small scab remaining.  The incision is well-healed the sutures are in place.  There is no cellulitis no drainage the leg is well consolidated.  Patient is an existing left transtibial  amputee.  Patient's current comorbidities are not expected to impact the ability to function with the prescribed prosthesis. Patient verbally communicates a strong desire to use a prosthesis. Patient currently requires mobility aids to ambulate without a prosthesis.  Expects not to use mobility aids with a new prosthesis.  Patient is a K3 level ambulator that spends a lot of time walking around on uneven terrain over obstacles, up and down stairs, and ambulates with a variable cadence.     Imaging: No results found. No images are attached to the encounter.  Labs: Lab Results  Component Value Date   HGBA1C 6.0 (H) 03/12/2023   HGBA1C 6.2 (H) 07/07/2022   HGBA1C 6.2 (H) 04/07/2022   CRP <0.5 03/12/2023   REPTSTATUS  03/14/2023 FINAL 03/08/2023   REPTSTATUS 03/14/2023 FINAL 03/08/2023   CULT  03/08/2023    NO GROWTH 5 DAYS Performed at [facility], [address], [place], [place] [address]    CULT  03/08/2023    NO GROWTH 5 DAYS Performed at [facility], [address], [place], [place] [address]      Lab Results  Component Value Date   ALBUMIN 4.2 03/11/2023   ALBUMIN 3.9 03/09/2023   ALBUMIN 4.4 03/08/2023    Lab Results  Component Value Date   MG 1.9 03/13/2023   MG 1.7 03/11/2023   No results found for: "VD25OH"  No results found for: "PREALBUMIN"    Latest Ref Rng & Units 03/15/2023    2:11 AM 03/14/2023    2:24 AM 03/13/2023    2:04 AM  CBC EXTENDED  WBC 4.0 - 10.5 K/uL 6.9  6.0  4.9   RBC 4.22 - 5.81 MIL/uL 4.18  3.83  3.94   Hemoglobin 13.0 - 17.0 g/dL 46.9  62.9  52.8   HCT 39.0 - 52.0 % 37.6  33.2  35.1   Platelets 150 - 400 K/uL 249  257  229      There is no height or weight on file to calculate BMI.  Orders:  No orders of the defined types were placed in this encounter.  No  orders of the defined types were placed in this encounter.    Procedures: No procedures performed  Clinical Data: No additional findings.  ROS:  All other systems negative, except as noted in the HPI. Review of Systems  Objective: Vital Signs: There were no vitals taken for this visit.  Specialty Comments:  No specialty comments available.  PMFS History: Patient Active Problem List   Diagnosis Date Noted   Elevated troponin 03/12/2023   Frequent PVCs 03/12/2023   Hypokalemia 03/12/2023   Prolonged QT interval 03/12/2023   Controlled type 2 diabetes mellitus without complication, without long-term current use of insulin (HCC) 03/12/2023   Anxiety 03/12/2023   History of sarcoma 03/12/2023   Marijuana use 03/12/2023   Impaired glucose tolerance 12/02/2018   Marijuana abuse 03/27/2017   GERD (gastroesophageal reflux disease) 03/19/2017   Depression with  anxiety 03/19/2017   Intractable nausea and vomiting 03/19/2017   Abdominal pain 03/19/2017   GAD (generalized anxiety disorder) 02/13/2017   Hx of BKA, left (HCC) 11/16/2016   Hypercholesterolemia 01/28/2011   Bone sarcoma (HCC) 01/28/2011   Past Medical History:  Diagnosis Date   Angiosarcoma (HCC)    Of the left leg   Atypical mole 07/14/1996   Upper Mid Back (slight)   Atypical nevus 07/14/1996   Mid/Lower Right Back (slight to moderate)   Atypical nevus 12/28/1996   Right Mid Back (slight to moderate)   Atypical nevus 12/28/1996   Mid Lower Back (slight to moderate) (widershave)   Atypical nevus 10/26/2000   Right Abdomen (moderate)   Atypical nevus 07/27/2003   Left Lower Back (moderate) (widershave)   Atypical nevus 07/27/2003   Left Lower Back (mid) (slight) (widershave)   Atypical nevus 07/27/2003   Right Lower Back (inf) (slight) (widershave)   Atypical nevus 07/27/2003   Right Lower Back (sup) (slight) (widershave)   Atypical nevus 07/27/2003   Right Back (slight)   Atypical nevus 07/27/2003   Right Center Back (sup) (slight)   Atypical nevus 07/27/2003   Right Center Back (inf) (mild)   Atypical nevus 07/27/2003   Right Side (mild)   Atypical nevus 07/27/2003   Right Mid Back (slight) (4mm punch widershave)   Atypical nevus 03/10/2005   Left Abdomen (moderate to severe) [name]   Atypical nevus 03/10/2005   Left Upper Back,sup (moderate)   Atypical nevus 03/10/2005   Left Upper Back,medial (moderate) (observe)   Atypical nevus 03/10/2005   Left Upper Back, inf (moderate)   Atypical nevus 04/22/2006   Right Mid Back (marked) (widershave)   Bone cancer (HCC)    has left BK amputation   Chest pain April 2010   Myocardial Infarction ruled out   Dyslipidemia    Essential hypertension    Gastritis April 2010   Acute--Resolved   Hiatal hernia    Marijuana abuse    History of Marijuana use   Melanoma (HCC) 10/02/2019   LEFT LOWER BACK MM INSITU    Nausea alone    Recurrent   Neck problem    Vomiting     Family History  Problem Relation Age of Onset   Diabetes Mother    Bladder Cancer Father    Crohn's disease Brother     Past Surgical History:  Procedure Laterality Date   AMPUTATION     Left leg below the knee    APPENDECTOMY     CERVICAL LAMINECTOMY     Social History   Occupational History   Not on file  Tobacco Use  Smoking status: Never   Smokeless tobacco: Never  Vaping Use   Vaping status: Never Used  Substance and Sexual Activity   Alcohol use: No    Comment: rare   Drug use: Yes    Types: Marijuana    Comment: 3 times a week   Sexual activity: Not on file

## 2023-03-23 LAB — BASIC METABOLIC PANEL
BUN: 17 mg/dL (ref 7–25)
CO2: 28 mmol/L (ref 20–32)
Calcium: 9.1 mg/dL (ref 8.6–10.3)
Chloride: 102 mmol/L (ref 98–110)
Creat: 1.06 mg/dL (ref 0.70–1.35)
Glucose, Bld: 99 mg/dL (ref 65–99)
Potassium: 4.2 mmol/L (ref 3.5–5.3)
Sodium: 139 mmol/L (ref 135–146)

## 2023-04-05 DIAGNOSIS — F411 Generalized anxiety disorder: Secondary | ICD-10-CM | POA: Diagnosis not present

## 2023-04-05 NOTE — Progress Notes (Shared)
Annual Wellness Visit    Patient Care Team: Baxley, [name], MD as PCP - General (Internal Medicine) Janalyn Harder, MD (Inactive) as Consulting Physician (Dermatology)  Visit Date: 04/05/23   No chief complaint on file.   Subjective:   Patient: Benjamin Valdez, Male    DOB: 05-Dec-1960, 62 y.o.   MRN: [id number]  Benjamin Valdez is a 62 y.o. Male who presents today for his Annual Wellness Visit. History of hyperlipidemia, hypertension, gastritis, hiatal hernia, melanoma 2021, angiosarcoma left leg with below-knee amputation.  History of anxiety and depression treated with alprazolam 1 mg daily as needed, fluoxetine 40 mg daily, quetiapine 150 mg daily. Seen by Dr. Ellamae Sia and Georgette Shell.   History of GERD treated with omeprazole 40 mg daily.   History of hyperlipidemia treated with rosuvastatin 20 mg daily.  He has a history of cyclical vomiting.  History of melanoma in situ of left lower back previously seen by Dr. Jorja Loa but he is looking now for new dermatologist since Dr. Jorja Loa has left the area.  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  He has a leg prosthesis.  His parents are deceased.  He stayed with them for a long time and took care of them.  He now resides alone.  He works out at [employer] but not as much as he used to.  He has a history of impaired glucose intolerance which is diet-controlled.  Remote history of fractured clavicle.  History of MRI of the left shoulder in 2012 showing a probable labral tear.  He had a normal nuclear stress test in 2009.   Seen in ED for nausea/vomiting on 03/08/23 and again on 03/09/23. Admitted to [facility] from 03/11/23 - 03/16/23 for intractable nausea and vomiting. He was smoking a significant amount of cannabis and believes that this contributed to cyclical vomiting.He has hx of cyclical vomiting and has had ED visits from time to time to receive anti-emetic meds and IVFs. He attributes this  increased  cannabis smoking to an increase in sedentary lifestyle due to recent surgical treatment for nonhealing BKA wound. On 03/11/23, CT abdomen showed no acute intraabdominal or pelvic pathology, mild fatty infiltration liver. UDS on 03/13/23 showed benzodiazepines, opiates, THC. Potassium was as low as 3.2 on 03/14/23 and improved to 3.5 on 03/15/23. Magnesium low-normal at 1.7 on 03/11/23 and improved to 1.9 on 03/13/23. BMET checked on 03/22/23 and was normal.  Status post revision transtibial amputation in [place] on 02/22/23. Completed course of doxycycline and was given oxycodone for pain.   Colonoscopy last completed 01/04/23. Showed hemorrhoids on perianal exam, one polyp in transverse colon, three polyps in rectum, sigmoid colon. Pathology showed 1 tubular adenoma and 3 hyperplastic polyps.  Social history: Never married.  Does not smoke.  No alcohol consumption.  4-year college degree.  He receives disability benefits.   Family history: 1 brother with history of Crohn's disease.  Mother had osteoporosis, hyperlipidemia, atrial fibrillation, history of MI, mitral regurgitation and stent placement along with diabetes mellitus.  She was in her 90s when she passed away.  Father was in his late 81s with history of chronic back pain from lumbar stenosis, bladder cancer, diabetes mellitus, coronary stent placement, hyperlipidemia, hypertension, COPD and obstructive sleep apnea.  1 sister age 60 in good health.   Vaccine counseling: UTD on tetanus, flu vaccines.  Past Medical History:  Diagnosis Date   Angiosarcoma (HCC)    Of  the left leg   Atypical mole 07/14/1996   Upper Mid Back (slight)   Atypical nevus 07/14/1996   Mid/Lower Right Back (slight to moderate)   Atypical nevus 12/28/1996   Right Mid Back (slight to moderate)   Atypical nevus 12/28/1996   Mid Lower Back (slight to moderate) [name]   Atypical nevus 10/26/2000   Right Abdomen (moderate)   Atypical nevus  07/27/2003   Left Lower Back (moderate) (widershave)   Atypical nevus 07/27/2003   Left Lower Back (mid) (slight) (widershave)   Atypical nevus 07/27/2003   Right Lower Back (inf) (slight) (widershave)   Atypical nevus 07/27/2003   Right Lower Back (sup) (slight) (widershave)   Atypical nevus 07/27/2003   Right Back (slight)   Atypical nevus 07/27/2003   Right Center Back (sup) (slight)   Atypical nevus 07/27/2003   Right Center Back (inf) (mild)   Atypical nevus 07/27/2003   Right Side (mild)   Atypical nevus 07/27/2003   Right Mid Back (slight) (4mm punch widershave)   Atypical nevus 03/10/2005   Left Abdomen (moderate to severe) [name]   Atypical nevus 03/10/2005   Left Upper Back,sup (moderate)   Atypical nevus 03/10/2005   Left Upper Back,medial (moderate) (observe)   Atypical nevus 03/10/2005   Left Upper Back, inf (moderate)   Atypical nevus 04/22/2006   Right Mid Back (marked) (widershave)   Bone cancer (HCC)    has left BK amputation   Chest pain April 2010   Myocardial Infarction ruled out   Dyslipidemia    Essential hypertension    Gastritis April 2010   Acute--Resolved   Hiatal hernia    Marijuana abuse    History of Marijuana use   Melanoma (HCC) 10/02/2019   LEFT LOWER BACK MM INSITU   Nausea alone    Recurrent   Neck problem    Vomiting      Family History  Problem Relation Age of Onset   Diabetes Mother    Bladder Cancer Father    Crohn's disease Brother      Social History   Social History Narrative   Wears glasses    Right handed    Drinks coffee 3 cups per week   Drinks sodas 3-4 times per week.     ROS    Objective:   Vitals: There were no vitals taken for this visit.  Physical Exam   Most recent functional status assessment:    03/12/2023    3:07 AM  In your present state of health, do you have any difficulty performing the following activities:  Hearing? 0  Vision? 0  Difficulty concentrating or making  decisions? 0  Doing errands, shopping? 0   Most recent fall risk assessment:    07/09/2022    3:40 PM  Fall Risk   Falls in the past year? 0  Number falls in past yr: 0  Injury with Fall? 0  Risk for fall due to : No Fall Risks  Follow up Falls prevention discussed    Most recent depression screenings:    06/29/2022   12:09 PM 04/07/2022    2:58 PM  PHQ 2/9 Scores  PHQ - 2 Score 2 0  PHQ- 9 Score 4    Most recent cognitive screening:    04/07/2022    3:01 PM  6CIT Screen  What Year? 0 points  What month? 0 points  What time? 0 points  Count back from 20 0 points  Months in reverse 0 points  Repeat phrase 0 points  Total Score 0 points     Results:   Studies obtained and personally reviewed by me:  Imaging, colonoscopy, mammogram, bone density scan, echocardiogram, heart cath, stress test, CT calcium score, etc. ***   Labs:       Component Value Date/Time   NA 139 03/22/2023 1016   K 4.2 03/22/2023 1016   CL 102 03/22/2023 1016   CO2 28 03/22/2023 1016   GLUCOSE 99 03/22/2023 1016   BUN 17 03/22/2023 1016   CREATININE 1.06 03/22/2023 1016   CALCIUM 9.1 03/22/2023 1016   PROT 7.9 03/11/2023 1955   ALBUMIN 4.2 03/11/2023 1955   AST 27 03/11/2023 1955   ALT 21 03/11/2023 1955   ALKPHOS 79 03/11/2023 1955   BILITOT 1.2 (H) 03/11/2023 1955   GFRNONAA >60 03/15/2023 0211   GFRNONAA 57 (L) 12/29/2019 0935   GFRAA 67 12/29/2019 0935     Lab Results  Component Value Date   WBC 6.9 03/15/2023   HGB 12.2 (L) 03/15/2023   HCT 37.6 (L) 03/15/2023   MCV 90.0 03/15/2023   PLT 249 03/15/2023    Lab Results  Component Value Date   CHOL 176 07/07/2022   HDL 58 07/07/2022   LDLCALC 99 07/07/2022   TRIG 97 07/07/2022   CHOLHDL 3.0 07/07/2022    Lab Results  Component Value Date   HGBA1C 6.0 (H) 03/12/2023     Lab Results  Component Value Date   TSH 2.76 10/13/2022     Lab Results  Component Value Date   PSA 1.31 04/07/2022   PSA 1.35  02/25/2021   PSA 0.9 12/29/2019   *** delete for male pts  ***  Assessment & Plan:   ***      Annual wellness visit done today including the all of the following: Reviewed patient's Family Medical History Reviewed and updated list of patient's medical providers Assessment of cognitive impairment was done Assessed patient's functional ability Established a written schedule for health screening services Health Risk Assessent Completed and Reviewed  Discussed health benefits of physical activity, and encouraged him to engage in regular exercise appropriate for his age and condition.        I,Alexander Ruley,acting as a Neurosurgeon for Margaree Mackintosh, MD.,have documented all relevant documentation on the behalf of Margaree Mackintosh, MD,as directed by  Margaree Mackintosh, MD while in the presence of Margaree Mackintosh, MD.   ***

## 2023-04-07 DIAGNOSIS — F331 Major depressive disorder, recurrent, moderate: Secondary | ICD-10-CM | POA: Diagnosis not present

## 2023-04-07 DIAGNOSIS — F411 Generalized anxiety disorder: Secondary | ICD-10-CM | POA: Diagnosis not present

## 2023-04-09 ENCOUNTER — Telehealth: Payer: Self-pay | Admitting: Internal Medicine

## 2023-04-09 ENCOUNTER — Other Ambulatory Visit: Payer: Medicare HMO

## 2023-04-09 DIAGNOSIS — Z125 Encounter for screening for malignant neoplasm of prostate: Secondary | ICD-10-CM

## 2023-04-09 DIAGNOSIS — Z0000 Encounter for general adult medical examination without abnormal findings: Secondary | ICD-10-CM

## 2023-04-09 DIAGNOSIS — E7800 Pure hypercholesterolemia, unspecified: Secondary | ICD-10-CM

## 2023-04-09 DIAGNOSIS — Z87898 Personal history of other specified conditions: Secondary | ICD-10-CM

## 2023-04-09 DIAGNOSIS — F419 Anxiety disorder, unspecified: Secondary | ICD-10-CM

## 2023-04-09 NOTE — Telephone Encounter (Signed)
LVM again, just trying to reach out since our calls go to the call center.

## 2023-04-09 NOTE — Telephone Encounter (Addendum)
LVM for patient to CB, Dr Lenord Fellers wants to know why he cancel labs and CPE for 04/09/2023 and 04/12/2023, these need to be done by years end not next year.

## 2023-04-12 ENCOUNTER — Ambulatory Visit: Payer: Medicare HMO | Admitting: Internal Medicine

## 2023-04-12 VITALS — BP 110/88 | HR 73 | Ht 72.0 in | Wt 181.0 lb

## 2023-04-12 DIAGNOSIS — R1115 Cyclical vomiting syndrome unrelated to migraine: Secondary | ICD-10-CM | POA: Diagnosis not present

## 2023-04-12 DIAGNOSIS — Z0000 Encounter for general adult medical examination without abnormal findings: Secondary | ICD-10-CM

## 2023-04-12 DIAGNOSIS — Z89512 Acquired absence of left leg below knee: Secondary | ICD-10-CM | POA: Diagnosis not present

## 2023-04-12 DIAGNOSIS — E7800 Pure hypercholesterolemia, unspecified: Secondary | ICD-10-CM

## 2023-04-12 DIAGNOSIS — F1290 Cannabis use, unspecified, uncomplicated: Secondary | ICD-10-CM

## 2023-04-12 DIAGNOSIS — Z23 Encounter for immunization: Secondary | ICD-10-CM

## 2023-04-12 DIAGNOSIS — F419 Anxiety disorder, unspecified: Secondary | ICD-10-CM | POA: Diagnosis not present

## 2023-04-12 DIAGNOSIS — F32A Depression, unspecified: Secondary | ICD-10-CM

## 2023-04-12 DIAGNOSIS — E119 Type 2 diabetes mellitus without complications: Secondary | ICD-10-CM

## 2023-04-12 DIAGNOSIS — Z85820 Personal history of malignant melanoma of skin: Secondary | ICD-10-CM | POA: Diagnosis not present

## 2023-04-12 LAB — POCT URINALYSIS DIP (CLINITEK)
Bilirubin, UA: NEGATIVE
Blood, UA: NEGATIVE
Glucose, UA: NEGATIVE mg/dL
Ketones, POC UA: NEGATIVE mg/dL
Leukocytes, UA: NEGATIVE
Nitrite, UA: NEGATIVE
POC PROTEIN,UA: NEGATIVE
Spec Grav, UA: 1.015 (ref 1.010–1.025)
Urobilinogen, UA: 0.2 U/dL
pH, UA: 6 (ref 5.0–8.0)

## 2023-04-12 NOTE — Progress Notes (Shared)
Annual Wellness Visit    Patient Care Team: Baxley, [name], MD as PCP - General (Internal Medicine) Benjamin Harder, MD (Inactive) as Consulting Physician (Dermatology)  Visit Date: 04/12/23   Chief Complaint  Patient presents with   Medicare Wellness    Subjective:   Patient: Benjamin Valdez, Male    DOB: 1960-05-16, 62 y.o.   MRN: [id number]  Benjamin Valdez is a 62 y.o. Male who presents today for his Annual Wellness Visit. History of hyperlipidemia, hypertension, gastritis, hiatal hernia, melanoma 2021, angiosarcoma left leg with below-knee amputation.   History of anxiety and depression treated with alprazolam 1 mg daily as needed, fluoxetine 40 mg daily, quetiapine 150 mg daily. Seen by Dr. Ellamae Valdez and Benjamin Valdez.   Reports intermittent sharp pain at distal anterior left lower residual limb that resolves immediately. Denies drainage, redness at wound.   History of GERD treated with omeprazole 40 mg daily.    History of hyperlipidemia treated with rosuvastatin 20 mg daily.   He has a history of cyclical vomiting.  History of melanoma in situ of left lower back previously seen by Dr. Jorja Valdez but he is looking now for new dermatologist since Dr. Jorja Valdez has left the area.  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  He has a leg prosthesis.  His parents are deceased.  He stayed with them for a long time and took care of them.  He now resides alone.  He works out at [employer] but not as much as he used to.  He has a history of impaired glucose intolerance which is diet-controlled.  Remote history of fractured clavicle.  History of MRI of the left shoulder in 2012 showing a probable labral tear.  He had a normal nuclear stress test in 2009.    Seen in ED for nausea/vomiting on 03/08/23 and again on 03/09/23. Admitted to [facility] from 03/11/23 - 03/16/23 for intractable nausea and vomiting. He was smoking a significant amount of cannabis and  believes that this contributed to cyclical vomiting.He has hx of cyclical vomiting and has had ED visits from time to time to receive anti-emetic meds and IVFs. He attributes this increased  cannabis smoking to an increase in sedentary lifestyle due to recent surgical treatment for nonhealing BKA wound. On 03/11/23, CT abdomen showed no acute intraabdominal or pelvic pathology, mild fatty infiltration liver. UDS on 03/13/23 showed benzodiazepines, opiates, THC. Potassium was as low as 3.2 on 03/14/23 and improved to 3.5 on 03/15/23. Magnesium low-normal at 1.7 on 03/11/23 and improved to 1.9 on 03/13/23. BMET checked on 03/22/23 and was normal.   Status post revision transtibial amputation in [place] on 02/22/23. Completed course of doxycycline and was given oxycodone for pain. Follow-up with orthopedic surgery and plastic surgery on 04/23/23 and plans to have stiches removed.   Colonoscopy last completed 01/04/23. Showed hemorrhoids on perianal exam, one polyp in transverse colon, three polyps in rectum, sigmoid colon. Pathology showed 1 tubular adenoma and 3 hyperplastic polyps.   Social history: Never married.  Does not smoke.  No alcohol consumption.  4-year college degree.  He receives disability benefits.   Family history: 1 brother with history of Crohn's disease.  Mother had osteoporosis, hyperlipidemia, atrial fibrillation, history of MI, mitral regurgitation and stent placement along with diabetes mellitus.  She was in her 90s when she passed away.  Father was in his late 61s with history of chronic back pain from  lumbar stenosis, bladder cancer, diabetes mellitus, coronary stent placement, hyperlipidemia, hypertension, COPD and obstructive sleep apnea.  1 sister age 15 in good health.  Past Medical History:  Diagnosis Date   Angiosarcoma (HCC)    Of the left leg   Atypical mole 07/14/1996   Upper Mid Back (slight)   Atypical nevus 07/14/1996   Mid/Lower Right Back (slight to moderate)    Atypical nevus 12/28/1996   Right Mid Back (slight to moderate)   Atypical nevus 12/28/1996   Mid Lower Back (slight to moderate) Benjamin Valdez)   Atypical nevus 10/26/2000   Right Abdomen (moderate)   Atypical nevus 07/27/2003   Left Lower Back (moderate) (widershave)   Atypical nevus 07/27/2003   Left Lower Back (mid) (slight) (widershave)   Atypical nevus 07/27/2003   Right Lower Back (inf) (slight) (widershave)   Atypical nevus 07/27/2003   Right Lower Back (sup) (slight) (widershave)   Atypical nevus 07/27/2003   Right Back (slight)   Atypical nevus 07/27/2003   Right Center Back (sup) (slight)   Atypical nevus 07/27/2003   Right Center Back (inf) (mild)   Atypical nevus 07/27/2003   Right Side (mild)   Atypical nevus 07/27/2003   Right Mid Back (slight) (4mm punch widershave)   Atypical nevus 03/10/2005   Left Abdomen (moderate to severe) Benjamin Valdez)   Atypical nevus 03/10/2005   Left Upper Back,sup (moderate)   Atypical nevus 03/10/2005   Left Upper Back,medial (moderate) (observe)   Atypical nevus 03/10/2005   Left Upper Back, inf (moderate)   Atypical nevus 04/22/2006   Right Mid Back (marked) (widershave)   Bone cancer (HCC)    has left BK amputation   Chest pain April 2010   Myocardial Infarction ruled out   Dyslipidemia    Essential hypertension    Gastritis April 2010   Acute--Resolved   Hiatal hernia    Marijuana abuse    History of Marijuana use   Melanoma (HCC) 10/02/2019   LEFT LOWER BACK MM INSITU   Nausea alone    Recurrent   Neck problem    Vomiting      Family History  Problem Relation Age of Onset   Diabetes Mother    Bladder Cancer Father    Crohn's disease Brother      Social History   Social History Narrative   Wears glasses    Right handed    Drinks coffee 3 cups per week   Drinks sodas 3-4 times per week.     Review of Systems  Constitutional:  Negative for chills, fever, malaise/fatigue and weight loss.  HENT:   Negative for hearing loss, sinus pain and sore throat.   Respiratory:  Negative for cough, hemoptysis and shortness of breath.   Cardiovascular:  Negative for chest pain, palpitations, leg swelling and PND.  Gastrointestinal:  Negative for abdominal pain, constipation, diarrhea, heartburn, nausea and vomiting.  Genitourinary:  Negative for dysuria, frequency and urgency.  Musculoskeletal:  Negative for back pain, myalgias and neck pain.       (+) Pain at distal left lower residual limb  Skin:  Negative for itching and rash.  Neurological:  Negative for dizziness, tingling, seizures and headaches.  Endo/Heme/Allergies:  Negative for polydipsia.  Psychiatric/Behavioral:  Negative for depression. The patient is not nervous/anxious.       Objective:   Vitals: BP 110/88   Pulse 73   Ht 6' (1.829 m)   Wt 181 lb (82.1 kg)   SpO2 95%   BMI 24.55  kg/m   Physical Exam Vitals and nursing note reviewed.  Constitutional:      General: He is awake. He is not in acute distress.    Appearance: Normal appearance. He is not ill-appearing or toxic-appearing.  HENT:     Head: Normocephalic and atraumatic.     Right Ear: Hearing, tympanic membrane, ear canal and external ear normal.     Left Ear: Hearing, tympanic membrane, ear canal and external ear normal.     Mouth/Throat:     Pharynx: Oropharynx is clear.  Eyes:     Extraocular Movements: Extraocular movements intact.     Pupils: Pupils are equal, round, and reactive to light.  Neck:     Thyroid: No thyroid mass, thyromegaly or thyroid tenderness.     Vascular: No carotid bruit.  Cardiovascular:     Rate and Rhythm: Normal rate and regular rhythm. No extrasystoles are present.    Pulses:          Dorsalis pedis pulses are 1+ on the right side and 1+ on the left side.     Heart sounds: Normal heart sounds. No murmur heard.    No friction rub. No gallop.  Pulmonary:     Effort: Pulmonary effort is normal.     Breath sounds: Normal  breath sounds. No decreased breath sounds, wheezing, rhonchi or rales.  Chest:     Chest wall: No mass.  Abdominal:     Palpations: Abdomen is soft. There is no hepatomegaly, splenomegaly or mass.     Tenderness: There is no abdominal tenderness.     Hernia: No hernia is present.  Musculoskeletal:     Cervical back: Normal range of motion.     Right lower leg: No edema.     Left lower leg: No edema.  Lymphadenopathy:     Cervical: No cervical adenopathy.     Upper Body:     Right upper body: No supraclavicular adenopathy.     Left upper body: No supraclavicular adenopathy.  Skin:    General: Skin is warm and dry.     Comments: Wound at distal left lower residual limb healing well.  Neurological:     General: No focal deficit present.     Mental Status: He is alert and oriented to person, place, and time. Mental status is at baseline.     Cranial Nerves: Cranial nerves 2-12 are intact.     Sensory: Sensation is intact.     Motor: Motor function is intact.     Coordination: Coordination is intact.     Gait: Gait is intact.     Deep Tendon Reflexes: Reflexes are normal and symmetric.  Psychiatric:        Attention and Perception: Attention normal.        Mood and Affect: Mood normal.        Speech: Speech normal.        Behavior: Behavior normal. Behavior is cooperative.        Thought Content: Thought content normal.        Cognition and Memory: Cognition and memory normal.        Judgment: Judgment normal.      Most recent functional status assessment:    04/12/2023    3:07 PM  In your present state of health, do you have any difficulty performing the following activities:  Hearing? 0  Vision? 0  Difficulty concentrating or making decisions? 0  Walking or climbing stairs? 0  Dressing or  bathing? 0  Doing errands, shopping? 0  Preparing Food and eating ? N  Using the Toilet? N  In the past six months, have you accidently leaked urine? N  Do you have problems with  loss of bowel control? N  Managing your Medications? N  Managing your Finances? N  Housekeeping or managing your Housekeeping? N   Most recent fall risk assessment:    04/12/2023    3:09 PM  Fall Risk   Falls in the past year? 1  Number falls in past yr: 0  Injury with Fall? 0  Risk for fall due to : Other (Comment)  Follow up Falls evaluation completed;Education provided;Falls prevention discussed    Most recent depression screenings:    06/29/2022   12:09 PM 04/07/2022    2:58 PM  PHQ 2/9 Scores  PHQ - 2 Score 2 0  PHQ- 9 Score 4    Most recent cognitive screening:    04/12/2023    3:08 PM  6CIT Screen  What Year? 0 points  What month? 0 points  What time? 0 points  Count back from 20 0 points  Months in reverse 0 points  Repeat phrase 0 points  Total Score 0 points     Results:   Studies obtained and personally reviewed by me:  Colonoscopy last completed 01/04/23. Showed hemorrhoids on perianal exam, one polyp in transverse colon, three polyps in rectum, sigmoid colon. Pathology showed 1 tubular adenoma and 3 hyperplastic polyps.  Labs:       Component Value Date/Time   NA 139 03/22/2023 1016   K 4.2 03/22/2023 1016   CL 102 03/22/2023 1016   CO2 28 03/22/2023 1016   GLUCOSE 99 03/22/2023 1016   BUN 17 03/22/2023 1016   CREATININE 1.06 03/22/2023 1016   CALCIUM 9.1 03/22/2023 1016   PROT 7.9 03/11/2023 1955   ALBUMIN 4.2 03/11/2023 1955   AST 27 03/11/2023 1955   ALT 21 03/11/2023 1955   ALKPHOS 79 03/11/2023 1955   BILITOT 1.2 (H) 03/11/2023 1955   GFRNONAA >60 03/15/2023 0211   GFRNONAA 57 (L) 12/29/2019 0935   GFRAA 67 12/29/2019 0935     Lab Results  Component Value Date   WBC 6.9 03/15/2023   HGB 12.2 (L) 03/15/2023   HCT 37.6 (L) 03/15/2023   MCV 90.0 03/15/2023   PLT 249 03/15/2023    Lab Results  Component Value Date   CHOL 176 07/07/2022   HDL 58 07/07/2022   LDLCALC 99 07/07/2022   TRIG 97 07/07/2022   CHOLHDL 3.0  07/07/2022    Lab Results  Component Value Date   HGBA1C 6.0 (H) 03/12/2023     Lab Results  Component Value Date   TSH 2.76 10/13/2022     Lab Results  Component Value Date   PSA 1.31 04/07/2022   PSA 1.35 02/25/2021   PSA 0.9 12/29/2019    Assessment & Plan:   Anxiety and depression: stable with alprazolam 1 mg daily as needed, fluoxetine 40 mg daily, quetiapine 150 mg daily. Seen by Dr. Ellamae Valdez and Benjamin Valdez.    GERD: stable with omeprazole 40 mg daily.    Hyperlipidemia: treated with rosuvastatin 20 mg daily.  Remote history of left BKA due to sarcoma 1998. Has a leg prosthesis and is seen annually at [facility].   History of cyclical vomiting and takes Zofran.   Remote history of fractured clavicle.  Prostate exam deferred at patient's request.  Urinalysis today normal.  Ordered urine creatinine/microalbumin.  Colonoscopy last completed 01/04/23. Showed hemorrhoids on perianal exam, one polyp in transverse colon, three polyps in rectum, sigmoid colon. Pathology showed 1 tubular adenoma and 3 hyperplastic polyps.  Vaccine counseling: UTD on tetanus, flu vaccines.  Return after the holidays for fasting labs.     Annual wellness visit done today including the all of the following: Reviewed patient's Family Medical History Reviewed and updated list of patient's medical providers Assessment of cognitive impairment was done Assessed patient's functional ability Established a written schedule for health screening services Health Risk Assessent Completed and Reviewed  Discussed health benefits of physical activity, and encouraged him to engage in regular exercise appropriate for his age and condition.        I,Alexander Ruley,acting as a Neurosurgeon for Margaree Mackintosh, MD.,have documented all relevant documentation on the behalf of Margaree Mackintosh, MD,as directed by  Margaree Mackintosh, MD while in the presence of Margaree Mackintosh, MD.   ***

## 2023-04-13 LAB — MICROALBUMIN / CREATININE URINE RATIO
Creatinine, Urine: 154 mg/dL (ref 20–320)
Microalb Creat Ratio: 1 mg/g{creat} (ref <30)
Microalb, Ur: 0.2 mg/dL

## 2023-04-20 ENCOUNTER — Encounter: Payer: Self-pay | Admitting: Internal Medicine

## 2023-04-27 ENCOUNTER — Other Ambulatory Visit: Payer: Medicare HMO

## 2023-04-28 DIAGNOSIS — F411 Generalized anxiety disorder: Secondary | ICD-10-CM | POA: Diagnosis not present

## 2023-04-28 DIAGNOSIS — F331 Major depressive disorder, recurrent, moderate: Secondary | ICD-10-CM | POA: Diagnosis not present

## 2023-05-03 DIAGNOSIS — Z86018 Personal history of other benign neoplasm: Secondary | ICD-10-CM | POA: Diagnosis not present

## 2023-05-03 DIAGNOSIS — Z85820 Personal history of malignant melanoma of skin: Secondary | ICD-10-CM | POA: Diagnosis not present

## 2023-05-03 DIAGNOSIS — L578 Other skin changes due to chronic exposure to nonionizing radiation: Secondary | ICD-10-CM | POA: Diagnosis not present

## 2023-05-03 DIAGNOSIS — L821 Other seborrheic keratosis: Secondary | ICD-10-CM | POA: Diagnosis not present

## 2023-05-03 DIAGNOSIS — D229 Melanocytic nevi, unspecified: Secondary | ICD-10-CM | POA: Diagnosis not present

## 2023-05-03 DIAGNOSIS — L814 Other melanin hyperpigmentation: Secondary | ICD-10-CM | POA: Diagnosis not present

## 2023-05-04 ENCOUNTER — Other Ambulatory Visit: Payer: Medicare HMO

## 2023-05-04 DIAGNOSIS — Z125 Encounter for screening for malignant neoplasm of prostate: Secondary | ICD-10-CM | POA: Diagnosis not present

## 2023-05-04 DIAGNOSIS — Z87898 Personal history of other specified conditions: Secondary | ICD-10-CM | POA: Diagnosis not present

## 2023-05-04 DIAGNOSIS — F32A Depression, unspecified: Secondary | ICD-10-CM | POA: Diagnosis not present

## 2023-05-04 DIAGNOSIS — Z0000 Encounter for general adult medical examination without abnormal findings: Secondary | ICD-10-CM

## 2023-05-04 DIAGNOSIS — E119 Type 2 diabetes mellitus without complications: Secondary | ICD-10-CM | POA: Diagnosis not present

## 2023-05-04 DIAGNOSIS — F419 Anxiety disorder, unspecified: Secondary | ICD-10-CM | POA: Diagnosis not present

## 2023-05-04 DIAGNOSIS — E7800 Pure hypercholesterolemia, unspecified: Secondary | ICD-10-CM | POA: Diagnosis not present

## 2023-05-05 LAB — CBC WITH DIFFERENTIAL/PLATELET
Absolute Lymphocytes: 1066 {cells}/uL (ref 850–3900)
Absolute Monocytes: 576 {cells}/uL (ref 200–950)
Basophils Absolute: 51 {cells}/uL (ref 0–200)
Basophils Relative: 1 %
Eosinophils Absolute: 209 {cells}/uL (ref 15–500)
Eosinophils Relative: 4.1 %
HCT: 38.6 % (ref 38.5–50.0)
Hemoglobin: 12.6 g/dL — ABNORMAL LOW (ref 13.2–17.1)
MCH: 30.2 pg (ref 27.0–33.0)
MCHC: 32.6 g/dL (ref 32.0–36.0)
MCV: 92.6 fL (ref 80.0–100.0)
MPV: 11.2 fL (ref 7.5–12.5)
Monocytes Relative: 11.3 %
Neutro Abs: 3198 {cells}/uL (ref 1500–7800)
Neutrophils Relative %: 62.7 %
Platelets: 227 10*3/uL (ref 140–400)
RBC: 4.17 10*6/uL — ABNORMAL LOW (ref 4.20–5.80)
RDW: 13.1 % (ref 11.0–15.0)
Total Lymphocyte: 20.9 %
WBC: 5.1 10*3/uL (ref 3.8–10.8)

## 2023-05-05 LAB — COMPLETE METABOLIC PANEL WITH GFR
AG Ratio: 1.9 (calc) (ref 1.0–2.5)
ALT: 9 U/L (ref 9–46)
AST: 12 U/L (ref 10–35)
Albumin: 4.1 g/dL (ref 3.6–5.1)
Alkaline phosphatase (APISO): 86 U/L (ref 35–144)
BUN/Creatinine Ratio: 37 (calc) — ABNORMAL HIGH (ref 6–22)
BUN: 38 mg/dL — ABNORMAL HIGH (ref 7–25)
CO2: 27 mmol/L (ref 20–32)
Calcium: 9.4 mg/dL (ref 8.6–10.3)
Chloride: 107 mmol/L (ref 98–110)
Creat: 1.03 mg/dL (ref 0.70–1.35)
Globulin: 2.2 g/dL (ref 1.9–3.7)
Glucose, Bld: 103 mg/dL — ABNORMAL HIGH (ref 65–99)
Potassium: 4.6 mmol/L (ref 3.5–5.3)
Sodium: 143 mmol/L (ref 135–146)
Total Bilirubin: 0.3 mg/dL (ref 0.2–1.2)
Total Protein: 6.3 g/dL (ref 6.1–8.1)
eGFR: 83 mL/min/{1.73_m2} (ref >=60)

## 2023-05-05 LAB — HEMOGLOBIN A1C
Hgb A1c MFr Bld: 5.9 %{Hb} — ABNORMAL HIGH (ref <5.7)
Mean Plasma Glucose: 123 mg/dL
eAG (mmol/L): 6.8 mmol/L

## 2023-05-05 LAB — LIPID PANEL
Cholesterol: 183 mg/dL (ref <200)
HDL: 53 mg/dL (ref >=40)
LDL Cholesterol (Calc): 109 mg/dL — ABNORMAL HIGH
Non-HDL Cholesterol (Calc): 130 mg/dL — ABNORMAL HIGH (ref <130)
Total CHOL/HDL Ratio: 3.5 (calc) (ref <5.0)
Triglycerides: 106 mg/dL (ref <150)

## 2023-05-05 LAB — MICROALBUMIN / CREATININE URINE RATIO
Creatinine, Urine: 110 mg/dL (ref 20–320)
Microalb, Ur: <0.2 mg/dL

## 2023-05-05 LAB — PSA: PSA: 1 ng/mL (ref <=4.00)

## 2023-05-11 ENCOUNTER — Encounter (HOSPITAL_BASED_OUTPATIENT_CLINIC_OR_DEPARTMENT_OTHER): Payer: Self-pay

## 2023-05-11 ENCOUNTER — Emergency Department (HOSPITAL_BASED_OUTPATIENT_CLINIC_OR_DEPARTMENT_OTHER)
Admission: EM | Admit: 2023-05-11 | Discharge: 2023-05-11 | Disposition: A | Discharge status: Home / Self Care | Payer: Medicare HMO | Attending: Emergency Medicine | Admitting: Emergency Medicine

## 2023-05-11 DIAGNOSIS — R112 Nausea with vomiting, unspecified: Secondary | ICD-10-CM | POA: Diagnosis not present

## 2023-05-11 DIAGNOSIS — Z85830 Personal history of malignant neoplasm of bone: Secondary | ICD-10-CM | POA: Insufficient documentation

## 2023-05-11 DIAGNOSIS — R000 Tachycardia, unspecified: Secondary | ICD-10-CM | POA: Diagnosis not present

## 2023-05-11 DIAGNOSIS — E119 Type 2 diabetes mellitus without complications: Secondary | ICD-10-CM | POA: Insufficient documentation

## 2023-05-11 DIAGNOSIS — Z20822 Contact with and (suspected) exposure to covid-19: Secondary | ICD-10-CM | POA: Diagnosis not present

## 2023-05-11 LAB — RESP PANEL BY RT-PCR (RSV, FLU A&B, COVID)  RVPGX2
Influenza A by PCR: NEGATIVE
Influenza B by PCR: NEGATIVE
Resp Syncytial Virus by PCR: NEGATIVE
SARS Coronavirus 2 by RT PCR: NEGATIVE

## 2023-05-11 LAB — CBC WITH DIFFERENTIAL/PLATELET
Abs Immature Granulocytes: 0.07 10*3/uL (ref 0.00–0.07)
Basophils Absolute: 0 10*3/uL (ref 0.0–0.1)
Basophils Relative: 0 %
Eosinophils Absolute: 0 10*3/uL (ref 0.0–0.5)
Eosinophils Relative: 0 %
HCT: 39.1 % (ref 39.0–52.0)
Hemoglobin: 13.3 g/dL (ref 13.0–17.0)
Immature Granulocytes: 1 %
Lymphocytes Relative: 6 %
Lymphs Abs: 0.7 10*3/uL (ref 0.7–4.0)
MCH: 29.3 pg (ref 26.0–34.0)
MCHC: 34 g/dL (ref 30.0–36.0)
MCV: 86.1 fL (ref 80.0–100.0)
Monocytes Absolute: 1.1 10*3/uL — ABNORMAL HIGH (ref 0.1–1.0)
Monocytes Relative: 9 %
Neutro Abs: 10.2 10*3/uL — ABNORMAL HIGH (ref 1.7–7.7)
Neutrophils Relative %: 84 %
Platelets: 376 10*3/uL (ref 150–400)
RBC: 4.54 MIL/uL (ref 4.22–5.81)
RDW: 13.1 % (ref 11.5–15.5)
WBC: 12.1 10*3/uL — ABNORMAL HIGH (ref 4.0–10.5)
nRBC: 0 % (ref 0.0–0.2)

## 2023-05-11 LAB — COMPREHENSIVE METABOLIC PANEL
ALT: 18 U/L (ref 0–44)
AST: 36 U/L (ref 15–41)
Albumin: 5 g/dL (ref 3.5–5.0)
Alkaline Phosphatase: 83 U/L (ref 38–126)
Anion gap: 16 — ABNORMAL HIGH (ref 5–15)
BUN: 29 mg/dL — ABNORMAL HIGH (ref 8–23)
CO2: 21 mmol/L — ABNORMAL LOW (ref 22–32)
Calcium: 10.4 mg/dL — ABNORMAL HIGH (ref 8.9–10.3)
Chloride: 103 mmol/L (ref 98–111)
Creatinine, Ser: 1.04 mg/dL (ref 0.61–1.24)
GFR, Estimated: >60 mL/min (ref >60)
Glucose, Bld: 127 mg/dL — ABNORMAL HIGH (ref 70–99)
Potassium: 3.4 mmol/L — ABNORMAL LOW (ref 3.5–5.1)
Sodium: 140 mmol/L (ref 135–145)
Total Bilirubin: 0.7 mg/dL (ref 0.0–1.2)
Total Protein: 8.9 g/dL — ABNORMAL HIGH (ref 6.5–8.1)

## 2023-05-11 LAB — URINALYSIS, MICROSCOPIC (REFLEX)
RBC / HPF: NONE SEEN RBC/hpf (ref 0–5)
WBC, UA: NONE SEEN WBC/hpf (ref 0–5)

## 2023-05-11 LAB — URINALYSIS, ROUTINE W REFLEX MICROSCOPIC
Bilirubin Urine: NEGATIVE
Glucose, UA: NEGATIVE mg/dL
Ketones, ur: NEGATIVE mg/dL
Leukocytes,Ua: NEGATIVE
Nitrite: NEGATIVE
Protein, ur: >=300 mg/dL — AB
Specific Gravity, Urine: >=1.03 (ref 1.005–1.030)
pH: 6 (ref 5.0–8.0)

## 2023-05-11 LAB — LIPASE, BLOOD: Lipase: 35 U/L (ref 11–51)

## 2023-05-11 LAB — MAGNESIUM: Magnesium: 1.9 mg/dL (ref 1.7–2.4)

## 2023-05-11 MED ORDER — METOCLOPRAMIDE HCL 10 MG PO TABS: 10.0000 mg | ORAL_TABLET | Freq: Three times a day (TID) | ORAL | 0 refills | Status: AC | PRN

## 2023-05-11 MED ORDER — FAMOTIDINE 20 MG PO TABS: 20.0000 mg | ORAL_TABLET | Freq: Two times a day (BID) | ORAL | 1 refills | Status: AC

## 2023-05-11 MED ORDER — FAMOTIDINE IN NACL 20-0.9 MG/50ML-% IV SOLN
20.0000 mg | Freq: Once | INTRAVENOUS | Status: AC
  Administered 2023-05-11: 20 mg via INTRAVENOUS
  Filled 2023-05-11: qty 50

## 2023-05-11 MED ORDER — METOCLOPRAMIDE HCL 5 MG/ML IJ SOLN
10.0000 mg | Freq: Once | INTRAMUSCULAR | Status: AC
  Administered 2023-05-11: 10 mg via INTRAVENOUS
  Filled 2023-05-11: qty 2

## 2023-05-11 MED ORDER — DOXYCYCLINE HYCLATE 100 MG IV SOLR
100.0000 mg | Freq: Once | INTRAVENOUS | Status: AC
  Administered 2023-05-11: 100 mg via INTRAVENOUS
  Filled 2023-05-11: qty 100

## 2023-05-11 MED ORDER — SODIUM CHLORIDE 0.9 % IV BOLUS
1000.0000 mL | Freq: Once | INTRAVENOUS | Status: AC
  Administered 2023-05-11: 1000 mL via INTRAVENOUS

## 2023-05-11 MED ORDER — ONDANSETRON 4 MG PO TBDP
4.0000 mg | ORAL_TABLET | Freq: Once | ORAL | Status: DC | PRN
  Filled 2023-05-11: qty 1

## 2023-05-11 MED ORDER — DROPERIDOL 2.5 MG/ML IJ SOLN
1.2500 mg | Freq: Once | INTRAMUSCULAR | Status: AC
  Administered 2023-05-11: 1.25 mg via INTRAVENOUS
  Filled 2023-05-11: qty 2

## 2023-05-11 NOTE — ED Triage Notes (Signed)
Pt reports nausea and vomiting. States it started yesterday. Unable to keep anything down. Fever per pt.   Pt has  left BKA. Surgery in Nov. States the has an infection in his residual limb. Currently taking Doxycycline.

## 2023-05-11 NOTE — ED Notes (Addendum)
Pt obtaining urine sample with beside urinal. Also troubleshot the pt's bedside call bell. It was not working when they pressed it. Confirmed function after plugging it back into the wall.

## 2023-05-11 NOTE — ED Provider Notes (Signed)
Benjamin Valdez Provider Note   CSN: [id number] Arrival date & time: 05/11/23  1619     History  Chief Complaint  Patient presents with   Vomiting    Benjamin Valdez is a 63 y.o. 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  Onset of symptoms last night.  Patient reports that he thinks this happened because he took doxycycline prescribed for stump infection, as well as oxycodone on an empty stomach.  He says he also smoked marijuana last night, although he is aware about being told about cannabis hyperemesis syndrome in the past.  He says he began having vomiting last night is not having pain in his abdomen.  His vomitus turned slightly darker coffee-ground this morning.  Denies prior history of known stomach ulcers.  I reviewed his external records.  Patient been hospitalized most recently November 2024, for cyclical vomiting or suspected cannabis hyperemesis.  CT of the abdomen pelvis did not show acute abnormalities at the time.  Patient took Phenergan suppository at noon at home  HPI     Home Medications Prior to Admission medications   Medication Sig Start Date End Date Taking? Authorizing Provider  famotidine (PEPCID) 20 MG tablet Take 1 tablet (20 mg total) by mouth 2 (two) times daily. 05/11/23 06/10/23 Yes Josedejesus Marcum, Kermit Balo, MD  metoCLOPramide (REGLAN) 10 MG tablet Take 1 tablet (10 mg total) by mouth every 8 (eight) hours as needed for up to 10 doses for refractory nausea / vomiting. 05/11/23  Yes Mehtaab Mayeda, Kermit Balo, MD  ALPRAZolam Prudy Feeler) 1 MG tablet Take 1 mg by mouth daily as needed for anxiety. 06/08/22   [provider]  FLUoxetine (PROZAC) 20 MG capsule Take 20 mg by mouth every morning. TDD 60mg     [provider]  FLUoxetine (PROZAC) 40 MG capsule Take 40 mg by mouth  daily. TDD 60mg     [provider]  omeprazole (PRILOSEC) 40 MG capsule TAKE 1 CAPSULE BY MOUTH EVERY DAY 09/09/22   Margaree Mackintosh, MD  ondansetron (ZOFRAN) 4 MG tablet Take 1 tablet (4 mg total) by mouth every 4 (four) hours as needed for nausea or vomiting. 03/16/23   Rai, Delene Ruffini, MD  promethazine (PHENERGAN) 25 MG suppository Place 1 suppository (25 mg total) rectally every 6 (six) hours as needed for nausea or vomiting. 03/13/23   Rai, Delene Ruffini, MD  promethazine (PHENERGAN) 25 MG tablet Take 1 tablet (25 mg total) by mouth every 6 (six) hours as needed for nausea or vomiting. 03/13/23   Rai, Ripudeep Kirtland Bouchard, MD  QUEtiapine (SEROQUEL) 50 MG tablet Take 150 mg by mouth at bedtime. 12/01/22   [provider]  REXULTI 1 MG TABS tablet Take 1 mg by mouth every morning. 02/22/23   [provider]  rosuvastatin (CRESTOR) 20 MG tablet TAKE 1 TABLET BY MOUTH EVERY DAY 12/13/22   Margaree Mackintosh, MD      Allergies    Patient has no known allergies.    Review of Systems   Review of Systems  Physical Exam Updated Vital Signs BP 118/79   Pulse 94   Temp 99.5 F (37.5 C)   Resp (!) 22   Ht 6' (1.829 m)   Wt 79.4 kg   SpO2 92%   BMI 23.73 kg/m  Physical Exam Constitutional:  General: He is not in acute distress. HENT:     Head: Normocephalic and atraumatic.  Eyes:     Conjunctiva/sclera: Conjunctivae normal.     Pupils: Pupils are equal, round, and reactive to light.  Cardiovascular:     Rate and Rhythm: Regular rhythm. Tachycardia present.  Pulmonary:     Effort: Pulmonary effort is normal. No respiratory distress.  Abdominal:     General: There is no distension.     Tenderness: There is no abdominal tenderness.  Musculoskeletal:     Comments: Left BKA stump site with some mild overlying erythema of the incision, 4 sutures in place  Skin:    General: Skin is warm and dry.  Neurological:     General: No focal deficit present.     Mental Status: He is  alert. Mental status is at baseline.  Psychiatric:        Mood and Affect: Mood normal.        Behavior: Behavior normal.     ED Results / Procedures / Treatments   Labs (all labs ordered are listed, but only abnormal results are displayed) Labs Reviewed  COMPREHENSIVE METABOLIC PANEL - Abnormal; Notable for the following components:      Result Value   Potassium 3.4 (*)    CO2 21 (*)    Glucose, Bld 127 (*)    BUN 29 (*)    Calcium 10.4 (*)    Total Protein 8.9 (*)    Anion gap 16 (*)    All other components within normal limits  URINALYSIS, ROUTINE W REFLEX MICROSCOPIC - Abnormal; Notable for the following components:   Hgb urine dipstick TRACE (*)    Protein, ur >=300 (*)    All other components within normal limits  CBC WITH DIFFERENTIAL/PLATELET - Abnormal; Notable for the following components:   WBC 12.1 (*)    Neutro Abs 10.2 (*)    Monocytes Absolute 1.1 (*)    All other components within normal limits  URINALYSIS, MICROSCOPIC (REFLEX) - Abnormal; Notable for the following components:   Bacteria, UA RARE (*)    All other components within normal limits  RESP PANEL BY RT-PCR (RSV, FLU A&B, COVID)  RVPGX2  LIPASE, BLOOD  MAGNESIUM    EKG EKG Interpretation Date/Time:  Tuesday May 11 2023 17:13:05 EST Ventricular Rate:  92 PR Interval:    QRS Duration:  95 QT Interval:  378 QTC Calculation: 468 R Axis:   68  Text Interpretation: SInus rhythm Paired ventricular premature complexes Aberrant conduction of SV complex(es) Borderline repolarization abnormality Confirmed by Alvester Chou [phone]) on 05/11/2023 5:14:24 PM  Radiology No results found.  Procedures Procedures    Medications Ordered in ED Medications  ondansetron (ZOFRAN-ODT) disintegrating tablet 4 mg (has no administration in time range)  droperidol (INAPSINE) 2.5 MG/ML injection 1.25 mg (1.25 mg Intravenous Given 05/11/23 1713)  sodium chloride 0.9 % bolus 1,000 mL (0 mLs Intravenous  Stopped 05/11/23 1842)  famotidine (PEPCID) IVPB 20 mg premix (0 mg Intravenous Stopped 05/11/23 1842)  metoCLOPramide (REGLAN) injection 10 mg (10 mg Intravenous Given 05/11/23 2001)  doxycycline (VIBRAMYCIN) 100 mg in sodium chloride 0.9 % 250 mL IVPB (100 mg Intravenous New Bag/Given 05/11/23 2006)    ED Course/ Medical Decision Making/ A&P Clinical Course as of 05/11/23 2231  Tue May 11, 2023  1957 Patient did have some improvement with the droperidol but then subsequently after drinking water he vomited again.  We will try Reglan at this Valdez.  He is requesting his evening doxycycline, does report a history of MRSA -given how sensitive and upset his stomach is, we can try IV doxycycline  [MT]  2211 Patient is now tolerating p.o. and feeling much, much better.  He is wanting to go home and I think this is reasonable at this Valdez.  Again I counseled him with his male partner present about the concerns for marijuana use as a trigger for symptoms, and he reports he will discontinue use of this.  We can put him on Reglan for the next 2 days, he already has Phenergan at home. [MT]    Clinical Course User Index [MT] Dagmawi Venable, Kermit Balo, MD                                 Medical Decision Making Amount and/or Complexity of Data Reviewed Labs: ordered. ECG/medicine tests: ordered.  Risk Prescription drug management.   This patient presents to the ED with concern for nausea and vomiting, abdominal pain. This involves an extensive number of treatment options, and is a complaint that carries with it a high risk of complications and morbidity.  The differential diagnosis includes gastroparesis versus cannabis hyperemesis syndrome versus medication side effect versus other  Co-morbidities that complicate the patient evaluation: History of CHS, recent marijuana use  Additional history obtained from patient's male partner at bedside  External records from outside source obtained and reviewed  including most recent hospitalization course in November, including discharge summary, CT imaging unremarkable  I ordered and personally interpreted labs.  The pertinent results include: Mild leukocytosis, likely reactive, very mild hypokalemia potassium 3.4.  No AKI.   The patient was maintained on a cardiac monitor.  I personally viewed and interpreted the cardiac monitored which showed an underlying rhythm of: Sinus rhythm and sinus tachycardia  Per my interpretation the patient's ECG shows borderline sinus tachycardia, no significantly prolonged QTc  I ordered medication including IV nausea, antiemetics, IV fluids  I have reviewed the patients home medicines and have made adjustments as needed  Test Considered: Low suspicion for acute intra-abdominal process as a cause of the patient's symptoms.  He is status post appendectomy.  He had normal CT imaging performed in November on prior presentation with similar cyclical vomiting, and I do not see an indication for repeat CT imaging  After the interventions noted above, I reevaluated the patient and found that they have: improved  Social Determinants of Health: counseled about marijuana cessation  Do not see clear evidence of a stump site infection -you can continue with his current antibiotic.  He has a follow-up appointment ready scheduled with his operating surgeon, who had made clear to the patient that the stitches need to be left in until the surgeon removes them -- according to the patient's report to me.  Dispostion:  After consideration of the diagnostic results and the patients response to treatment, I feel that the patent would benefit from close outpatient follow-up.         Final Clinical Impression(s) / ED Diagnoses Final diagnoses:  Nausea and vomiting, unspecified vomiting type    Rx / DC Orders ED Discharge Orders          Ordered    metoCLOPramide (REGLAN) 10 MG tablet  Every 8 hours PRN        05/11/23  2230    famotidine (PEPCID) 20 MG tablet  2 times daily  05/11/23 2230              Terald Sleeper, MD 05/11/23 2232

## 2023-05-11 NOTE — ED Notes (Signed)
Pt pain is 8/10. Sent message to provider to see if pt can have some pain meds. Awaiting response.

## 2023-05-11 NOTE — Discharge Instructions (Addendum)
Please discontinue all use of marijuana or THC.  This can be a trigger for cyclical vomiting.  Please stick to a liquid diet tonight and tomorrow.  I can take your medications per normal.  You can use Reglan as needed for the next 1 to 2 days to help with nausea, in addition to your home nausea medications.

## 2023-05-11 NOTE — ED Notes (Signed)
D/c paperwork reviewed with pt, including prescriptions and follow up care.  All questions and/or concerns addressed at time of d/c.  No further needs expressed. . Pt verbalized understanding, Ambulatory with significant other to ED exit, NAD.

## 2023-05-12 ENCOUNTER — Telehealth: Payer: Self-pay

## 2023-05-12 NOTE — Transitions of Care (Post Inpatient/ED Visit) (Unsigned)
   05/12/2023  Name: Benjamin Valdez MRN: [id number] DOB: [birth year]  Today's TOC FU Call Status: Today's TOC FU Call Status:: Unsuccessful Call (1st Attempt) Unsuccessful Call (1st Attempt) Date: 05/12/23  Attempted to reach the patient regarding the most recent Inpatient/ED visit.  Follow Up Plan: Additional outreach attempts will be made to reach the patient to complete the Transitions of Care (Post Inpatient/ED visit) call.   Signature Agnes Lawrence, CMA (AAMA)  [facility] [phone]

## 2023-05-13 ENCOUNTER — Encounter (HOSPITAL_BASED_OUTPATIENT_CLINIC_OR_DEPARTMENT_OTHER): Payer: Self-pay

## 2023-05-13 ENCOUNTER — Other Ambulatory Visit: Payer: Self-pay

## 2023-05-13 ENCOUNTER — Emergency Department (HOSPITAL_BASED_OUTPATIENT_CLINIC_OR_DEPARTMENT_OTHER)
Admission: EM | Admit: 2023-05-13 | Discharge: 2023-05-13 | Disposition: A | Discharge status: Home / Self Care | Payer: Medicare HMO | Attending: Emergency Medicine | Admitting: Emergency Medicine

## 2023-05-13 DIAGNOSIS — E876 Hypokalemia: Secondary | ICD-10-CM | POA: Insufficient documentation

## 2023-05-13 DIAGNOSIS — Z79899 Other long term (current) drug therapy: Secondary | ICD-10-CM | POA: Diagnosis not present

## 2023-05-13 DIAGNOSIS — R109 Unspecified abdominal pain: Secondary | ICD-10-CM | POA: Diagnosis not present

## 2023-05-13 DIAGNOSIS — R112 Nausea with vomiting, unspecified: Secondary | ICD-10-CM | POA: Diagnosis not present

## 2023-05-13 DIAGNOSIS — E860 Dehydration: Secondary | ICD-10-CM | POA: Insufficient documentation

## 2023-05-13 LAB — BASIC METABOLIC PANEL
Anion gap: 14 (ref 5–15)
BUN: 28 mg/dL — ABNORMAL HIGH (ref 8–23)
CO2: 22 mmol/L (ref 22–32)
Calcium: 9.5 mg/dL (ref 8.9–10.3)
Chloride: 102 mmol/L (ref 98–111)
Creatinine, Ser: 1.25 mg/dL — ABNORMAL HIGH (ref 0.61–1.24)
GFR, Estimated: >60 mL/min (ref >60)
Glucose, Bld: 117 mg/dL — ABNORMAL HIGH (ref 70–99)
Potassium: 3.1 mmol/L — ABNORMAL LOW (ref 3.5–5.1)
Sodium: 138 mmol/L (ref 135–145)

## 2023-05-13 MED ORDER — DIPHENHYDRAMINE HCL 50 MG/ML IJ SOLN
25.0000 mg | Freq: Once | INTRAMUSCULAR | Status: AC
  Administered 2023-05-13: 25 mg via INTRAVENOUS
  Filled 2023-05-13: qty 1

## 2023-05-13 MED ORDER — DROPERIDOL 2.5 MG/ML IJ SOLN
0.6250 mg | Freq: Once | INTRAMUSCULAR | Status: AC
  Administered 2023-05-13: 0.625 mg via INTRAVENOUS
  Filled 2023-05-13: qty 2

## 2023-05-13 MED ORDER — KETOROLAC TROMETHAMINE 15 MG/ML IJ SOLN
15.0000 mg | Freq: Once | INTRAMUSCULAR | Status: AC
Start: 2023-05-13 — End: 2023-05-13
  Administered 2023-05-13: 15 mg via INTRAVENOUS
  Filled 2023-05-13: qty 1

## 2023-05-13 MED ORDER — SODIUM CHLORIDE 0.9 % IV BOLUS
500.0000 mL | Freq: Once | INTRAVENOUS | Status: AC
  Administered 2023-05-13: 500 mL via INTRAVENOUS

## 2023-05-13 MED ORDER — POTASSIUM CHLORIDE 10 MEQ/100ML IV SOLN
10.0000 meq | Freq: Once | INTRAVENOUS | Status: AC
  Administered 2023-05-13: 10 meq via INTRAVENOUS
  Filled 2023-05-13: qty 100

## 2023-05-13 MED ORDER — METOCLOPRAMIDE HCL 5 MG/ML IJ SOLN
10.0000 mg | Freq: Once | INTRAMUSCULAR | Status: AC
  Administered 2023-05-13: 10 mg via INTRAVENOUS
  Filled 2023-05-13: qty 2

## 2023-05-13 MED ORDER — MORPHINE SULFATE (PF) 4 MG/ML IV SOLN
4.0000 mg | Freq: Once | INTRAVENOUS | Status: AC
  Administered 2023-05-13: 4 mg via INTRAVENOUS
  Filled 2023-05-13: qty 1

## 2023-05-13 NOTE — ED Triage Notes (Signed)
Pt reports recurrent nausea. Pt reports being seen her two days ago and was better after leaving but the emesis and abdominal pain came back yesterday evening. Pt has been unable to keep nausea meds down.

## 2023-05-13 NOTE — ED Provider Notes (Signed)
7:03 AM Patient signed out to me by previous ED physician. Pt is a 63 yo male presenting for nausea/vomiting. Hx of cannabis hyperemesis syndrome.   Droperidol given. Reglan given.   Physical Exam   BP (!) 169/116 (BP Location: Right Arm)   Pulse 84   Temp 98.6 F (37 C) (Oral)   Resp 18   SpO2 96%   Physical Exam  Procedures  Procedures  ED Course / MDM   Clinical Course as of 05/13/23 0703  Thu May 13, 2023  0449 Patient with history of CHS presents with recurrent nausea vomiting abdominal pain.  Patient had multiple ER evaluations and imaging studies for this previously. [DW]  [phone] Patient reports he has a good response to droperidol.  No evidence of prolonged QT on EKG.  Will give dose of droperidol [DW]  0549 Potassium(!): 3.1 Mild hypokalemia.  Dehydration noted [DW]  1324 Patient appears improved, resting comfortably.  Vital signs have been appropriate He is having continuous telemetry monitoring [DW]  0646 Patient feeling improved, but still receiving IV fluids and potassium. No pain is reported at this time [DW]  [phone] Patient reports he has never been referred to GI.  He will be given information for outpatient follow-up.  Patient has been told previously to stop marijuana use.  Given his history and exam, I have low suspicion for acute abdominal emergency [DW]  0700 Patient now reporting increased nausea, will give Reglan and Benadryl as he has tolerated this previously [DW]  0702 Signed out to Dr. Leanna Sato at shift change to monitor patient's response to meds [DW]    Clinical Course User Index [DW] Zadie Rhine, MD   Medical Decision Making Amount and/or Complexity of Data Reviewed Labs: ordered. Decision-making details documented in ED Course. ECG/medicine tests: ordered.  Risk Prescription drug management.   Epic and improvement of symptoms after repeat droperidol and pain management.  Patient monitored in ED for an additional hour and continued to do  well.  Ready for discharge at this time.  He already has prescriptions for antiemetics at the house.  Patient in no distress and overall condition improved here in the ED. Detailed discussions were had with the patient regarding current findings, and need for close f/u with PCP or on call doctor. The patient has been instructed to return immediately if the symptoms worsen in any way for re-evaluation. Patient verbalized understanding and is in agreement with current care plan. All questions answered prior to discharge.        Edwin Dada P, DO 05/13/23 1108

## 2023-05-13 NOTE — Transitions of Care (Post Inpatient/ED Visit) (Signed)
   05/13/2023  Name: Almanzo Redler MRN: [id number] DOB: 1960/04/26  Today's TOC FU Call Status: Today's TOC FU Call Status:: Unsuccessful Call (1st Attempt) Unsuccessful Call (1st Attempt) Date: 05/12/23  Attempted to reach the patient regarding the most recent Inpatient/ED visit.  Follow Up Plan: No further outreach attempts will be made at this time. We have been unable to contact the patient. Patient is in ER.  Signature Agnes Lawrence, CMA (AAMA)  [facility] [phone]

## 2023-05-13 NOTE — ED Provider Notes (Signed)
[facility] Provider Note   CSN: [id number] Arrival date & time: 05/13/23  0413     History  Chief Complaint  Patient presents with   Emesis   Abdominal Pain    Benjamin Valdez is a 63 y.o. male.  The history is provided by the patient.  Patient with history of cannabis hyperemesis syndrome, left BKA with stump infection presents with recurrent abdominal pain and nausea vomiting.  Patient reports he gets the symptoms frequently.  He is on an oral antibiotic and this may have triggered the vomiting.  No diarrhea.  No fevers.  No chest pain or shortness of breath. Patient admits to smoking marijuana recently.  Recently seen in the ER for similar episode, felt improved after droperidol     Home Medications Prior to Admission medications   Medication Sig Start Date End Date Taking? Authorizing Provider  ALPRAZolam Prudy Feeler) 1 MG tablet Take 1 mg by mouth daily as needed for anxiety. 06/08/22   [provider]  famotidine (PEPCID) 20 MG tablet Take 1 tablet (20 mg total) by mouth 2 (two) times daily. 05/11/23 06/10/23  Terald Sleeper, MD  FLUoxetine (PROZAC) 20 MG capsule Take 20 mg by mouth every morning. TDD 60mg     [provider]  FLUoxetine (PROZAC) 40 MG capsule Take 40 mg by mouth daily. TDD 60mg     [provider]  metoCLOPramide (REGLAN) 10 MG tablet Take 1 tablet (10 mg total) by mouth every 8 (eight) hours as needed for up to 10 doses for refractory nausea / vomiting. 05/11/23   Terald Sleeper, MD  omeprazole (PRILOSEC) 40 MG capsule TAKE 1 CAPSULE BY MOUTH EVERY DAY 09/09/22   Margaree Mackintosh, MD  ondansetron (ZOFRAN) 4 MG tablet Take 1 tablet (4 mg total) by mouth every 4 (four) hours as needed for nausea or vomiting. 03/16/23   Rai, Delene Ruffini, MD  promethazine (PHENERGAN) 25 MG suppository Place 1 suppository (25 mg total) rectally every 6 (six) hours as needed for nausea or vomiting. 03/13/23   Rai,  Delene Ruffini, MD  promethazine (PHENERGAN) 25 MG tablet Take 1 tablet (25 mg total) by mouth every 6 (six) hours as needed for nausea or vomiting. 03/13/23   Rai, Ripudeep Kirtland Bouchard, MD  QUEtiapine (SEROQUEL) 50 MG tablet Take 150 mg by mouth at bedtime. 12/01/22   [provider]  REXULTI 1 MG TABS tablet Take 1 mg by mouth every morning. 02/22/23   [provider]  rosuvastatin (CRESTOR) 20 MG tablet TAKE 1 TABLET BY MOUTH EVERY DAY 12/13/22   Margaree Mackintosh, MD      Allergies    Patient has no known allergies.    Review of Systems   Review of Systems  Constitutional:  Negative for fever.    Physical Exam Updated Vital Signs BP (!) 169/116 (BP Location: Right Arm)   Pulse 84   Temp 98.6 F (37 C) (Oral)   Resp 18   SpO2 96%  Physical Exam CONSTITUTIONAL: Anxious, rocking back and forth in the bed HEAD: Normocephalic/atraumatic EYES: EOMI ENMT: Mucous membranes moist NECK: supple no meningeal signs CV: S1/S2 noted, no murmurs/rubs/gallops noted LUNGS: Lungs are clear to auscultation bilaterally, no apparent distress ABDOMEN: soft, diffuse mild tenderness, no rebound or guarding, bowel sounds noted throughout abdomen NEURO: Pt is awake/alert/appropriate, moves all extremitiesx4.  No facial droop.   EXTREMITIES: Left BKA stump noted, mild edema, no discharge SKIN: warm, color normal PSYCH: Anxious  ED Results / Procedures / Treatments   Labs (all labs ordered are listed, but only abnormal results are displayed) Labs Reviewed  BASIC METABOLIC PANEL - Abnormal; Notable for the following components:      Result Value   Potassium 3.1 (*)    Glucose, Bld 117 (*)    BUN 28 (*)    Creatinine, Ser 1.25 (*)    All other components within normal limits    EKG EKG Interpretation Date/Time:  Thursday May 13 2023 05:10:54 EST Ventricular Rate:  66 PR Interval:  127 QRS Duration:  95 QT Interval:  430 QTC Calculation: 451 R Axis:   75  Text Interpretation: Sinus  rhythm Interpretation limited secondary to artifact Confirmed by Zadie Rhine ([id number]) on 05/13/2023 5:13:23 AM  Radiology No results found.  Procedures Procedures    Medications Ordered in ED Medications  metoCLOPramide (REGLAN) injection 10 mg (has no administration in time range)  diphenhydrAMINE (BENADRYL) injection 25 mg (has no administration in time range)  sodium chloride 0.9 % bolus 500 mL (0 mLs Intravenous Stopped 05/13/23 0532)  droperidol (INAPSINE) 2.5 MG/ML injection 0.625 mg (0.625 mg Intravenous Given 05/13/23 0528)  potassium chloride 10 mEq in 100 mL IVPB (0 mEq Intravenous Stopped 05/13/23 0654)    ED Course/ Medical Decision Making/ A&P Clinical Course as of 05/13/23 0703  Thu May 13, 2023  0449 Patient with history of CHS presents with recurrent nausea vomiting abdominal pain.  Patient had multiple ER evaluations and imaging studies for this previously. [DW]  [phone] Patient reports he has a good response to droperidol.  No evidence of prolonged QT on EKG.  Will give dose of droperidol [DW]  0549 Potassium(!): 3.1 Mild hypokalemia.  Dehydration noted [DW]  4259 Patient appears improved, resting comfortably.  Vital signs have been appropriate He is having continuous telemetry monitoring [DW]  0646 Patient feeling improved, but still receiving IV fluids and potassium. No pain is reported at this time [DW]  [phone] Patient reports he has never been referred to GI.  He will be given information for outpatient follow-up.  Patient has been told previously to stop marijuana use.  Given his history and exam, I have low suspicion for acute abdominal emergency [DW]  0700 Patient now reporting increased nausea, will give Reglan and Benadryl as he has tolerated this previously [DW]  0702 Signed out to Dr. Leanna Sato at shift change to monitor patient's response to meds [DW]    Clinical Course User Index [DW] Zadie Rhine, MD                                 Medical Decision  Making Amount and/or Complexity of Data Reviewed Labs: ordered. Decision-making details documented in ED Course. ECG/medicine tests: ordered.  Risk Prescription drug management.   This patient presents to the ED for concern of abdominal pain and vomiting, this involves an extensive number of treatment options, and is a complaint that carries with it a high risk of complications and morbidity.  The differential diagnosis includes but is not limited to cholecystitis, cholelithiasis, pancreatitis, gastritis, peptic ulcer disease, appendicitis, bowel obstruction, bowel perforation, diverticulitis, AAA, ischemic bowel, acute coronary syndrome, cyclic vomiting syndrome, CHS   Comorbidities that complicate the patient evaluation: Patient's presentation is complicated by their history of recent BKA with infection, diabetes  Social Determinants of Health: Patient's  marijuana use   increases the complexity of managing their presentation  Additional history obtained: Additional history obtained from significant other Records reviewed previous admission documents  Lab Tests: I Ordered, and personally interpreted labs.  The pertinent results include:  hypokalemia   Cardiac Monitoring: The patient was maintained on a cardiac monitor.  I personally viewed and interpreted the cardiac monitor which showed an underlying rhythm of:  sinus rhythm  Medicines ordered and prescription drug management: I ordered medication including droperidol for nausea and vomiting Reevaluation of the patient after these medicines showed that the patient    improved   Reevaluation: After the interventions noted above, I reevaluated the patient and found that they have :improved  Complexity of problems addressed: Patient's presentation is most consistent with  exacerbation of chronic illness          Final Clinical Impression(s) / ED Diagnoses Final diagnoses:  Dehydration  Nausea and vomiting,  unspecified vomiting type    Rx / DC Orders ED Discharge Orders     None         Zadie Rhine, MD 05/13/23 [phone]

## 2023-05-14 ENCOUNTER — Emergency Department (HOSPITAL_BASED_OUTPATIENT_CLINIC_OR_DEPARTMENT_OTHER)
Admission: EM | Admit: 2023-05-14 | Discharge: 2023-05-15 | Disposition: A | Discharge status: Home / Self Care | Payer: Medicare HMO | Attending: Emergency Medicine | Admitting: Emergency Medicine

## 2023-05-14 ENCOUNTER — Encounter (HOSPITAL_BASED_OUTPATIENT_CLINIC_OR_DEPARTMENT_OTHER): Payer: Self-pay | Admitting: Urology

## 2023-05-14 ENCOUNTER — Other Ambulatory Visit: Payer: Self-pay

## 2023-05-14 DIAGNOSIS — R112 Nausea with vomiting, unspecified: Secondary | ICD-10-CM | POA: Diagnosis not present

## 2023-05-14 DIAGNOSIS — E860 Dehydration: Secondary | ICD-10-CM | POA: Insufficient documentation

## 2023-05-14 DIAGNOSIS — N179 Acute kidney failure, unspecified: Secondary | ICD-10-CM | POA: Diagnosis not present

## 2023-05-14 LAB — CBC WITH DIFFERENTIAL/PLATELET
Abs Immature Granulocytes: 0.03 10*3/uL (ref 0.00–0.07)
Basophils Absolute: 0 10*3/uL (ref 0.0–0.1)
Basophils Relative: 0 %
Eosinophils Absolute: 0 10*3/uL (ref 0.0–0.5)
Eosinophils Relative: 0 %
HCT: 42.5 % (ref 39.0–52.0)
Hemoglobin: 14.3 g/dL (ref 13.0–17.0)
Immature Granulocytes: 0 %
Lymphocytes Relative: 7 %
Lymphs Abs: 0.5 10*3/uL — ABNORMAL LOW (ref 0.7–4.0)
MCH: 29.2 pg (ref 26.0–34.0)
MCHC: 33.6 g/dL (ref 30.0–36.0)
MCV: 86.7 fL (ref 80.0–100.0)
Monocytes Absolute: 0.3 10*3/uL (ref 0.1–1.0)
Monocytes Relative: 3 %
Neutro Abs: 6.7 10*3/uL (ref 1.7–7.7)
Neutrophils Relative %: 90 %
Platelets: 284 10*3/uL (ref 150–400)
RBC: 4.9 MIL/uL (ref 4.22–5.81)
RDW: 12.5 % (ref 11.5–15.5)
WBC: 7.5 10*3/uL (ref 4.0–10.5)
nRBC: 0 % (ref 0.0–0.2)

## 2023-05-14 LAB — COMPREHENSIVE METABOLIC PANEL
ALT: 22 U/L (ref 0–44)
AST: 27 U/L (ref 15–41)
Albumin: 4.4 g/dL (ref 3.5–5.0)
Alkaline Phosphatase: 74 U/L (ref 38–126)
Anion gap: 13 (ref 5–15)
BUN: 27 mg/dL — ABNORMAL HIGH (ref 8–23)
CO2: 21 mmol/L — ABNORMAL LOW (ref 22–32)
Calcium: 9.1 mg/dL (ref 8.9–10.3)
Chloride: 103 mmol/L (ref 98–111)
Creatinine, Ser: 1.28 mg/dL — ABNORMAL HIGH (ref 0.61–1.24)
GFR, Estimated: >60 mL/min (ref >60)
Glucose, Bld: 116 mg/dL — ABNORMAL HIGH (ref 70–99)
Potassium: 3.7 mmol/L (ref 3.5–5.1)
Sodium: 137 mmol/L (ref 135–145)
Total Bilirubin: 0.9 mg/dL (ref 0.0–1.2)
Total Protein: 7.8 g/dL (ref 6.5–8.1)

## 2023-05-14 LAB — MAGNESIUM: Magnesium: 2 mg/dL (ref 1.7–2.4)

## 2023-05-14 LAB — URINALYSIS, ROUTINE W REFLEX MICROSCOPIC
Bilirubin Urine: NEGATIVE
Glucose, UA: NEGATIVE mg/dL
Hgb urine dipstick: NEGATIVE
Ketones, ur: 15 mg/dL — AB
Leukocytes,Ua: NEGATIVE
Nitrite: NEGATIVE
Protein, ur: NEGATIVE mg/dL
Specific Gravity, Urine: >=1.03 (ref 1.005–1.030)
pH: 6 (ref 5.0–8.0)

## 2023-05-14 MED ORDER — SODIUM CHLORIDE 0.9 % IV BOLUS
1000.0000 mL | Freq: Once | INTRAVENOUS | Status: AC
  Administered 2023-05-14: 1000 mL via INTRAVENOUS

## 2023-05-14 MED ORDER — PROMETHAZINE HCL 25 MG/ML IJ SOLN
INTRAMUSCULAR | Status: AC
  Filled 2023-05-14: qty 1

## 2023-05-14 MED ORDER — SODIUM CHLORIDE 0.9 % IV SOLN
25.0000 mg | Freq: Once | INTRAVENOUS | Status: AC
  Administered 2023-05-14: 25 mg via INTRAVENOUS
  Filled 2023-05-14: qty 1

## 2023-05-14 MED ORDER — DROPERIDOL 2.5 MG/ML IJ SOLN
0.6250 mg | Freq: Once | INTRAMUSCULAR | Status: AC
  Administered 2023-05-14: 0.625 mg via INTRAVENOUS
  Filled 2023-05-14: qty 2

## 2023-05-14 MED ORDER — LORAZEPAM 2 MG/ML IJ SOLN
0.5000 mg | Freq: Once | INTRAMUSCULAR | Status: AC
  Administered 2023-05-14: 0.5 mg via INTRAVENOUS
  Filled 2023-05-14: qty 1

## 2023-05-14 MED ORDER — ONDANSETRON HCL 4 MG/2ML IJ SOLN
4.0000 mg | Freq: Once | INTRAMUSCULAR | Status: AC
  Administered 2023-05-14: 4 mg via INTRAVENOUS
  Filled 2023-05-14: qty 2

## 2023-05-14 NOTE — ED Notes (Signed)
Pt was shivering and causing artifact on the monitor. Pt given warm blankets and monitor was adjusted for arrhythmia.

## 2023-05-14 NOTE — ED Triage Notes (Signed)
Continued nausea since yesterday, states was feeling better after leaving here yesterday but started vomiting again this am  Took phenergan supp at 1300 with no relief  Also taking Reglan and Pepcid with no relief

## 2023-05-14 NOTE — ED Provider Notes (Signed)
[facility] Provider Note   CSN: [id number] Arrival date & time: 05/14/23  1623     History  Chief Complaint  Patient presents with   Emesis    Benjamin Valdez is a 63 y.o. male.  Patient presents to the emergency department for evaluation of nausea and vomiting.  Patient has a history of cannabinoid hyperemesis syndrome (last use 5 days ago).  Last admitted with normal CT November 2024.  2 previous visits over the past 72 hours.  Patient reports ongoing "retching" at home.  He has been unable to take the doxycycline that is prescribed for left BKA infection, although he thinks that this is stable.  No fevers.  He has some upper abdominal pain, but less than what he typically experiences.  No diarrhea.  He has tried Phenergan suppositories, Reglan, Pepcid at home without improvement.  He states that he vomited after taking the oral medications.       Home Medications Prior to Admission medications   Medication Sig Start Date End Date Taking? Authorizing Provider  ALPRAZolam Prudy Feeler) 1 MG tablet Take 1 mg by mouth daily as needed for anxiety. 06/08/22   [provider]  famotidine (PEPCID) 20 MG tablet Take 1 tablet (20 mg total) by mouth 2 (two) times daily. 05/11/23 06/10/23  Terald Sleeper, MD  FLUoxetine (PROZAC) 20 MG capsule Take 20 mg by mouth every morning. TDD 60mg     [provider]  FLUoxetine (PROZAC) 40 MG capsule Take 40 mg by mouth daily. TDD 60mg     [provider]  metoCLOPramide (REGLAN) 10 MG tablet Take 1 tablet (10 mg total) by mouth every 8 (eight) hours as needed for up to 10 doses for refractory nausea / vomiting. 05/11/23   Terald Sleeper, MD  omeprazole (PRILOSEC) 40 MG capsule TAKE 1 CAPSULE BY MOUTH EVERY DAY 09/09/22   Margaree Mackintosh, MD  ondansetron (ZOFRAN) 4 MG tablet Take 1 tablet (4 mg total) by mouth every 4 (four) hours as needed for nausea or vomiting. 03/16/23   Rai,  Delene Ruffini, MD  promethazine (PHENERGAN) 25 MG suppository Place 1 suppository (25 mg total) rectally every 6 (six) hours as needed for nausea or vomiting. 03/13/23   Rai, Delene Ruffini, MD  promethazine (PHENERGAN) 25 MG tablet Take 1 tablet (25 mg total) by mouth every 6 (six) hours as needed for nausea or vomiting. 03/13/23   Rai, Ripudeep Kirtland Bouchard, MD  QUEtiapine (SEROQUEL) 50 MG tablet Take 150 mg by mouth at bedtime. 12/01/22   [provider]  REXULTI 1 MG TABS tablet Take 1 mg by mouth every morning. 02/22/23   [provider]  rosuvastatin (CRESTOR) 20 MG tablet TAKE 1 TABLET BY MOUTH EVERY DAY 12/13/22   Margaree Mackintosh, MD      Allergies    Patient has no known allergies.    Review of Systems   Review of Systems  Physical Exam Updated Vital Signs BP (!) 175/114   Pulse 75   Temp 97.7 F (36.5 C) (Oral)   Resp 16   Ht 6' (1.829 m)   Wt 79.3 kg   SpO2 96%   BMI 23.71 kg/m  Physical Exam Vitals and nursing note reviewed.  Constitutional:      General: He is not in acute distress.    Appearance: He is well-developed.  HENT:     Head: Normocephalic and atraumatic.  Eyes:     General:  Right eye: No discharge.        Left eye: No discharge.     Conjunctiva/sclera: Conjunctivae normal.  Cardiovascular:     Rate and Rhythm: Normal rate and regular rhythm.     Heart sounds: Normal heart sounds.  Pulmonary:     Effort: Pulmonary effort is normal.     Breath sounds: Normal breath sounds.  Abdominal:     Palpations: Abdomen is soft.     Tenderness: There is abdominal tenderness in the epigastric area and left upper quadrant. There is no guarding or rebound.  Musculoskeletal:     Cervical back: Normal range of motion and neck supple.     Comments: LLE BKA, dressed  Skin:    General: Skin is warm and dry.  Neurological:     Mental Status: He is alert.     ED Results / Procedures / Treatments   Labs (all labs ordered are listed, but only abnormal  results are displayed) Labs Reviewed  CBC WITH DIFFERENTIAL/PLATELET - Abnormal; Notable for the following components:      Result Value   Lymphs Abs 0.5 (*)    All other components within normal limits  COMPREHENSIVE METABOLIC PANEL - Abnormal; Notable for the following components:   CO2 21 (*)    Glucose, Bld 116 (*)    BUN 27 (*)    Creatinine, Ser 1.28 (*)    All other components within normal limits  MAGNESIUM  URINALYSIS, ROUTINE W REFLEX MICROSCOPIC    ED ECG REPORT   Date: 05/14/2023  Rate: 67  Rhythm: normal sinus rhythm  QRS Axis: normal  Intervals: normal  ST/T Wave abnormalities: normal  Conduction Disutrbances:none  Narrative Interpretation: qt normal  Old EKG Reviewed: unchanged except qt shorter today  I have personally reviewed the EKG tracing and agree with the computerized printout as noted.   Radiology No results found.  Procedures Procedures    Medications Ordered in ED Medications  promethazine (PHENERGAN) 25 MG/ML injection (  See Procedure Record 05/14/23 1931)  sodium chloride 0.9 % bolus 1,000 mL (0 mLs Intravenous Stopped 05/14/23 1952)  droperidol (INAPSINE) 2.5 MG/ML injection 0.625 mg (0.625 mg Intravenous Given 05/14/23 1820)  promethazine (PHENERGAN) 25 mg in sodium chloride 0.9 % 50 mL IVPB (0 mg Intravenous Stopped 05/14/23 1941)  sodium chloride 0.9 % bolus 1,000 mL (1,000 mLs Intravenous New Bag/Given 05/14/23 2033)  LORazepam (ATIVAN) injection 0.5 mg (0.5 mg Intravenous Given 05/14/23 2047)  ondansetron (ZOFRAN) injection 4 mg (4 mg Intravenous Given 05/14/23 2048)    ED Course/ Medical Decision Making/ A&P    Patient seen and examined. History obtained directly from patient.   Labs/EKG: Ordered CBC, CMP, magnesium, UA, EKG.  Imaging: None ordered  Medications/Fluids: Ordered: IV fluid bolus, IV droperidol.   Most recent vital signs reviewed and are as follows: BP (!) 175/114   Pulse 75   Temp 97.7 F (36.5 C) (Oral)    Resp 16   Ht 6' (1.829 m)   Wt 79.3 kg   SpO2 96%   BMI 23.71 kg/m   Initial impression: Intractable nausea and vomiting  7:26 PM Reassessment performed. Patient appears stable.  States that nausea has not yet improved.  Labs personally reviewed and interpreted including: CBC unremarkable other than lymphopenia noted; CMP with mildly elevated creatinine and BUN likely due in part due to dehydration, normal electrolytes; magnesium normal.  Reviewed pertinent lab work and imaging with patient at bedside. Questions answered.   Most current  vital signs reviewed and are as follows: BP (!) 175/114   Pulse 75   Temp 97.7 F (36.5 C) (Oral)   Resp 16   Ht 6' (1.829 m)   Wt 79.3 kg   SpO2 96%   BMI 23.71 kg/m   Plan: Ordered IV promethazine  9:23 PM Reassessment performed. Patient appears stable, but uncomfortable.  He is on second liter of IV fluids.  He has also received additional antiemetics including Zofran and Ativan.  Reviewed pertinent lab work and imaging with patient at bedside. Questions answered.   Most current vital signs reviewed and are as follows: BP (!) 168/114   Pulse 100   Temp 97.7 F (36.5 C) (Oral)   Resp 19   Ht 6' (1.829 m)   Wt 79.3 kg   SpO2 95%   BMI 23.71 kg/m   Plan: Admit to hospital.   Patient discussed with Dr. Andria Meuse.  I have discussed patient with [facility] hospitalist, Dr. Nestor Lewandowsky who will admit.                                  Medical Decision Making Amount and/or Complexity of Data Reviewed Labs: ordered.  Risk Prescription drug management. Decision regarding hospitalization.   Patient with intractable nausea and vomiting, history of the same.  Symptoms remain poorly controlled despite administration of 4 antiemetics here.  This is his third visit in 72 hours.  He has mild AKI which is progressing.  Abdominal exam is reassuring.  Plan for admission.        Final Clinical Impression(s) / ED Diagnoses Final diagnoses:   Intractable nausea and vomiting  Acute kidney injury [facility])  Dehydration    Rx / DC Orders ED Discharge Orders     None         Renne Crigler, Cordelia Poche 05/14/23 2125    Anders Simmonds T, DO 05/14/23 2249

## 2023-05-14 NOTE — ED Notes (Signed)
Nausea unchanged.

## 2023-05-14 NOTE — ED Notes (Signed)
Pt asked me to reach out to EDP regarding admission. Pt advised he denied it last night when the EDP offered it to him. He is interested in admission at this point, EDP notified. Pt also advised he's 5x days sober from daily marijuana use. Prior to that he was smoking almost every day for a year. Educated patient about cyclic vomiting syndrome risks and asked provider to educate.

## 2023-05-15 DIAGNOSIS — N179 Acute kidney failure, unspecified: Secondary | ICD-10-CM | POA: Diagnosis not present

## 2023-05-15 MED ORDER — SODIUM CHLORIDE 0.9 % IV SOLN: INTRAVENOUS | Status: DC

## 2023-05-15 NOTE — ED Notes (Addendum)
Pt alert and oriented X 4 at the time of discharge. RR even and unlabored. No acute distress noted. Pt verbalized understanding of discharge instructions as discussed. Pt transport to lobby at time of discharge to await the arrival of family members.

## 2023-05-15 NOTE — ED Notes (Signed)
Went over discharge instructions with patient.

## 2023-05-15 NOTE — ED Notes (Signed)
Pt was able to drink all the ginger ale and several packs of crackers, no nausea.  Additional italian ice pop given.  Chicken noodle soup given.  Pt is feeling better.

## 2023-05-15 NOTE — Discharge Instructions (Addendum)
It was a pleasure caring for you today in the emergency department.  Recommend resuming Carafate.  Eat a bland diet.  Follow-up with your PCP  Please return to the emergency department for any worsening or worrisome symptoms.

## 2023-05-15 NOTE — Progress Notes (Signed)
Hospitalist Transfer Note:    Nursing staff, Please call [facility] Admits & Consults System-Wide number on Amion [phone]) as soon as patient's arrival, so appropriate admitting provider can evaluate the pt.   Transferring facility: [facility] Requesting provider: Renne Crigler, PA (EDP at [facility]) Reason for transfer: admission for further evaluation and management of intractable nausea/vomiting.    63 year old male with history of hyperemesis cannabinoid syndrome,  who presented to [facility] ED complaining of 4 days of recurrent nausea resulting in episodes of nonbloody, nonbilious emesis over that timeframe.  He also reports associated mild epigastric discomfort, in the absence of any subjective fever, chills, diarrhea, melena, or hematochezia.  He has a history of prior intractable nausea/vomiting in the context of his recurrent marijuana use and was hospitalized for similar in November 2024.  His presentation to the [facility] this evening represents his third such presentation to the emergency department over the last 4 days, with similar complaints.  On the preceding 2 visits to [facility] ED, he was discharged to home.  However, this evening, he continues to experience persistent nausea and inability to tolerate p.o. and spite of doses of droperidol, Ativan, Zofran, Phenergan.   He notes that his mild epigastric discomfort is very similar to that which she is experienced at times of previous presentations for intractable nausea/vomiting.  During the November 2024 hospitalization, he underwent CT abdomen/pelvis, which showed no evidence of acute intra-abdominal or intrapelvic process.  Medically, EDP did not pursue updated abdominal imaging this evening.  Subsequently, I accepted this patient for transfer for observation to a MedSurg bed at [facility] or [facility] (first available) for further work-up and management of the above.      Newton Pigg, DO Hospitalist

## 2023-05-15 NOTE — ED Notes (Signed)
Pt given [place] ice push pop and ice water.  Pt appears to be feeling better.  Delay explained to pt

## 2023-05-15 NOTE — ED Notes (Signed)
Pt tolerated the second [place] ice and the chicken noodle soup and saltines.  Feels well.  Resting.  Has voided x3

## 2023-05-15 NOTE — ED Provider Notes (Signed)
6:30 AM  Patient has been boarding overnight, plan to admit for intractable nausea and vomiting, history of CHS.  Labs reviewed, these are stable.  He received antiemetics.  He was admitted.  Reassessed patient this morning, he is feeling much better.  He has not had any emesis in approximately 4 to 5 hours.  He is tolerant p.o. without difficulty.  He is not having abdominal pain.  He is requesting discharge.  Given patient's symptoms have resolved, he is feeling much better, tolerating p.o, discharge is reasonable plan at this point.  Encouraged him to avoid [facility], he has antiemetics at home, encouraged bland diet, encouraged adherence to her medication regimen.  Follow-up with PCP  The patient improved significantly and was discharged in stable condition. Detailed discussions were had with the patient/guardian regarding current findings, and need for close f/u with PCP or on call doctor. The patient/guardian has been instructed to return immediately if the symptoms worsen in any way for re-evaluation. Patient/guardian verbalized understanding and is in agreement with current care plan. All questions answered prior to discharge.     Tanda Rockers A, DO 05/15/23 0630

## 2023-05-15 NOTE — ED Notes (Signed)
Pt requesting Ginger Ale, OK per RN Morrie Sheldon pt given the same

## 2023-05-15 NOTE — ED Notes (Signed)
Pt was able to tolerate all the [place] ice.  No nausea at this time.  Saltine crackers and ginger ale given

## 2023-05-17 DIAGNOSIS — F411 Generalized anxiety disorder: Secondary | ICD-10-CM | POA: Diagnosis not present

## 2023-05-17 DIAGNOSIS — F331 Major depressive disorder, recurrent, moderate: Secondary | ICD-10-CM | POA: Diagnosis not present

## 2023-05-20 ENCOUNTER — Telehealth: Payer: Self-pay | Admitting: Internal Medicine

## 2023-05-20 DIAGNOSIS — F411 Generalized anxiety disorder: Secondary | ICD-10-CM | POA: Diagnosis not present

## 2023-05-20 DIAGNOSIS — F331 Major depressive disorder, recurrent, moderate: Secondary | ICD-10-CM | POA: Diagnosis not present

## 2023-05-20 NOTE — Telephone Encounter (Signed)
LVM to CB and schedule hospital follow up, since he had been to emergency room a couple of times and admitted last time.

## 2023-05-26 ENCOUNTER — Ambulatory Visit: Payer: Medicare HMO | Admitting: Neurology

## 2023-05-26 ENCOUNTER — Encounter: Payer: Self-pay | Admitting: Neurology

## 2023-05-26 ENCOUNTER — Ambulatory Visit (INDEPENDENT_AMBULATORY_CARE_PROVIDER_SITE_OTHER): Payer: Medicare HMO | Admitting: Neurology

## 2023-05-26 DIAGNOSIS — G5603 Carpal tunnel syndrome, bilateral upper limbs: Secondary | ICD-10-CM

## 2023-05-26 DIAGNOSIS — R200 Anesthesia of skin: Secondary | ICD-10-CM | POA: Diagnosis not present

## 2023-05-26 DIAGNOSIS — R208 Other disturbances of skin sensation: Secondary | ICD-10-CM

## 2023-05-26 DIAGNOSIS — Z9989 Dependence on other enabling machines and devices: Secondary | ICD-10-CM

## 2023-05-26 DIAGNOSIS — R202 Paresthesia of skin: Secondary | ICD-10-CM | POA: Diagnosis not present

## 2023-05-26 DIAGNOSIS — G5630 Lesion of radial nerve, unspecified upper limb: Secondary | ICD-10-CM

## 2023-05-26 NOTE — Procedures (Signed)
 Full Name: Benjamin Valdez Gender: Male MRN #: [id number] Date of Birth: 07-02-1960    Visit Date: 05/26/2023 10:03 Age: 63 Years Examining Physician: Dr. [name] Referring Physician: Dr. Charlie Crete Height: 6 feet 0 inch History: 63 year old male complains couple years history of intermittent bilateral hands paresthesia, especially since he depend on his crutches more in 2024, mainly involving first 3 fingers, with frequent nocturnal symptoms  Summary of the test:  Nerve conduction study:  Bilateral median sensory showed mildly prolonged peak latency with well-preserved snap amplitude.  Bilateral ulnar and radial sensory responses were normal.  Bilateral ulnar and median motor responses were normal.  Electromyography: Selected needle examinations of bilateral upper extremity muscles, right cervical paraspinal muscles were performed.  Needle examination of bilateral abductor pollicis brevis showed no evidence of active denervation, normal morphology motor unit potential, with slightly decreased recruitment patterns.  Conclusion: This is an abnormal study.  There is electrodiagnostic evidence of mild bilateral median neuropathy across the wrist, consistent with mild bilateral carpal tunnel syndromes.    [name]. M.D. Ph.D.   [facility] [address] [place], [place] [address] Tel: [phone] Fax: [phone]  Verbal informed consent was obtained from the patient, patient was informed of potential risk of procedure, including bruising, bleeding, hematoma formation, infection, muscle weakness, muscle pain, numbness, among others.        MNC    Nerve / Sites Muscle Latency Ref. Amplitude Ref. Rel Amp Segments Distance Velocity Ref. Area    ms ms mV mV %  cm m/s m/s mVms  L Median - APB     Wrist APB 4.3 <=4.4 7.6 >=4.0 100 Wrist - APB 7   31.8     Upper arm APB 9.1  7.3  95.7 Upper arm - Wrist 27.6 57 >=49 31.3  R Median - APB      Wrist APB 3.8 <=4.4 8.1 >=4.0 100 Wrist - APB 7   29.3     Upper arm APB 8.4  6.8  84.3 Upper arm - Wrist 26.6 57 >=49 26.4  L Ulnar - ADM     Wrist ADM 3.1 <=3.3 6.7 >=6.0 100 Wrist - ADM 7   19.9     B.Elbow ADM 4.9  6.5  97.2 B.Elbow - Wrist 11.6 66 >=49 19.8     A.Elbow ADM 8.5  5.9  90.1 A.Elbow - B.Elbow 21 58 >=49 19.2  R Ulnar - ADM     Wrist ADM 2.8 <=3.3 10.0 >=6.0 100 Wrist - ADM 7   30.6     B.Elbow ADM 4.6  10.1  101 B.Elbow - Wrist 12 68 >=49 31.1     A.Elbow ADM 8.1  9.3  92.4 A.Elbow - B.Elbow 21 59 >=49 30.3             SNC    Nerve / Sites Rec. Site Peak Lat Ref.  Amp Ref. Segments Distance    ms ms V V  cm  L Radial - Anatomical snuff box (Forearm)     Forearm Wrist 2.5 <=2.9 15 >=15 Forearm - Wrist 10  R Radial - Anatomical snuff box (Forearm)     Forearm Wrist 2.4 <=2.9 22 >=15 Forearm - Wrist 10  L Median - Orthodromic (Dig II, Mid palm)     Dig II Wrist 4.0 <=3.4 10 >=10 Dig II - Wrist 13  R Median - Orthodromic (Dig II, Mid palm)  Dig II Wrist 3.7 <=3.4 10 >=10 Dig II - Wrist 13  L Ulnar - Orthodromic, (Dig V, Mid palm)     Dig V Wrist 3.2 <=3.1 6 >=5 Dig V - Wrist 11  R Ulnar - Orthodromic, (Dig V, Mid palm)     Dig V Wrist 3.0 <=3.1 8 >=5 Dig V - Wrist 61                 F  Wave    Nerve F Lat Ref.   ms ms  L Ulnar - ADM 31.6 <=32.0  R Ulnar - ADM 31.2 <=32.0         EMG Summary Table    Spontaneous MUAP Recruitment  Muscle IA Fib PSW Fasc Other Amp Dur. Poly Pattern  R. Abductor pollicis brevis Normal None None None _______ Normal Normal Normal Reduced  R. Pronator teres Normal None None None _______ Normal Normal Normal Normal  R. First dorsal interosseous Normal None None None _______ Normal Normal Normal Normal  R. Biceps brachii Normal None None None _______ Normal Normal Normal Normal  R. Deltoid Normal None None None _______ Normal Normal Normal Normal  R. Extensor digitorum communis Normal None None None _______ Normal Normal  Normal Normal  R. Cervical paraspinals Normal None None None _______ Normal Normal Normal Normal  L. Abductor pollicis brevis Normal None None None _______ Normal Normal Normal Reduced

## 2023-05-26 NOTE — Progress Notes (Signed)
 EMG nerve conduction study report is under procedure tab

## 2023-06-14 ENCOUNTER — Other Ambulatory Visit: Payer: Medicare HMO

## 2023-06-15 DIAGNOSIS — F411 Generalized anxiety disorder: Secondary | ICD-10-CM | POA: Diagnosis not present

## 2023-06-15 DIAGNOSIS — F331 Major depressive disorder, recurrent, moderate: Secondary | ICD-10-CM | POA: Diagnosis not present

## 2023-06-16 DIAGNOSIS — F331 Major depressive disorder, recurrent, moderate: Secondary | ICD-10-CM | POA: Diagnosis not present

## 2023-06-16 DIAGNOSIS — F411 Generalized anxiety disorder: Secondary | ICD-10-CM | POA: Diagnosis not present

## 2023-06-21 ENCOUNTER — Encounter: Payer: Medicare HMO | Admitting: Internal Medicine

## 2023-06-29 ENCOUNTER — Ambulatory Visit: Payer: Medicare HMO | Admitting: Internal Medicine

## 2023-07-05 DIAGNOSIS — D229 Melanocytic nevi, unspecified: Secondary | ICD-10-CM | POA: Diagnosis not present

## 2023-07-05 DIAGNOSIS — Z85820 Personal history of malignant melanoma of skin: Secondary | ICD-10-CM | POA: Diagnosis not present

## 2023-07-05 DIAGNOSIS — L814 Other melanin hyperpigmentation: Secondary | ICD-10-CM | POA: Diagnosis not present

## 2023-07-05 DIAGNOSIS — L821 Other seborrheic keratosis: Secondary | ICD-10-CM | POA: Diagnosis not present

## 2023-07-05 DIAGNOSIS — Z86018 Personal history of other benign neoplasm: Secondary | ICD-10-CM | POA: Diagnosis not present

## 2023-07-07 DIAGNOSIS — S88119D Complete traumatic amputation at level between knee and ankle, unspecified lower leg, subsequent encounter: Secondary | ICD-10-CM | POA: Diagnosis not present

## 2023-07-14 DIAGNOSIS — F331 Major depressive disorder, recurrent, moderate: Secondary | ICD-10-CM | POA: Diagnosis not present

## 2023-07-14 DIAGNOSIS — F411 Generalized anxiety disorder: Secondary | ICD-10-CM | POA: Diagnosis not present

## 2023-07-15 DIAGNOSIS — F411 Generalized anxiety disorder: Secondary | ICD-10-CM | POA: Diagnosis not present

## 2023-07-15 DIAGNOSIS — F331 Major depressive disorder, recurrent, moderate: Secondary | ICD-10-CM | POA: Diagnosis not present

## 2023-08-07 ENCOUNTER — Emergency Department (HOSPITAL_BASED_OUTPATIENT_CLINIC_OR_DEPARTMENT_OTHER)
Admission: EM | Admit: 2023-08-07 | Discharge: 2023-08-08 | Disposition: A | Discharge status: Home / Self Care | Attending: Emergency Medicine | Admitting: Emergency Medicine

## 2023-08-07 ENCOUNTER — Encounter (HOSPITAL_BASED_OUTPATIENT_CLINIC_OR_DEPARTMENT_OTHER): Payer: Self-pay | Admitting: Emergency Medicine

## 2023-08-07 ENCOUNTER — Other Ambulatory Visit: Payer: Self-pay

## 2023-08-07 ENCOUNTER — Emergency Department (HOSPITAL_BASED_OUTPATIENT_CLINIC_OR_DEPARTMENT_OTHER)

## 2023-08-07 DIAGNOSIS — R112 Nausea with vomiting, unspecified: Secondary | ICD-10-CM | POA: Diagnosis not present

## 2023-08-07 DIAGNOSIS — R109 Unspecified abdominal pain: Secondary | ICD-10-CM | POA: Diagnosis not present

## 2023-08-07 DIAGNOSIS — Z79899 Other long term (current) drug therapy: Secondary | ICD-10-CM | POA: Diagnosis not present

## 2023-08-07 DIAGNOSIS — K449 Diaphragmatic hernia without obstruction or gangrene: Secondary | ICD-10-CM | POA: Diagnosis not present

## 2023-08-07 DIAGNOSIS — K7689 Other specified diseases of liver: Secondary | ICD-10-CM | POA: Diagnosis not present

## 2023-08-07 DIAGNOSIS — R1084 Generalized abdominal pain: Secondary | ICD-10-CM | POA: Diagnosis not present

## 2023-08-07 LAB — TROPONIN I (HIGH SENSITIVITY)
Troponin I (High Sensitivity): 5 ng/L (ref <18)
Troponin I (High Sensitivity): 11 ng/L (ref <18)

## 2023-08-07 LAB — COMPREHENSIVE METABOLIC PANEL WITH GFR
ALT: 26 U/L (ref 0–44)
AST: 27 U/L (ref 15–41)
Albumin: 4.9 g/dL (ref 3.5–5.0)
Alkaline Phosphatase: 97 U/L (ref 38–126)
Anion gap: 15 (ref 5–15)
BUN: 25 mg/dL — ABNORMAL HIGH (ref 8–23)
CO2: 18 mmol/L — ABNORMAL LOW (ref 22–32)
Calcium: 10.2 mg/dL (ref 8.9–10.3)
Chloride: 107 mmol/L (ref 98–111)
Creatinine, Ser: 1.28 mg/dL — ABNORMAL HIGH (ref 0.61–1.24)
GFR, Estimated: >60 mL/min (ref >60)
Glucose, Bld: 173 mg/dL — ABNORMAL HIGH (ref 70–99)
Potassium: 3.8 mmol/L (ref 3.5–5.1)
Sodium: 140 mmol/L (ref 135–145)
Total Bilirubin: 0.6 mg/dL (ref 0.0–1.2)
Total Protein: 8.4 g/dL — ABNORMAL HIGH (ref 6.5–8.1)

## 2023-08-07 LAB — CBC WITH DIFFERENTIAL/PLATELET
Abs Immature Granulocytes: 0.07 10*3/uL (ref 0.00–0.07)
Basophils Absolute: 0 10*3/uL (ref 0.0–0.1)
Basophils Relative: 0 %
Eosinophils Absolute: 0 10*3/uL (ref 0.0–0.5)
Eosinophils Relative: 0 %
HCT: 43.2 % (ref 39.0–52.0)
Hemoglobin: 14.7 g/dL (ref 13.0–17.0)
Immature Granulocytes: 1 %
Lymphocytes Relative: 4 %
Lymphs Abs: 0.6 10*3/uL — ABNORMAL LOW (ref 0.7–4.0)
MCH: 28.8 pg (ref 26.0–34.0)
MCHC: 34 g/dL (ref 30.0–36.0)
MCV: 84.7 fL (ref 80.0–100.0)
Monocytes Absolute: 0.8 10*3/uL (ref 0.1–1.0)
Monocytes Relative: 6 %
Neutro Abs: 13.2 10*3/uL — ABNORMAL HIGH (ref 1.7–7.7)
Neutrophils Relative %: 89 %
Platelets: 315 10*3/uL (ref 150–400)
RBC: 5.1 MIL/uL (ref 4.22–5.81)
RDW: 14 % (ref 11.5–15.5)
WBC: 14.6 10*3/uL — ABNORMAL HIGH (ref 4.0–10.5)
nRBC: 0 % (ref 0.0–0.2)

## 2023-08-07 LAB — MAGNESIUM: Magnesium: 1.7 mg/dL (ref 1.7–2.4)

## 2023-08-07 LAB — LIPASE, BLOOD: Lipase: 25 U/L (ref 11–51)

## 2023-08-07 MED ORDER — MORPHINE SULFATE (PF) 4 MG/ML IV SOLN
4.0000 mg | Freq: Once | INTRAVENOUS | Status: AC
  Administered 2023-08-07: 4 mg via INTRAVENOUS
  Filled 2023-08-07: qty 1

## 2023-08-07 MED ORDER — SODIUM CHLORIDE 0.9 % IV BOLUS
1000.0000 mL | Freq: Once | INTRAVENOUS | Status: AC
  Administered 2023-08-07: 1000 mL via INTRAVENOUS

## 2023-08-07 MED ORDER — PANTOPRAZOLE SODIUM 40 MG IV SOLR
40.0000 mg | Freq: Once | INTRAVENOUS | Status: AC
Start: 2023-08-07 — End: 2023-08-07
  Administered 2023-08-07: 40 mg via INTRAVENOUS
  Filled 2023-08-07: qty 10

## 2023-08-07 MED ORDER — IOHEXOL 300 MG/ML  SOLN
100.0000 mL | Freq: Once | INTRAMUSCULAR | Status: AC | PRN
  Administered 2023-08-07: 100 mL via INTRAVENOUS

## 2023-08-07 MED ORDER — ONDANSETRON HCL 4 MG/2ML IJ SOLN
4.0000 mg | Freq: Once | INTRAMUSCULAR | Status: AC
  Administered 2023-08-07: 4 mg via INTRAVENOUS
  Filled 2023-08-07: qty 2

## 2023-08-07 MED ORDER — PROMETHAZINE HCL 25 MG/ML IJ SOLN
INTRAMUSCULAR | Status: AC
  Administered 2023-08-07: 25 mg
  Filled 2023-08-07: qty 1

## 2023-08-07 MED ORDER — DROPERIDOL 2.5 MG/ML IJ SOLN
2.5000 mg | Freq: Once | INTRAMUSCULAR | Status: AC
  Administered 2023-08-07: 2.5 mg via INTRAVENOUS

## 2023-08-07 MED ORDER — DROPERIDOL 2.5 MG/ML IJ SOLN
1.2500 mg | Freq: Once | INTRAMUSCULAR | Status: DC
  Filled 2023-08-07: qty 2

## 2023-08-07 MED ORDER — SODIUM CHLORIDE 0.9 % IV SOLN
25.0000 mg | Freq: Once | INTRAVENOUS | Status: DC
  Filled 2023-08-07: qty 1

## 2023-08-07 NOTE — ED Notes (Signed)
 ED Provider at bedside.

## 2023-08-07 NOTE — ED Provider Notes (Signed)
 [facility] Provider Note   CSN: [id number] Arrival date & time: 08/07/23  1829     History {Add pertinent medical, surgical, social history, OB history to HPI:1} Chief Complaint  Patient presents with   Emesis    Benjamin Valdez is a 63 y.o. male.  He is brought in by his wife for evaluation of nausea vomiting generalized abdominal pain that started last evening.  He said he suffers from cannabis hyperemesis syndrome and did smoke some marijuana yesterday.  He denies any fevers or chills no diarrhea or constipation.  He says this is similar to prior exacerbations of this.  No blood in the vomitus  The history is provided by the patient and the spouse.  Emesis Severity:  Moderate Duration:  24 hours Timing:  Constant Chronicity:  Recurrent Relieved by:  Nothing Ineffective treatments:  Antiemetics Associated symptoms: abdominal pain   Associated symptoms: no cough, no diarrhea, no fever and no headaches        Home Medications Prior to Admission medications   Medication Sig Start Date End Date Taking? Authorizing Provider  ALPRAZolam  (XANAX ) 1 MG tablet Take 1 mg by mouth daily as needed for anxiety. 06/08/22   [provider]  famotidine  (PEPCID ) 20 MG tablet Take 1 tablet (20 mg total) by mouth 2 (two) times daily. 05/11/23 06/10/23  Arvilla Birmingham, MD  FLUoxetine  (PROZAC ) 20 MG capsule Take 20 mg by mouth every morning. TDD 60mg     [provider]  FLUoxetine  (PROZAC ) 40 MG capsule Take 40 mg by mouth daily. TDD 60mg     [provider]  metoCLOPramide  (REGLAN ) 10 MG tablet Take 1 tablet (10 mg total) by mouth every 8 (eight) hours as needed for up to 10 doses for refractory nausea / vomiting. 05/11/23   Arvilla Birmingham, MD  omeprazole  (PRILOSEC) 40 MG capsule TAKE 1 CAPSULE BY MOUTH EVERY DAY 09/09/22   Sylvan Evener, MD  ondansetron  (ZOFRAN ) 4 MG tablet Take 1 tablet (4 mg total) by mouth every 4  (four) hours as needed for nausea or vomiting. 03/16/23   Rai, Hurman Maiden, MD  promethazine  (PHENERGAN ) 25 MG suppository Place 1 suppository (25 mg total) rectally every 6 (six) hours as needed for nausea or vomiting. 03/13/23   Rai, Hurman Maiden, MD  promethazine  (PHENERGAN ) 25 MG tablet Take 1 tablet (25 mg total) by mouth every 6 (six) hours as needed for nausea or vomiting. 03/13/23   Rai, Ripudeep Linnell Richardson, MD  QUEtiapine  (SEROQUEL ) 50 MG tablet Take 150 mg by mouth at bedtime. 12/01/22   [provider]  REXULTI  1 MG TABS tablet Take 1 mg by mouth every morning. 02/22/23   [provider]  rosuvastatin  (CRESTOR ) 20 MG tablet TAKE 1 TABLET BY MOUTH EVERY DAY 12/13/22   Sylvan Evener, MD      Allergies    Patient has no known allergies.    Review of Systems   Review of Systems  Constitutional:  Negative for fever.  Respiratory:  Negative for cough.   Cardiovascular:  Negative for chest pain.  Gastrointestinal:  Positive for abdominal pain and vomiting. Negative for diarrhea.  Genitourinary:  Negative for dysuria.  Neurological:  Negative for headaches.    Physical Exam Updated Vital Signs Ht 6' (1.829 m)   Wt 81.6 kg   BMI 24.41 kg/m  Physical Exam Vitals and nursing note reviewed.  Constitutional:      General: He is not in  acute distress.    Appearance: Normal appearance. He is well-developed.  HENT:     Head: Normocephalic and atraumatic.  Eyes:     Conjunctiva/sclera: Conjunctivae normal.  Cardiovascular:     Rate and Rhythm: Normal rate and regular rhythm.     Heart sounds: No murmur heard. Pulmonary:     Effort: Pulmonary effort is normal. No respiratory distress.     Breath sounds: Normal breath sounds.  Abdominal:     Palpations: Abdomen is soft.     Tenderness: There is abdominal tenderness (general). There is no guarding or rebound.  Musculoskeletal:        General: No swelling.     Cervical back: Neck supple.  Skin:    General: Skin is warm and  dry.     Capillary Refill: Capillary refill takes less than 2 seconds.  Neurological:     General: No focal deficit present.     Mental Status: He is alert.     ED Results / Procedures / Treatments   Labs (all labs ordered are listed, but only abnormal results are displayed) Labs Reviewed - No data to display  EKG None  Radiology No results found.  Procedures Procedures  {Document cardiac monitor, telemetry assessment procedure when appropriate:1}  Medications Ordered in ED Medications  sodium chloride  0.9 % bolus 1,000 mL (has no administration in time range)  pantoprazole  (PROTONIX ) injection 40 mg (has no administration in time range)  droperidol  (INAPSINE ) 2.5 MG/ML injection 1.25 mg (has no administration in time range)    ED Course/ Medical Decision Making/ A&P   {   Click here for ABCD2, HEART and other calculatorsREFRESH Note before signing :1}                              Medical Decision Making Amount and/or Complexity of Data Reviewed Labs: ordered.  Risk Prescription drug management.   This patient complains of ***; this involves an extensive number of treatment Options and is a complaint that carries with it a high risk of complications and morbidity. The differential includes ***  I ordered, reviewed and interpreted labs, which included *** I ordered medication *** and reviewed PMP when indicated. I ordered imaging studies which included *** and I independently    visualized and interpreted imaging which showed *** Additional history obtained from *** Previous records obtained and reviewed *** I consulted *** and discussed lab and imaging findings and discussed disposition.  Cardiac monitoring reviewed, *** Social determinants considered, *** Critical Interventions: ***  After the interventions stated above, I reevaluated the patient and found *** Admission and further testing considered, ***   {Document critical care time when  appropriate:1} {Document review of labs and clinical decision tools ie heart score, Chads2Vasc2 etc:1}  {Document your independent review of radiology images, and any outside records:1} {Document your discussion with family members, caretakers, and with consultants:1} {Document social determinants of health affecting pt's care:1} {Document your decision making why or why not admission, treatments were needed:1} Final Clinical Impression(s) / ED Diagnoses Final diagnoses:  None    Rx / DC Orders ED Discharge Orders     None

## 2023-08-07 NOTE — Discharge Instructions (Addendum)
 Continue to take outpatient antiemetics, follow-up with your primary care provider.  Your CT imaging was negative.  Your symptoms are consistent with cannabis hyperemesis syndrome.

## 2023-08-07 NOTE — ED Notes (Signed)
 Patient transported to CT

## 2023-08-07 NOTE — ED Provider Notes (Signed)
  Physical Exam  BP (!) 172/100   Pulse 93   Temp 98 F (36.7 C) (Oral)   Resp 20   Ht 6' (1.829 m)   Wt 81.6 kg   SpO2 92%   BMI 24.41 kg/m   Physical Exam  Procedures  Procedures  ED Course / MDM   Clinical Course as of 08/07/23 2313  Sat Aug 07, 2023  2028 He said his pain and nausea are improved but not completely resolved. [MB]  2139 He said he started feel little bit better now and is tolerating ice chips.  Will continue to observe. [MB]  2249 Patient states he is still nauseous but his pain is resolved. [MB]  2307 Patient's care signed out to Dr. Adrain Alar to follow-up on response to nausea medication and p.o. trial.  Hopefully will be able to be discharged [MB]    Clinical Course User Index [MB] Tonya Fredrickson, MD   Medical Decision Making Amount and/or Complexity of Data Reviewed Labs: ordered. Radiology: ordered.  Risk Prescription drug management.   89M hx of cannabinoid hyperemesis syndrome, here with n&v, getting fluids and meds, pending PO challenge and reassessment.    On my evaluation, the patient states that his abdominal pain is improved.  He does state that he has not been passing gas today.  He is diffusely tender on exam.  Will obtain CT of the abdomen pelvis to further evaluate to rule out bowel obstruction.  If negative, will plan for p.o. challenge and likely discharge.  CT:  IMPRESSION:  1. No acute abnormality in the abdomen or pelvis.  2. Hepatic steatosis.  3. Partially visualized bilateral hydroceles.  4. Aortic Atherosclerosis (ICD10-I70.0).   Patient tolerating p.o., feeling symptomatically improved, stable for discharge at this time.    Rosealee Concha, MD 08/08/23 Ardena Koyanagi

## 2023-08-07 NOTE — ED Notes (Signed)
 Pt placed on 3lt [place] due to SATS dropping from meds given.

## 2023-08-07 NOTE — ED Triage Notes (Signed)
 Pt with NV, abd pain since last night; family reports pt has hx of CHS

## 2023-08-08 DIAGNOSIS — K7689 Other specified diseases of liver: Secondary | ICD-10-CM | POA: Diagnosis not present

## 2023-08-08 DIAGNOSIS — K449 Diaphragmatic hernia without obstruction or gangrene: Secondary | ICD-10-CM | POA: Diagnosis not present

## 2023-08-08 DIAGNOSIS — R109 Unspecified abdominal pain: Secondary | ICD-10-CM | POA: Diagnosis not present

## 2023-08-08 NOTE — ED Notes (Addendum)
 Pt stated he is able to drink small sips of water and keep it down. Still nauseated but no emesis

## 2023-08-09 ENCOUNTER — Other Ambulatory Visit: Payer: Self-pay

## 2023-08-09 ENCOUNTER — Telehealth: Payer: Self-pay

## 2023-08-09 MED ORDER — PROMETHAZINE HCL 25 MG RE SUPP: 25.0000 mg | Freq: Four times a day (QID) | RECTAL | 0 refills | Status: AC | PRN

## 2023-08-09 NOTE — Transitions of Care (Post Inpatient/ED Visit) (Signed)
 08/09/2023  Name: Benjamin Valdez MRN: [id number] DOB: August 08, 1960  Today's TOC FU Call Status: Today's TOC FU Call Status:: Successful TOC FU Call Completed TOC FU Call Complete Date: 08/09/23 Patient's Name and Date of Birth confirmed.  Transition Care Management Follow-up Telephone Call Date of Discharge: 08/08/23 Discharge Facility: [facility] Type of Discharge: Emergency Department Reason for ED Visit: Other: (Emesis) How have you been since you were released from the hospital?: Same Any questions or concerns?: No  Items Reviewed: Did you receive and understand the discharge instructions provided?: Yes Medications obtained,verified, and reconciled?: Yes (Medications Reviewed) Any new allergies since your discharge?: No Dietary orders reviewed?: NA Do you have support at home?: Yes  Medications Reviewed Today: Medications Reviewed Today     Reviewed by Chauncy Coral, CMA (Certified Medical Assistant) on 08/09/23 at 1138  Med List Status: <None>   Medication Order Taking? Sig Documenting Provider Last Dose Status Informant  ALPRAZolam  (XANAX ) 1 MG tablet [id number] No Take 1 mg by mouth daily as needed for anxiety. [provider] Taking Active   famotidine  (PEPCID ) 20 MG tablet [id number]  Take 1 tablet (20 mg total) by mouth 2 (two) times daily. Arvilla Birmingham, MD  Expired 06/10/23 2359   FLUoxetine  (PROZAC ) 20 MG capsule [id number] No Take 20 mg by mouth every morning. TDD 60mg  [provider] Taking Active   FLUoxetine  (PROZAC ) 40 MG capsule [id number] No Take 40 mg by mouth daily. TDD 60mg  [provider] Taking Active   hydrogen peroxide  3 % external solution [id number]   Baxley, Jaynie Meyers, MD  Active   metoCLOPramide  (REGLAN ) 10 MG tablet [id number]  Take 1 tablet (10 mg total) by mouth every 8 (eight) hours as needed for up to 10 doses for refractory nausea / vomiting. Arvilla Birmingham, MD  Active   mupirocin  cream (BACTROBAN ) 2  % [id number]   Sylvan Evener, MD  Active   omeprazole  (PRILOSEC) 40 MG capsule [id number] No TAKE 1 CAPSULE BY MOUTH EVERY DAY Baxley, Jaynie Meyers, MD Taking Active   ondansetron  (ZOFRAN ) 4 MG tablet [id number] No Take 1 tablet (4 mg total) by mouth every 4 (four) hours as needed for nausea or vomiting. Rai, Hurman Maiden, MD Taking Active   promethazine  (PHENERGAN ) 25 MG suppository [id number] No Place 1 suppository (25 mg total) rectally every 6 (six) hours as needed for nausea or vomiting. Rai, Hurman Maiden, MD Taking Active   promethazine  (PHENERGAN ) 25 MG tablet [id number] No Take 1 tablet (25 mg total) by mouth every 6 (six) hours as needed for nausea or vomiting. Rai, Hurman Maiden, MD Taking Active   QUEtiapine  (SEROQUEL ) 50 MG tablet [id number] No Take 150 mg by mouth at bedtime. [provider] Taking Active            Med Note Micah Ade   Fri Mar 12, 2023  7:45 AM) Confirmed 150mg  at bedtime.   REXULTI  1 MG TABS tablet [id number] No Take 1 mg by mouth every morning. [provider] Taking Active   rosuvastatin  (CRESTOR ) 20 MG tablet [id number] No TAKE 1 TABLET BY MOUTH EVERY DAY Baxley, Jaynie Meyers, MD Taking Active             Home Care and Equipment/Supplies: Were Home Health Services Ordered?: NA Any new equipment or medical supplies ordered?: NA  Functional Questionnaire: Do you need assistance with bathing/showering or dressing?: No Do you need assistance with meal preparation?: No Do you need assistance with  eating?: No Do you have difficulty maintaining continence: No Do you need assistance with getting out of bed/getting out of a chair/moving?: No Do you have difficulty managing or taking your medications?: No  Follow up appointments reviewed: PCP Follow-up appointment confirmed?: No (pt declined) Specialist Hospital Follow-up appointment confirmed?: NA Do you need transportation to your follow-up appointment?: No Do you understand care options if your  condition(s) worsen?: Yes-patient verbalized understanding    SIGNATURE Germain Kohler, CMA (AAMA)  [facility] [phone]

## 2023-08-11 DIAGNOSIS — F331 Major depressive disorder, recurrent, moderate: Secondary | ICD-10-CM | POA: Diagnosis not present

## 2023-08-11 DIAGNOSIS — F411 Generalized anxiety disorder: Secondary | ICD-10-CM | POA: Diagnosis not present

## 2023-08-12 DIAGNOSIS — Z89512 Acquired absence of left leg below knee: Secondary | ICD-10-CM | POA: Diagnosis not present

## 2023-08-23 DIAGNOSIS — F411 Generalized anxiety disorder: Secondary | ICD-10-CM | POA: Diagnosis not present

## 2023-08-23 DIAGNOSIS — F331 Major depressive disorder, recurrent, moderate: Secondary | ICD-10-CM | POA: Diagnosis not present

## 2023-08-24 DIAGNOSIS — F411 Generalized anxiety disorder: Secondary | ICD-10-CM | POA: Diagnosis not present

## 2023-08-24 DIAGNOSIS — F331 Major depressive disorder, recurrent, moderate: Secondary | ICD-10-CM | POA: Diagnosis not present

## 2023-09-10 DIAGNOSIS — F331 Major depressive disorder, recurrent, moderate: Secondary | ICD-10-CM | POA: Diagnosis not present

## 2023-09-11 DIAGNOSIS — Z0100 Encounter for examination of eyes and vision without abnormal findings: Secondary | ICD-10-CM | POA: Diagnosis not present

## 2023-09-21 DIAGNOSIS — S88119D Complete traumatic amputation at level between knee and ankle, unspecified lower leg, subsequent encounter: Secondary | ICD-10-CM | POA: Diagnosis not present

## 2023-09-21 DIAGNOSIS — M810 Age-related osteoporosis without current pathological fracture: Secondary | ICD-10-CM | POA: Diagnosis not present

## 2023-09-21 DIAGNOSIS — Z89512 Acquired absence of left leg below knee: Secondary | ICD-10-CM | POA: Diagnosis not present

## 2023-09-24 DIAGNOSIS — F331 Major depressive disorder, recurrent, moderate: Secondary | ICD-10-CM | POA: Diagnosis not present

## 2023-09-24 DIAGNOSIS — F411 Generalized anxiety disorder: Secondary | ICD-10-CM | POA: Diagnosis not present

## 2023-09-30 DIAGNOSIS — F411 Generalized anxiety disorder: Secondary | ICD-10-CM | POA: Diagnosis not present

## 2023-09-30 DIAGNOSIS — F331 Major depressive disorder, recurrent, moderate: Secondary | ICD-10-CM | POA: Diagnosis not present

## 2023-10-12 ENCOUNTER — Other Ambulatory Visit: Payer: Medicare HMO

## 2023-10-12 DIAGNOSIS — E119 Type 2 diabetes mellitus without complications: Secondary | ICD-10-CM

## 2023-10-12 DIAGNOSIS — E7800 Pure hypercholesterolemia, unspecified: Secondary | ICD-10-CM

## 2023-10-12 NOTE — Progress Notes (Signed)
 Lab only

## 2023-10-13 LAB — LIPID PANEL
Cholesterol: 233 mg/dL — ABNORMAL HIGH (ref <200)
HDL: 66 mg/dL (ref >=40)
LDL Cholesterol (Calc): 147 mg/dL — ABNORMAL HIGH
Non-HDL Cholesterol (Calc): 167 mg/dL — ABNORMAL HIGH (ref <130)
Total CHOL/HDL Ratio: 3.5 (calc) (ref <5.0)
Triglycerides: 95 mg/dL (ref <150)

## 2023-10-13 LAB — HEPATIC FUNCTION PANEL
AG Ratio: 2 (calc) (ref 1.0–2.5)
ALT: 16 U/L (ref 9–46)
AST: 17 U/L (ref 10–35)
Albumin: 4.9 g/dL (ref 3.6–5.1)
Alkaline phosphatase (APISO): 85 U/L (ref 35–144)
Bilirubin, Direct: 0.1 mg/dL (ref 0.0–0.2)
Globulin: 2.5 g/dL (ref 1.9–3.7)
Indirect Bilirubin: 0.6 mg/dL (ref 0.2–1.2)
Total Bilirubin: 0.7 mg/dL (ref 0.2–1.2)
Total Protein: 7.4 g/dL (ref 6.1–8.1)

## 2023-10-13 LAB — HEMOGLOBIN A1C
Hgb A1c MFr Bld: 6.1 % — ABNORMAL HIGH (ref <5.7)
Mean Plasma Glucose: 128 mg/dL
eAG (mmol/L): 7.1 mmol/L

## 2023-10-14 ENCOUNTER — Other Ambulatory Visit: Payer: Medicare HMO

## 2023-10-14 ENCOUNTER — Ambulatory Visit: Payer: Medicare HMO | Admitting: Internal Medicine

## 2023-10-18 DIAGNOSIS — F331 Major depressive disorder, recurrent, moderate: Secondary | ICD-10-CM | POA: Diagnosis not present

## 2023-10-18 DIAGNOSIS — F411 Generalized anxiety disorder: Secondary | ICD-10-CM | POA: Diagnosis not present

## 2023-10-18 NOTE — Progress Notes (Signed)
 Patient Care Team: Benjamin Benjamin PARAS, MD as PCP - General (Internal Medicine) Benjamin Rigg, MD (Inactive) as Consulting Physician (Dermatology)  Visit Date: 10/19/23  Subjective:   Chief Complaint  Patient presents with   Follow-up    Patient stated nothing to really discuss. Patient mentioned his cholesterol and A1c a little higher than he would like but states he thinks he can handle them himself.    Vitals:   10/19/23 1518 10/19/23 1545  BP: (!) 138/100 120/88   Patient PI:[id number] Benjamin Valdez, [name] DOB:1961/01/15,63 y.o. FMW:[id number]   63 y.o.Male presents today for 6 months follow-up for Hyperlipidemia; Impaired Glucose Tolerance; Anxiety/Depression. Patient has a past medical history of GERD; Cyclical Vomiting; Melanoma; Angiosarcoma; S/p Left BKA. Seen 04/12/2023 for his annual visit, in the interim has seen multiple times at Plastic Surgery, Ortho Surg, Dermatology, and Psychology.   History of Hyperlipidemia treated with  Rosuvastatin  20 mg daily. 10/12/2023 Lipid Panel, compared to 04/2023: Cholesterol 233, elevated from 183; LDL 147, elevated from 109.   History of Impaired Glucose Tolerance managed with diet and exercise. 10/12/2023 HgbA1c 6.1, elevated from 5.9 in 04/2023 and 6.0 in 02/2023. Eye exam 10/20/2022, repeat due.   Reviewed 10/12/2023 Hepatic Function Panel: WNL.   History of Anxiety/Depression treated with Loreev-XR 2 mg, Lamictal 150 mg daily, Prozac  60 mg daily, and Rexulti  1 mg daily. PHQ-9 Score: 12; GAD 7 Score: 9 - says regarding these scores that he's been dealing with some situational depression due to a recent break-up.  History of GERD treated with Omeprazole  40 mg daily; Cyclical Vomiting treated with Phenergan  25 mg PO as needed, and Zofran  as needed. Last seen in the ED on 08/07/2023 for generalized abdominal pain and nausea/vomiting.   History of Melanoma followed by Dermatology.  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   Vaccine Counseling: Tdap - done at [facility] on [address]; due for PNA & Shingrix - discussed.  Past Medical History:  Diagnosis Date   Angiosarcoma (HCC)    Of the left leg   Atypical mole 07/14/1996   Upper Mid Back (slight)   Atypical nevus 07/14/1996   Mid/Lower Right Back (slight to moderate)   Atypical nevus 12/28/1996   Right Mid Back (slight to moderate)   Atypical nevus 12/28/1996   Mid Lower Back (slight to moderate) terril)   Atypical nevus 10/26/2000   Right Abdomen (moderate)   Atypical nevus 07/27/2003   Left Lower Back (moderate) (widershave)   Atypical nevus 07/27/2003   Left Lower Back (mid) (slight) (widershave)   Atypical nevus 07/27/2003   Right Lower Back (inf) (slight) (widershave)   Atypical nevus 07/27/2003   Right Lower Back (sup) (slight) (widershave)   Atypical nevus 07/27/2003   Right Back (slight)   Atypical nevus 07/27/2003   Right Center Back (sup) (slight)   Atypical nevus 07/27/2003   Right Center Back (inf) (mild)   Atypical nevus 07/27/2003   Right Side (mild)   Atypical nevus 07/27/2003   Right Mid Back (slight) (4mm punch widershave)   Atypical nevus 03/10/2005   Left Abdomen (moderate to severe) terril)   Atypical nevus 03/10/2005   Left Upper Back,sup (moderate)   Atypical nevus 03/10/2005   Left Upper Back,medial (moderate) (observe)   Atypical nevus 03/10/2005   Left Upper Back, inf (moderate)   Atypical nevus 04/22/2006   Right Mid Back (marked) (widershave)   Bone cancer (HCC)    has left BK amputation  Chest pain April 2010   Myocardial Infarction ruled out   Dyslipidemia    Essential hypertension    Gastritis April 2010   Acute--Resolved   Hiatal hernia    Marijuana abuse    History of Marijuana use   Melanoma (HCC) 10/02/2019   LEFT LOWER BACK MM INSITU   Nausea alone    Recurrent   Neck problem    Vomiting     No Known Allergies  Family History  Problem Relation Age of Onset    Diabetes Mother    Bladder Cancer Father    Crohn's disease Brother    Social History   Social History Narrative   Wears glasses    Right handed    Drinks coffee 3 cups per week   Drinks sodas 3-4 times per week.   Review of Systems  Constitutional:  Negative for fever and malaise/fatigue.  HENT:  Negative for congestion.   Eyes:  Negative for blurred vision.  Respiratory:  Negative for cough and shortness of breath.   Cardiovascular:  Negative for chest pain, palpitations and leg swelling.  Gastrointestinal:  Negative for vomiting.  Musculoskeletal:  Negative for back pain.  Skin:  Negative for rash.  Neurological:  Negative for loss of consciousness and headaches.     Objective:  Vitals: BP 120/88   Pulse 93   Temp 98.6 F (37 C) (Temporal)   Ht 6' (1.829 m)   Wt 182 lb 1.9 oz (82.6 kg)   SpO2 92%   BMI 24.70 kg/m   Physical Exam Constitutional:      General: He is not in acute distress.    Appearance: Normal appearance. He is not ill-appearing.  HENT:     Head: Normocephalic and atraumatic.   Cardiovascular:     Rate and Rhythm: Normal rate and regular rhythm.     Pulses: Normal pulses.          Dorsalis pedis pulses are 2+ on the right side.       Posterior tibial pulses are 2+ on the right side.     Heart sounds: Normal heart sounds. No murmur heard.    No friction rub. No gallop.  Pulmonary:     Effort: Pulmonary effort is normal. No respiratory distress.     Breath sounds: Normal breath sounds. No wheezing or rales.   Musculoskeletal:     Amputation Left Lower Extremity: Left leg is amputated below knee.  Feet:     Right foot:     Skin integrity: Skin integrity normal. No ulcer, blister, skin breakdown, erythema, warmth, callus, dry skin or fissure.   Skin:    General: Skin is warm and dry.   Neurological:     Mental Status: He is alert and oriented to person, place, and time. Mental status is at baseline.   Psychiatric:        Mood and Affect:  Mood normal.        Behavior: Behavior normal.        Thought Content: Thought content normal.        Judgment: Judgment normal.    Prostate is normal without masses.   Results:  Studies Obtained And Personally Reviewed By Me:    10/19/2023    3:28 PM  Depression screen PHQ 2/9  Decreased Interest 2  Down, Depressed, Hopeless 2  PHQ - 2 Score 4  Altered sleeping 3  Tired, decreased energy 2  Change in appetite 1  Feeling bad or failure about  yourself  1  Trouble concentrating 1  Moving slowly or fidgety/restless 0  Suicidal thoughts 0  PHQ-9 Score 12  Difficult doing work/chores Somewhat difficult      10/19/2023    3:28 PM  GAD 7 : Generalized Anxiety Score  Nervous, Anxious, on Edge 2  Control/stop worrying 2  Worry too much - different things 2  Trouble relaxing 1  Restless 1  Easily annoyed or irritable 0  Afraid - awful might happen 1  Total GAD 7 Score 9  Anxiety Difficulty Not difficult at all  Labs:     Component Value Date/Time   NA 140 08/07/2023 1858   K 3.8 08/07/2023 1858   CL 107 08/07/2023 1858   CO2 18 (L) 08/07/2023 1858   GLUCOSE 173 (H) 08/07/2023 1858   BUN 25 (H) 08/07/2023 1858   CREATININE 1.28 (H) 08/07/2023 1858   CREATININE 1.03 05/04/2023 0929   CALCIUM  10.2 08/07/2023 1858   PROT 7.4 10/12/2023 0937   ALBUMIN 4.9 08/07/2023 1858   AST 17 10/12/2023 0937   ALT 16 10/12/2023 0937   ALKPHOS 97 08/07/2023 1858   BILITOT 0.7 10/12/2023 0937   GFRNONAA >60 08/07/2023 1858   GFRNONAA 57 (L) 12/29/2019 0935   GFRAA 67 12/29/2019 0935    Lab Results  Component Value Date   WBC 14.6 (H) 08/07/2023   HGB 14.7 08/07/2023   HCT 43.2 08/07/2023   MCV 84.7 08/07/2023   PLT 315 08/07/2023   Lab Results  Component Value Date   CHOL 233 (H) 10/12/2023   HDL 66 10/12/2023   LDLCALC 147 (H) 10/12/2023   TRIG 95 10/12/2023   CHOLHDL 3.5 10/12/2023   Lab Results  Component Value Date   HGBA1C 6.1 (H) 10/12/2023    Lab Results   Component Value Date   TSH 2.76 10/13/2022    Lab Results  Component Value Date   PSA 1.00 05/04/2023   PSA 1.31 04/07/2022   PSA 1.35 02/25/2021     Assessment & Plan:   Hyperlipidemia treated with  Rosuvastatin  20 mg daily. 10/12/2023 Lipid Panel, compared to 04/2023: Cholesterol 233, elevated from 183; LDL 147, elevated from 109.   Impaired Glucose Tolerance managed with diet and exercise. 10/12/2023 HgbA1c 6.1, elevated from 5.9 %in 04/2023 and 6.0%  in 02/2023. Eye exam 10/20/2022, repeat due.   Reviewed 10/12/2023 Hepatic Function Panel: WNL.   Anxiety/Depression treated with Loreev-XR 2 mg, Lamictal 150 mg daily, Prozac  60 mg daily, and Rexulti  1 mg daily. PHQ-9 Score: 12; GAD 7 Score: 9 - says regarding these scores that he's been dealing with some situational depression due to a recent break-up.  GERD treated with Omeprazole  40 mg daily; Cyclical Vomiting treated with Phenergan  25 mg PO as needed, and Zofran  as needed. Last seen in the ED on 08/07/2023 for generalized abdominal pain and nausea/vomiting.   History of Melanoma followed by Dermatology.  History of Angiosarcoma, Left Leg S/p Left BKA in 1998 w/ left leg prosthesis.   Blood Pressure Elevated at 138/100 initially, normalized to 120/88. Asked patient to monitor at home and call if persistently elevated.  Vaccine Counseling: Tdap - done at [facility] on [address]; due for PNA & Shingrix - discussed.   Wellness - Patient had Medicare Wellness visit on April 09, 2023. He needs to book Annual medicare wellness visit for December soon.  Labs reviewed today and was given a thorough physical exam today.    I,Benjamin Valdez,acting as a Neurosurgeon for [employer]  JINNY Hailstone, MD.,have documented all relevant documentation on the behalf of Benjamin JINNY Hailstone, MD,as directed by  Benjamin JINNY Hailstone, MD while in the presence of Benjamin JINNY Hailstone, MD.   I, Benjamin JINNY Hailstone, MD, have reviewed all documentation for this visit. The documentation on 10/25/23 for the  exam, diagnosis, procedures, and orders are all accurate and complete.

## 2023-10-19 ENCOUNTER — Ambulatory Visit: Admitting: Internal Medicine

## 2023-10-19 DIAGNOSIS — K219 Gastro-esophageal reflux disease without esophagitis: Secondary | ICD-10-CM

## 2023-10-19 DIAGNOSIS — R030 Elevated blood-pressure reading, without diagnosis of hypertension: Secondary | ICD-10-CM | POA: Diagnosis not present

## 2023-10-19 DIAGNOSIS — F418 Other specified anxiety disorders: Secondary | ICD-10-CM | POA: Diagnosis not present

## 2023-10-19 DIAGNOSIS — Z8589 Personal history of malignant neoplasm of other organs and systems: Secondary | ICD-10-CM | POA: Diagnosis not present

## 2023-10-19 DIAGNOSIS — E785 Hyperlipidemia, unspecified: Secondary | ICD-10-CM | POA: Diagnosis not present

## 2023-10-19 DIAGNOSIS — Z85820 Personal history of malignant melanoma of skin: Secondary | ICD-10-CM

## 2023-10-19 DIAGNOSIS — E7800 Pure hypercholesterolemia, unspecified: Secondary | ICD-10-CM

## 2023-10-19 DIAGNOSIS — R7302 Impaired glucose tolerance (oral): Secondary | ICD-10-CM | POA: Diagnosis not present

## 2023-10-19 DIAGNOSIS — F419 Anxiety disorder, unspecified: Secondary | ICD-10-CM

## 2023-10-19 DIAGNOSIS — Z89512 Acquired absence of left leg below knee: Secondary | ICD-10-CM

## 2023-10-19 DIAGNOSIS — E119 Type 2 diabetes mellitus without complications: Secondary | ICD-10-CM

## 2023-10-19 DIAGNOSIS — R1115 Cyclical vomiting syndrome unrelated to migraine: Secondary | ICD-10-CM

## 2023-10-25 ENCOUNTER — Encounter: Payer: Self-pay | Admitting: Internal Medicine

## 2023-10-25 DIAGNOSIS — F331 Major depressive disorder, recurrent, moderate: Secondary | ICD-10-CM | POA: Diagnosis not present

## 2023-10-25 DIAGNOSIS — F411 Generalized anxiety disorder: Secondary | ICD-10-CM | POA: Diagnosis not present

## 2023-10-25 NOTE — Patient Instructions (Addendum)
 It was a pleasure to see you today. Please continue current meds and follow up late December for Medicare wellness visit and health maintenance exam. Labs reviewed. Take lipid  lowering medication daily .

## 2023-11-02 DIAGNOSIS — F411 Generalized anxiety disorder: Secondary | ICD-10-CM | POA: Diagnosis not present

## 2023-11-02 DIAGNOSIS — F331 Major depressive disorder, recurrent, moderate: Secondary | ICD-10-CM | POA: Diagnosis not present

## 2023-11-11 DIAGNOSIS — F331 Major depressive disorder, recurrent, moderate: Secondary | ICD-10-CM | POA: Diagnosis not present

## 2023-11-11 DIAGNOSIS — F411 Generalized anxiety disorder: Secondary | ICD-10-CM | POA: Diagnosis not present

## 2023-12-03 ENCOUNTER — Other Ambulatory Visit: Payer: Self-pay | Admitting: Internal Medicine

## 2023-12-14 ENCOUNTER — Other Ambulatory Visit: Payer: Self-pay | Admitting: Internal Medicine

## 2023-12-15 DIAGNOSIS — F331 Major depressive disorder, recurrent, moderate: Secondary | ICD-10-CM | POA: Diagnosis not present

## 2023-12-15 DIAGNOSIS — F411 Generalized anxiety disorder: Secondary | ICD-10-CM | POA: Diagnosis not present

## 2023-12-23 DIAGNOSIS — F411 Generalized anxiety disorder: Secondary | ICD-10-CM | POA: Diagnosis not present

## 2023-12-23 DIAGNOSIS — F331 Major depressive disorder, recurrent, moderate: Secondary | ICD-10-CM | POA: Diagnosis not present

## 2023-12-27 DIAGNOSIS — D229 Melanocytic nevi, unspecified: Secondary | ICD-10-CM | POA: Diagnosis not present

## 2023-12-27 DIAGNOSIS — Z86018 Personal history of other benign neoplasm: Secondary | ICD-10-CM | POA: Diagnosis not present

## 2023-12-27 DIAGNOSIS — Z85820 Personal history of malignant melanoma of skin: Secondary | ICD-10-CM | POA: Diagnosis not present

## 2023-12-27 DIAGNOSIS — L821 Other seborrheic keratosis: Secondary | ICD-10-CM | POA: Diagnosis not present

## 2023-12-27 DIAGNOSIS — L814 Other melanin hyperpigmentation: Secondary | ICD-10-CM | POA: Diagnosis not present

## 2024-01-05 DIAGNOSIS — F411 Generalized anxiety disorder: Secondary | ICD-10-CM | POA: Diagnosis not present

## 2024-01-05 DIAGNOSIS — F331 Major depressive disorder, recurrent, moderate: Secondary | ICD-10-CM | POA: Diagnosis not present

## 2024-01-20 ENCOUNTER — Other Ambulatory Visit: Payer: Self-pay | Admitting: Internal Medicine

## 2024-01-20 MED ORDER — PROMETHAZINE HCL 25 MG PO TABS: 25.0000 mg | ORAL_TABLET | Freq: Four times a day (QID) | ORAL | 0 refills | Status: AC | PRN

## 2024-01-20 NOTE — Telephone Encounter (Signed)
 Copied from CRM [id number]. Topic: Clinical - Medication Refill >> Jan 20, 2024 12:00 PM Ivette P wrote: Medication: promethazine  (PHENERGAN ) 25 MG tablet  Has the patient contacted their pharmacy? Yes (Agent: If no, request that the patient contact the pharmacy for the refill. If patient does not wish to contact the pharmacy document the reason why and proceed with request.) (Agent: If yes, when and what did the pharmacy advise?)  This is the patient's preferred pharmacy:  [id number] [place], [place] - [address] [place] [address] Phone: [phone] Fax: [phone]   Is this the correct pharmacy for this prescription? Yes If no, delete pharmacy and type the correct one.   Has the prescription been filled recently? No  Is the patient out of the medication? Yes  Has the patient been seen for an appointment in the last year OR does the patient have an upcoming appointment? Yes, 10/19/2023  Can we respond through MyChart? Yes  Agent: Please be advised that Rx refills may take up to 3 business days. We ask that you follow-up with your pharmacy.

## 2024-01-24 DIAGNOSIS — F411 Generalized anxiety disorder: Secondary | ICD-10-CM | POA: Diagnosis not present

## 2024-01-24 DIAGNOSIS — F331 Major depressive disorder, recurrent, moderate: Secondary | ICD-10-CM | POA: Diagnosis not present

## 2024-02-10 DIAGNOSIS — D84822 Immunodeficiency due to external causes: Secondary | ICD-10-CM | POA: Diagnosis not present

## 2024-02-10 DIAGNOSIS — F411 Generalized anxiety disorder: Secondary | ICD-10-CM | POA: Diagnosis not present

## 2024-02-10 DIAGNOSIS — Z85830 Personal history of malignant neoplasm of bone: Secondary | ICD-10-CM | POA: Diagnosis not present

## 2024-02-10 DIAGNOSIS — R269 Unspecified abnormalities of gait and mobility: Secondary | ICD-10-CM | POA: Diagnosis not present

## 2024-02-10 DIAGNOSIS — Y842 Radiological procedure and radiotherapy as the cause of abnormal reaction of the patient, or of later complication, without mention of misadventure at the time of the procedure: Secondary | ICD-10-CM | POA: Diagnosis not present

## 2024-02-10 DIAGNOSIS — K219 Gastro-esophageal reflux disease without esophagitis: Secondary | ICD-10-CM | POA: Diagnosis not present

## 2024-02-10 DIAGNOSIS — I10 Essential (primary) hypertension: Secondary | ICD-10-CM | POA: Diagnosis not present

## 2024-02-10 DIAGNOSIS — Z86006 Personal history of melanoma in-situ: Secondary | ICD-10-CM | POA: Diagnosis not present

## 2024-02-10 DIAGNOSIS — Z9989 Dependence on other enabling machines and devices: Secondary | ICD-10-CM | POA: Diagnosis not present

## 2024-02-10 DIAGNOSIS — G4700 Insomnia, unspecified: Secondary | ICD-10-CM | POA: Diagnosis not present

## 2024-02-10 DIAGNOSIS — F321 Major depressive disorder, single episode, moderate: Secondary | ICD-10-CM | POA: Diagnosis not present

## 2024-02-10 DIAGNOSIS — Z8249 Family history of ischemic heart disease and other diseases of the circulatory system: Secondary | ICD-10-CM | POA: Diagnosis not present

## 2024-02-10 DIAGNOSIS — R45851 Suicidal ideations: Secondary | ICD-10-CM | POA: Diagnosis not present

## 2024-02-10 DIAGNOSIS — E785 Hyperlipidemia, unspecified: Secondary | ICD-10-CM | POA: Diagnosis not present

## 2024-02-15 DIAGNOSIS — H5213 Myopia, bilateral: Secondary | ICD-10-CM | POA: Diagnosis not present

## 2024-02-15 DIAGNOSIS — F331 Major depressive disorder, recurrent, moderate: Secondary | ICD-10-CM | POA: Diagnosis not present

## 2024-02-15 DIAGNOSIS — H43393 Other vitreous opacities, bilateral: Secondary | ICD-10-CM | POA: Diagnosis not present

## 2024-02-21 ENCOUNTER — Encounter: Payer: Self-pay | Admitting: Radiology

## 2024-02-22 DIAGNOSIS — M898X6 Other specified disorders of bone, lower leg: Secondary | ICD-10-CM | POA: Diagnosis not present

## 2024-02-22 DIAGNOSIS — S88112D Complete traumatic amputation at level between knee and ankle, left lower leg, subsequent encounter: Secondary | ICD-10-CM | POA: Diagnosis not present

## 2024-02-22 DIAGNOSIS — F331 Major depressive disorder, recurrent, moderate: Secondary | ICD-10-CM | POA: Diagnosis not present

## 2024-02-22 DIAGNOSIS — F411 Generalized anxiety disorder: Secondary | ICD-10-CM | POA: Diagnosis not present

## 2024-02-22 DIAGNOSIS — Z89512 Acquired absence of left leg below knee: Secondary | ICD-10-CM | POA: Diagnosis not present

## 2024-03-03 DIAGNOSIS — F331 Major depressive disorder, recurrent, moderate: Secondary | ICD-10-CM | POA: Diagnosis not present

## 2024-03-03 DIAGNOSIS — F411 Generalized anxiety disorder: Secondary | ICD-10-CM | POA: Diagnosis not present

## 2024-03-23 DIAGNOSIS — F331 Major depressive disorder, recurrent, moderate: Secondary | ICD-10-CM | POA: Diagnosis not present

## 2024-04-14 ENCOUNTER — Ambulatory Visit: Payer: Self-pay | Admitting: Internal Medicine

## 2024-04-17 NOTE — Progress Notes (Signed)
 "  Annual Wellness Visit   Patient Care Team: Valdez, Benjamin PARAS, MD as PCP - General (Internal Medicine) Benjamin Rigg, MD as Consulting Physician (Dermatology)  Visit Date: 04/27/2024   Chief Complaint  Patient presents with   Annual Exam   Medicare Wellness   Subjective:  Patient: Benjamin Valdez, Male DOB: December 19, 1960, 63 y.o. MRN: [id number] Vitals:   04/27/24 0958  BP: 100/70   Benjamin Valdez is a 63 y.o. Male who presents today for his Annual Wellness Visit. Patient has Hypercholesterolemia; Bone sarcoma (HCC); Hx of BKA, left (HCC); GAD (generalized anxiety disorder); GERD (gastroesophageal reflux disease); Depression with anxiety; Intractable nausea and vomiting; Abdominal pain; Marijuana abuse; Impaired glucose tolerance; Elevated troponin; Frequent PVCs; Hypokalemia; Prolonged QT interval; Controlled type 2 diabetes mellitus without complication, without long-term current use of insulin (HCC); Anxiety; History of sarcoma; and Marijuana use on their problem list.  History of anxiety and depression treated with alprazolam  1 mg daily as needed, fluoxetine  40 mg daily, Vraylar 3 mg.  Seen at [facility].   History of GERD treated with omeprazole  40 mg daily.    History of hyperlipidemia treated with rosuvastatin  20 mg daily.   He has a history of cyclical vomiting treated with phenergan   .  History of melanoma in situ of left lower back previously seen by Dr. Livingston but he is looking now for new Dermatologist since Dr. Livingston has left the area.  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  He has a leg prosthesis.  His parents are deceased.  He stayed with them for a long time and took care of them.  He now resides alone.  He works out at [employer] but not as much as he used to.  He has a history of impaired glucose intolerance which is diet-controlled.  Remote history of fractured clavicle.  History of MRI of the left shoulder in 2012  showing a probable labral tear.  He had a normal nuclear stress test in 2009.    Seen in ED for nausea/vomiting on 03/08/23 and again on 03/09/23. Admitted to [facility] from 03/11/23 - 03/16/23 for intractable nausea and vomiting. He was smoking a significant amount of cannabis and believes that this contributed to cyclical vomiting.He has hx of cyclical vomiting and has had ED visits from time to time to receive anti-emetic meds and IVFs. He attributes this increased  cannabis smoking to an increase in sedentary lifestyle due to recent surgical treatment for nonhealing BKA wound. On 03/11/23, CT abdomen showed no acute intraabdominal or pelvic pathology, mild fatty infiltration liver. UDS on 03/13/23 showed benzodiazepines, opiates, THC. Potassium was as low as 3.2 on 03/14/23 and improved to 3.5 on 03/15/23. Magnesium  low-normal at 1.7 on 03/11/23 and improved to 1.9 on 03/13/23. BMET checked on 03/22/23 and was normal.   Status post revision transtibial amputation in [place] on 02/22/23. Completed course of doxycycline  and was given oxycodone  for pain. Follow-up with orthopedic surgery and plastic surgery on 04/23/23 and plans to have stiches removed.    Labs pending  01/04/2023 Colonoscopy Hemorrhoids found on Perianal rectal exam, one polyp in the transverse colon, removed with a cold snare. Resected and retrieved. Three polyps in rectum and sigmoid colon. The examined portion of the ilium was normal. The examination was otherwise without abnormality on direct and retroflexion views.    Health Maintenance  Topic Date Due   Zoster Vaccines- Shingrix (1 of 2) Never  done   OPHTHALMOLOGY EXAM  10/20/2023   COVID-19 Vaccine (5 - 2025-26 season) 12/20/2023   Medicare Annual Wellness (AWV)  04/11/2024   HEMOGLOBIN A1C  04/12/2024   Diabetic kidney evaluation - Urine ACR  05/03/2024   Diabetic kidney evaluation - eGFR measurement  08/06/2024   FOOT EXAM  10/18/2024   DTaP/Tdap/Td (3 - Td  or Tdap) 09/23/2029   Colonoscopy  01/03/2033   Pneumococcal Vaccine: 50+ Years  Completed   Influenza Vaccine  Completed   HIV Screening  Completed   Hepatitis B Vaccines 19-59 Average Risk  Aged Out   HPV VACCINES  Aged Out   Meningococcal B Vaccine  Aged Out   Hepatitis C Screening  Discontinued    Review of Systems  Constitutional:  Negative for fever and malaise/fatigue.  HENT:  Negative for congestion.   Eyes:  Negative for blurred vision.  Respiratory:  Negative for cough and shortness of breath.   Cardiovascular:  Negative for chest pain, palpitations and leg swelling.  Gastrointestinal:  Negative for vomiting.  Musculoskeletal:  Negative for back pain.  Skin:  Negative for rash.  Neurological:  Negative for loss of consciousness and headaches.   Objective:  Vitals: body mass index is 24.55 kg/m. Today's Vitals   04/27/24 0958  BP: 100/70  Pulse: 70  SpO2: 98%  Weight: 181 lb (82.1 kg)  Height: 6' (1.829 m)  PainSc: 0-No pain   Physical Exam Vitals and nursing note reviewed. Exam conducted with a chaperone present (Araceli Rivergrove, CMA).  Constitutional:      General: He is awake. He is not in acute distress.    Appearance: Normal appearance. He is not ill-appearing or toxic-appearing.  HENT:     Head: Normocephalic and atraumatic.     Right Ear: Tympanic membrane, ear canal and external ear normal.     Left Ear: Tympanic membrane, ear canal and external ear normal.     Mouth/Throat:     Pharynx: Oropharynx is clear.  Eyes:     Extraocular Movements: Extraocular movements intact.     Pupils: Pupils are equal, round, and reactive to light.  Neck:     Thyroid : No thyroid  mass, thyromegaly or thyroid  tenderness.     Vascular: No carotid bruit.  Cardiovascular:     Rate and Rhythm: Normal rate and regular rhythm. No extrasystoles are present.    Pulses:          Dorsalis pedis pulses are 2+ on the right side and 2+ on the left side.       Posterior tibial  pulses are 2+ on the right side and 2+ on the left side.     Heart sounds: Normal heart sounds. No murmur heard.    No friction rub. No gallop.  Pulmonary:     Effort: Pulmonary effort is normal.     Breath sounds: Normal breath sounds. No decreased breath sounds, wheezing, rhonchi or rales.  Chest:     Chest wall: No mass.  Abdominal:     Palpations: Abdomen is soft. There is no hepatomegaly, splenomegaly or mass.     Tenderness: There is no abdominal tenderness.     Hernia: No hernia is present.  Genitourinary:    Prostate: Normal. Not enlarged, not tender and no nodules present.     Rectum: Normal. Guaiac result negative.  Musculoskeletal:     Cervical back: Normal range of motion.     Right lower leg: No edema.     Left  lower leg: No edema.  Lymphadenopathy:     Cervical: No cervical adenopathy.     Upper Body:     Right upper body: No supraclavicular adenopathy.     Left upper body: No supraclavicular adenopathy.  Skin:    General: Skin is warm and dry.  Neurological:     General: No focal deficit present.     Mental Status: He is alert and oriented to person, place, and time. Mental status is at baseline.     Cranial Nerves: Cranial nerves 2-12 are intact.     Sensory: Sensation is intact.     Motor: Motor function is intact.     Coordination: Coordination is intact.     Gait: Gait is intact.     Deep Tendon Reflexes: Reflexes are normal and symmetric.  Psychiatric:        Attention and Perception: Attention normal.        Mood and Affect: Mood normal.        Speech: Speech normal.        Behavior: Behavior normal. Behavior is cooperative.        Thought Content: Thought content normal.        Cognition and Memory: Cognition and memory normal.        Judgment: Judgment normal.     Current Outpatient Medications  Medication Instructions   ALPRAZolam  (XANAX ) 1 mg, Daily PRN   famotidine  (PEPCID ) 20 mg, Oral, 2 times daily   FLUoxetine  (PROZAC ) 40 mg, Daily    FLUoxetine  (PROZAC ) 20 mg, Every morning   LOREEV XR  2 MG CS24 1 capsule, Daily   metoCLOPramide  (REGLAN ) 10 mg, Oral, Every 8 hours PRN   omeprazole  (PRILOSEC) 40 MG capsule Oral, Daily   ondansetron  (ZOFRAN ) 4 mg, Oral, Every 4 hours PRN   promethazine  (PHENERGAN ) 25 mg, Rectal, Every 6 hours PRN   promethazine  (PHENERGAN ) 25 mg, Oral, Every 6 hours PRN   rosuvastatin  (CRESTOR ) 20 mg, Oral, Daily   Vraylar 3 mg, Daily   Past Medical History:  Diagnosis Date   Angiosarcoma (HCC)    Of the left leg   Atypical mole 07/14/1996   Upper Mid Back (slight)   Atypical nevus 07/14/1996   Mid/Lower Right Back (slight to moderate)   Atypical nevus 12/28/1996   Right Mid Back (slight to moderate)   Atypical nevus 12/28/1996   Mid Lower Back (slight to moderate) (widershave)   Atypical nevus 10/26/2000   Right Abdomen (moderate)   Atypical nevus 07/27/2003   Left Lower Back (moderate) (widershave)   Atypical nevus 07/27/2003   Left Lower Back (mid) (slight) (widershave)   Atypical nevus 07/27/2003   Right Lower Back (inf) (slight) (widershave)   Atypical nevus 07/27/2003   Right Lower Back (sup) (slight) (widershave)   Atypical nevus 07/27/2003   Right Back (slight)   Atypical nevus 07/27/2003   Right Center Back (sup) (slight)   Atypical nevus 07/27/2003   Right Center Back (inf) (mild)   Atypical nevus 07/27/2003   Right Side (mild)   Atypical nevus 07/27/2003   Right Mid Back (slight) (4mm punch widershave)   Atypical nevus 03/10/2005   Left Abdomen (moderate to severe) terril)   Atypical nevus 03/10/2005   Left Upper Back,sup (moderate)   Atypical nevus 03/10/2005   Left Upper Back,medial (moderate) (observe)   Atypical nevus 03/10/2005   Left Upper Back, inf (moderate)   Atypical nevus 04/22/2006   Right Mid Back (marked) (widershave)   Bone cancer (HCC)  has left BK amputation   Chest pain April 2010   Myocardial Infarction ruled out   Dyslipidemia     Essential hypertension    Gastritis April 2010   Acute--Resolved   Hiatal hernia    Marijuana abuse    History of Marijuana use   Melanoma (HCC) 10/02/2019   LEFT LOWER BACK MM INSITU   Nausea alone    Recurrent   Neck problem    Vomiting    Medical/Surgical History Narrative:  Allergic/Intolerant to: Allergies[1]  Past Surgical History:  Procedure Laterality Date   AMPUTATION     Left leg below the knee    APPENDECTOMY     CERVICAL LAMINECTOMY     Family History  Problem Relation Age of Onset   Diabetes Mother    Bladder Cancer Father    Crohn's disease Brother    Family History Narrative: 1 brother with history of Crohn's disease. Mother had osteoporosis, hyperlipidemia, atrial fibrillation, history of MI, mitral regurgitation and stent placement along with diabetes mellitus. She was in her 90s when she passed away. Father was in his late 61s with history of chronic back pain from lumbar stenosis, bladder cancer, diabetes mellitus, coronary stent placement, hyperlipidemia, hypertension, COPD and obstructive sleep apnea. 1 sister age 30 in good health.  Social History   Social History Narrative   Wears glasses    Right handed    Drinks coffee 3 cups per week   Drinks sodas 3-4 times per week.  Never married. Does not smoke. No alcohol consumption. 4-year college degree. He receives disability benefits.  Most Recent Health Risks Assessment:   Most Recent Social Determinants of Health (Including Hx of Tobacco, Alcohol, and Drug Use) SDOH Screenings   Food Insecurity: No Food Insecurity (04/18/2024)  Recent Concern: Food Insecurity - Food Insecurity Present (04/18/2024)  Housing: Low Risk (04/18/2024)  Transportation Needs: No Transportation Needs (04/18/2024)  Utilities: Not At Risk (04/18/2024)  Alcohol Screen: Low Risk (04/18/2024)  Depression (PHQ2-9): High Risk (10/19/2023)  Financial Resource Strain: Low Risk (04/18/2024)  Physical Activity: Sufficiently Active  (04/18/2024)  Social Connections: Socially Isolated (04/18/2024)  Stress: Stress Concern Present (04/18/2024)  Tobacco Use: Low Risk (04/27/2024)  Health Literacy: Adequate Health Literacy (04/18/2024)   Social History[2] Most Recent Functional Status Assessment:    04/18/2024    5:49 PM  In your present state of health, do you have any difficulty performing the following activities:  Hearing? 0   Vision? 0      Manually entered by patient   Most Recent Fall Risk Assessment:    04/18/2024    5:49 PM  Fall Risk   Falls in the past year? 1   Number falls in past yr: 1   Injury with Fall? 0      Manually entered by patient   Most Recent Anxiety/Depression Screenings:    10/19/2023    3:28 PM 06/29/2022   12:09 PM  PHQ 2/9 Scores  PHQ - 2 Score 4 2  PHQ- 9 Score 12  4      Data saved with a previous flowsheet row definition      10/19/2023    3:28 PM 02/13/2017    2:07 PM  GAD 7 : Generalized Anxiety Score  Nervous, Anxious, on Edge 2 2  Control/stop worrying 2 2  Worry too much - different things 2 2  Trouble relaxing 1 2  Restless 1 2  Easily annoyed or irritable 0 2  Afraid - awful might  happen 1 2  Total GAD 7 Score 9 14  Anxiety Difficulty Not difficult at all Somewhat difficult   Most Recent Cognitive Screening:    04/12/2023    3:08 PM  6CIT Screen  What Year? 0 points  What month? 0 points  What time? 0 points  Count back from 20 0 points  Months in reverse 0 points  Repeat phrase 0 points  Total Score 0 points   Most Recent Vision/Hearing Screenings:Vision Screening - Comments:: [employer] Results:  Studies Obtained And Personally Reviewed By Me:   01/04/2023 Colonoscopy Hemorrhoids found on Perianal rectal exam, one polyp in the transverse colon, removed with a cold snare. Resected and retrieved. Three polyps in rectum and sigmoid colon. The examined portion of the ilium was normal. The examination was otherwise without abnormality on direct and  retroflexion views.    Labs:  CBC w/ Differential Lab Results  Component Value Date   WBC 14.6 (H) 08/07/2023   RBC 5.10 08/07/2023   HGB 14.7 08/07/2023   HCT 43.2 08/07/2023   PLT 315 08/07/2023   MCV 84.7 08/07/2023   MCH 28.8 08/07/2023   MCHC 34.0 08/07/2023   RDW 14.0 08/07/2023   MPV 11.2 05/04/2023   LYMPHSABS 0.6 (L) 08/07/2023   MONOABS 0.8 08/07/2023   BASOSABS 0.0 08/07/2023    Comprehensive Metabolic Panel Lab Results  Component Value Date   NA 140 08/07/2023   K 3.8 08/07/2023   CL 107 08/07/2023   CO2 18 (L) 08/07/2023   GLUCOSE 173 (H) 08/07/2023   BUN 25 (H) 08/07/2023   CREATININE 1.28 (H) 08/07/2023   CALCIUM  10.2 08/07/2023   PROT 7.4 10/12/2023   ALBUMIN 4.9 08/07/2023   AST 17 10/12/2023   ALT 16 10/12/2023   ALKPHOS 97 08/07/2023   BILITOT 0.7 10/12/2023   GFR 66.12 02/19/2014   EGFR 83 05/04/2023   GFRNONAA >60 08/07/2023   Lipid Panel  Lab Results  Component Value Date   CHOL 233 (H) 10/12/2023   HDL 66 10/12/2023   LDLCALC 147 (H) 10/12/2023   TRIG 95 10/12/2023   A1c Lab Results  Component Value Date   HGBA1C 6.1 (H) 10/12/2023    TSH Lab Results  Component Value Date   TSH 2.76 10/13/2022    Assessment & Plan:   Orders Placed This Encounter  Procedures   AMB Referral VBCI Care Management    Referral Priority:   Routine    Referral Type:   Consultation    Referral Reason:   Care Coordination    Number of Visits Requested:   1   POCT URINALYSIS DIP (CLINITEK)    Anxiety and depression: treated with alprazolam  1 mg daily as needed, fluoxetine  40 mg daily, Vraylar 3 mg.  Seen at [facility].   GE Reflux: treated with omeprazole  40 mg daily.    Hyperlipidemia: treated with rosuvastatin  20 mg daily.   cyclical vomiting: treated with phenergan  25 mg.      Labs pending  01/04/2023 Colonoscopy Hemorrhoids found on Perianal rectal exam, one polyp in the transverse colon, removed with a cold  snare. Resected and retrieved. Three polyps in rectum and sigmoid colon. The examined portion of the ilium was normal. The examination was otherwise without abnormality on direct and retroflexion views.     Annual Wellness Visit done today including the all of the following: Reviewed patient's Family Medical History Reviewed patient's SDOH and reviewed tobacco, alcohol, and drug use.  Reviewed and  updated list of patient's medical providers Assessment of cognitive impairment was done Assessed patient's functional ability Established a written schedule for health screening services Health Risk Assessent Completed and Reviewed  Discussed health benefits of physical activity, and encouraged him to engage in regular exercise appropriate for his age and condition.    I,Makayla C Reid,acting as a scribe for Benjamin JINNY Hailstone, MD.,have documented all relevant documentation on the behalf of Benjamin JINNY Hailstone, MD,as directed by  Benjamin JINNY Hailstone, MD while in the presence of Benjamin JINNY Hailstone, MD.  I, Benjamin JINNY Hailstone, MD, have reviewed all documentation for and agree with the above Annual Wellness Visit documentation.  Benjamin JINNY Hailstone, MD Internal Medicine 04/27/2024     [1] No Known Allergies [2]  Social History Tobacco Use   Smoking status: Never   Smokeless tobacco: Never  Vaping Use   Vaping status: Never Used  Substance Use Topics   Alcohol use: No    Comment: rare   Drug use: Yes    Types: Marijuana    Comment: 3 times a week   "

## 2024-04-27 ENCOUNTER — Ambulatory Visit: Admitting: Internal Medicine

## 2024-04-27 ENCOUNTER — Encounter: Payer: Self-pay | Admitting: Internal Medicine

## 2024-04-27 VITALS — BP 100/70 | HR 70 | Ht 72.0 in | Wt 181.0 lb

## 2024-04-27 DIAGNOSIS — E7800 Pure hypercholesterolemia, unspecified: Secondary | ICD-10-CM

## 2024-04-27 DIAGNOSIS — E785 Hyperlipidemia, unspecified: Secondary | ICD-10-CM

## 2024-04-27 DIAGNOSIS — Z85820 Personal history of malignant melanoma of skin: Secondary | ICD-10-CM

## 2024-04-27 DIAGNOSIS — Z0001 Encounter for general adult medical examination with abnormal findings: Secondary | ICD-10-CM

## 2024-04-27 DIAGNOSIS — F419 Anxiety disorder, unspecified: Secondary | ICD-10-CM

## 2024-04-27 DIAGNOSIS — R1115 Cyclical vomiting syndrome unrelated to migraine: Secondary | ICD-10-CM | POA: Diagnosis not present

## 2024-04-27 DIAGNOSIS — Z5941 Food insecurity: Secondary | ICD-10-CM

## 2024-04-27 DIAGNOSIS — K219 Gastro-esophageal reflux disease without esophagitis: Secondary | ICD-10-CM

## 2024-04-27 DIAGNOSIS — F330 Major depressive disorder, recurrent, mild: Secondary | ICD-10-CM

## 2024-04-27 DIAGNOSIS — F418 Other specified anxiety disorders: Secondary | ICD-10-CM

## 2024-04-27 DIAGNOSIS — Z0000 Encounter for general adult medical examination without abnormal findings: Secondary | ICD-10-CM

## 2024-04-27 DIAGNOSIS — Z89512 Acquired absence of left leg below knee: Secondary | ICD-10-CM

## 2024-04-27 DIAGNOSIS — C439 Malignant melanoma of skin, unspecified: Secondary | ICD-10-CM

## 2024-04-27 LAB — POCT URINALYSIS DIP (CLINITEK)
Bilirubin, UA: NEGATIVE
Blood, UA: NEGATIVE
Glucose, UA: NEGATIVE mg/dL
Ketones, POC UA: NEGATIVE mg/dL
Leukocytes, UA: NEGATIVE
Nitrite, UA: NEGATIVE
POC PROTEIN,UA: NEGATIVE
Spec Grav, UA: 1.02
Urobilinogen, UA: 0.2 U/dL
pH, UA: 6

## 2024-04-27 NOTE — Progress Notes (Signed)
 "  Chief Complaint  Patient presents with   Annual Exam   Medicare Wellness   Depression     Subjective:   Benjamin Valdez is a 64 y.o. male who presents for a Medicare Annual Wellness Visit.  Visit info / Clinical Intake: Medicare Wellness Visit Type:: Subsequent Annual Wellness Visit Persons participating in visit and providing information:: patient Medicare Wellness Visit Mode:: In-person (required for WTM) Interpreter Needed?: No Pre-visit prep was completed: yes AWV questionnaire completed by patient prior to visit?: yes Date:: 04/20/24 Living arrangements:: (!) lives alone Patient's Overall Health Status Rating: very good Typical amount of pain: none Does pain affect daily life?: (!) yes Are you currently prescribed opioids?: no  Dietary Habits and Nutritional Risks How many meals a day?: 2 Eats fruit and vegetables daily?: (!) no Most meals are obtained by: preparing own meals In the last 2 weeks, have you had any of the following?: none Diabetic:: (!) yes Any non-healing wounds?: no How often do you check your BS?: as needed Would you like to be referred to a Nutritionist or for Diabetic Management? : no  Functional Status Activities of Daily Living (to include ambulation/medication): Independent Ambulation: Independent Medication Administration: Independent Home Management (perform basic housework or laundry): Independent Manage your own finances?: yes Primary transportation is: driving Concerns about vision?: no *vision screening is required for WTM* Concerns about hearing?: no  Fall Screening Falls in the past year?: 1 Number of falls in past year: 1 Was there an injury with Fall?: 0 Fall Risk Category Calculator: 2 Patient Fall Risk Level: Moderate Fall Risk  Fall Risk Patient at Risk for Falls Due to: No Fall Risks Fall risk Follow up: Falls prevention discussed; Education provided; Falls evaluation completed  Home and Transportation Safety: All  rugs have non-skid backing?: (!) no All stairs or steps have railings?: yes Grab bars in the bathtub or shower?: yes Have non-skid surface in bathtub or shower?: yes Good home lighting?: yes Regular seat belt use?: yes Hospital stays in the last year:: no  Cognitive Assessment Difficulty concentrating, remembering, or making decisions? : yes Will 6CIT or Mini Cog be Completed: no 6CIT or Mini Cog Declined: patient alert, oriented, able to answer questions appropriately and recall recent events  Advance Directives (For Healthcare) Does Patient Have a Medical Advance Directive?: No Would patient like information on creating a medical advance directive?: No - Patient declined  Reviewed/Updated  Reviewed/Updated: Reviewed All (Medical, Surgical, Family, Medications, Allergies, Care Teams, Patient Goals)    Allergies (verified) Patient has no known allergies.   Current Medications (verified) Outpatient Encounter Medications as of 04/27/2024  Medication Sig   ALPRAZolam  (XANAX ) 1 MG tablet Take 1 mg by mouth daily as needed for anxiety.   famotidine  (PEPCID ) 20 MG tablet Take 1 tablet (20 mg total) by mouth 2 (two) times daily.   FLUoxetine  (PROZAC ) 20 MG capsule Take 20 mg by mouth every morning. TDD 60mg    FLUoxetine  (PROZAC ) 40 MG capsule Take 40 mg by mouth daily. TDD 60mg    LOREEV XR  2 MG CS24 Take 1 capsule by mouth daily.   metoCLOPramide  (REGLAN ) 10 MG tablet Take 1 tablet (10 mg total) by mouth every 8 (eight) hours as needed for up to 10 doses for refractory nausea / vomiting.   omeprazole  (PRILOSEC) 40 MG capsule TAKE 1 CAPSULE BY MOUTH EVERY DAY   ondansetron  (ZOFRAN ) 4 MG tablet Take 1 tablet (4 mg total) by mouth every 4 (four) hours as needed for nausea  or vomiting.   promethazine  (PHENERGAN ) 25 MG suppository Place 1 suppository (25 mg total) rectally every 6 (six) hours as needed for nausea or vomiting.   promethazine  (PHENERGAN ) 25 MG tablet Take 1 tablet (25 mg total)  by mouth every 6 (six) hours as needed for nausea or vomiting.   rosuvastatin  (CRESTOR ) 20 MG tablet TAKE 1 TABLET BY MOUTH EVERY DAY   VRAYLAR 3 MG capsule Take 3 mg by mouth daily.   [DISCONTINUED] DAYVIGO 10 MG TABS Take 1 tablet by mouth daily. (Patient not taking: Reported on 04/27/2024)   [DISCONTINUED] lamoTRIgine (LAMICTAL) 150 MG tablet Take 150 mg by mouth daily. (Patient not taking: Reported on 04/27/2024)   [DISCONTINUED] QUEtiapine  (SEROQUEL ) 50 MG tablet Take 150 mg by mouth at bedtime. (Patient not taking: Reported on 04/27/2024)   [DISCONTINUED] REXULTI  1 MG TABS tablet Take 1 mg by mouth every morning. (Patient not taking: Reported on 04/27/2024)   Facility-Administered Encounter Medications as of 04/27/2024  Medication   hydrogen peroxide  3 % external solution   mupirocin  cream (BACTROBAN ) 2 %    History: Past Medical History:  Diagnosis Date   Angiosarcoma (HCC)    Of the left leg   Atypical mole 07/14/1996   Upper Mid Back (slight)   Atypical nevus 07/14/1996   Mid/Lower Right Back (slight to moderate)   Atypical nevus 12/28/1996   Right Mid Back (slight to moderate)   Atypical nevus 12/28/1996   Mid Lower Back (slight to moderate) (widershave)   Atypical nevus 10/26/2000   Right Abdomen (moderate)   Atypical nevus 07/27/2003   Left Lower Back (moderate) (widershave)   Atypical nevus 07/27/2003   Left Lower Back (mid) (slight) (widershave)   Atypical nevus 07/27/2003   Right Lower Back (inf) (slight) (widershave)   Atypical nevus 07/27/2003   Right Lower Back (sup) (slight) (widershave)   Atypical nevus 07/27/2003   Right Back (slight)   Atypical nevus 07/27/2003   Right Center Back (sup) (slight)   Atypical nevus 07/27/2003   Right Center Back (inf) (mild)   Atypical nevus 07/27/2003   Right Side (mild)   Atypical nevus 07/27/2003   Right Mid Back (slight) (4mm punch widershave)   Atypical nevus 03/10/2005   Left Abdomen (moderate to severe) terril)    Atypical nevus 03/10/2005   Left Upper Back,sup (moderate)   Atypical nevus 03/10/2005   Left Upper Back,medial (moderate) (observe)   Atypical nevus 03/10/2005   Left Upper Back, inf (moderate)   Atypical nevus 04/22/2006   Right Mid Back (marked) (widershave)   Bone cancer (HCC)    has left BK amputation   Chest pain April 2010   Myocardial Infarction ruled out   Dyslipidemia    Essential hypertension    Gastritis April 2010   Acute--Resolved   Hiatal hernia    Marijuana abuse    History of Marijuana use   Melanoma (HCC) 10/02/2019   LEFT LOWER BACK MM INSITU   Nausea alone    Recurrent   Neck problem    Vomiting    Past Surgical History:  Procedure Laterality Date   AMPUTATION     Left leg below the knee    APPENDECTOMY     CERVICAL LAMINECTOMY     Family History  Problem Relation Age of Onset   Diabetes Mother    Bladder Cancer Father    Crohn's disease Brother    Social History   Occupational History   Not on file  Tobacco Use  Smoking status: Never   Smokeless tobacco: Never  Vaping Use   Vaping status: Never Used  Substance and Sexual Activity   Alcohol use: No    Comment: rare   Drug use: Yes    Types: Marijuana    Comment: 3 times a week   Sexual activity: Not on file   Tobacco Counseling Counseling given: Not Answered  SDOH Screenings   Food Insecurity: No Food Insecurity (04/27/2024)  Recent Concern: Food Insecurity - Food Insecurity Present (04/18/2024)  Housing: Low Risk (04/27/2024)  Transportation Needs: No Transportation Needs (04/27/2024)  Utilities: Not At Risk (04/27/2024)  Alcohol Screen: Low Risk (04/27/2024)  Depression (PHQ2-9): Low Risk (04/27/2024)  Financial Resource Strain: Low Risk (04/27/2024)  Physical Activity: Sufficiently Active (04/27/2024)  Social Connections: Socially Isolated (04/27/2024)  Stress: Stress Concern Present (04/27/2024)  Tobacco Use: Low Risk (04/27/2024)  Health Literacy: Adequate Health Literacy (04/27/2024)   See  flowsheets for full screening details  Depression Screen PHQ 2 & 9 Depression Scale- Over the past 2 weeks, how often have you been bothered by any of the following problems? Little interest or pleasure in doing things: 1 Feeling down, depressed, or hopeless (PHQ Adolescent also includes...irritable): 1 PHQ-2 Total Score: 2 Trouble falling or staying asleep, or sleeping too much: 1 Feeling tired or having little energy: 1 Poor appetite or overeating (PHQ Adolescent also includes...weight loss): 0 Feeling bad about yourself - or that you are a failure or have let yourself or your family down: 0 Trouble concentrating on things, such as reading the newspaper or watching television (PHQ Adolescent also includes...like school work): 0 Moving or speaking so slowly that other people could have noticed. Or the opposite - being so fidgety or restless that you have been moving around a lot more than usual: 0 Thoughts that you would be better off dead, or of hurting yourself in some way: 0 PHQ-9 Total Score: 4 If you checked off any problems, how difficult have these problems made it for you to do your work, take care of things at home, or get along with other people?: Somewhat difficult  Depression Treatment Depression Interventions/Treatment : Counseling     Goals Addressed               This Visit's Progress     DIET - REDUCE CALORIE INTAKE (pt-stated)               Objective:    Today's Vitals   04/27/24 0958  BP: 100/70  Pulse: 70  SpO2: 98%  Weight: 181 lb (82.1 kg)  Height: 6' (1.829 m)  PainSc: 0-No pain   Body mass index is 24.55 kg/m.  Hearing/Vision screen Vision Screening - Comments:: [employer] Immunizations and Health Maintenance Health Maintenance  Topic Date Due   Zoster Vaccines- Shingrix (1 of 2) Never done   OPHTHALMOLOGY EXAM  10/20/2023   COVID-19 Vaccine (5 - 2025-26 season) 12/20/2023   HEMOGLOBIN A1C  04/12/2024   Diabetic kidney evaluation -  Urine ACR  05/03/2024   Diabetic kidney evaluation - eGFR measurement  08/06/2024   FOOT EXAM  10/18/2024   Medicare Annual Wellness (AWV)  04/27/2025   DTaP/Tdap/Td (3 - Td or Tdap) 09/23/2029   Colonoscopy  01/03/2033   Pneumococcal Vaccine: 50+ Years  Completed   Influenza Vaccine  Completed   HIV Screening  Completed   Hepatitis B Vaccines 19-59 Average Risk  Aged Out   HPV VACCINES  Aged Out   Meningococcal B Vaccine  Aged Out   Hepatitis C Screening  Discontinued        Assessment/Plan:  This is a routine wellness examination for Jonn.  Patient Care Team: Perri Ronal PARAS, MD as PCP - General (Internal Medicine) Livingston Rigg, MD as Consulting Physician (Dermatology)  I have personally reviewed and noted the following in the patients chart:   Medical and social history Use of alcohol, tobacco or illicit drugs  Current medications and supplements including opioid prescriptions. Functional ability and status Nutritional status Physical activity Advanced directives List of other physicians Hospitalizations, surgeries, and ER visits in previous 12 months Vitals Screenings to include cognitive, depression, and falls Referrals and appointments  Orders Placed This Encounter  Procedures   AMB Referral VBCI Care Management    Referral Priority:   Routine    Referral Type:   Consultation    Referral Reason:   Care Coordination    Number of Visits Requested:   1   AMB Referral VBCI Care Management    Referral Priority:   Routine    Referral Type:   Consultation    Referral Reason:   Care Coordination    Number of Visits Requested:   1   POCT URINALYSIS DIP (CLINITEK)   In addition, I have reviewed and discussed with patient certain preventive protocols, quality metrics, and best practice recommendations. A written personalized care plan for preventive services as well as general preventive health recommendations were provided to patient.   Reiley Keisler,  CMA   04/27/2024   Return in 1 year (on 04/27/2025).  After Visit Summary: (In Person-Printed) AVS printed and given to the patient  Nurse Notes: none   I, Ronal PARAS Perri, MD, have reviewed all documentation for this visit. The documentation on 04/27/2024 for the exam, diagnosis, procedures, and orders are all accurate and complete.  "

## 2024-04-27 NOTE — Patient Instructions (Signed)
 "  Next appointment: Follow up in one year for your annual wellness visit.   Preventive Care 19 Years and Older, Male Preventive care refers to lifestyle choices and visits with your health care provider that can promote health and wellness. What does preventive care include? A yearly physical exam. This is also called an annual well check. Dental exams once or twice a year. Routine eye exams. Ask your health care provider how often you should have your eyes checked. Personal lifestyle choices, including: Daily care of your teeth and gums. Regular physical activity. Eating a healthy diet. Avoiding tobacco and drug use. Limiting alcohol use. Practicing safe sex. Taking low doses of aspirin  every day. Taking vitamin and mineral supplements as recommended by your health care provider. What happens during an annual well check? The services and screenings done by your health care provider during your annual well check will depend on your age, overall health, lifestyle risk factors, and family history of disease. Counseling  Your health care provider may ask you questions about your: Alcohol use. Tobacco use. Drug use. Emotional well-being. Home and relationship well-being. Sexual activity. Eating habits. History of falls. Memory and ability to understand (cognition). Work and work astronomer. Screening  You may have the following tests or measurements: Height, weight, and BMI. Blood pressure. Lipid and cholesterol levels. These may be checked every 5 years, or more frequently if you are over 26 years old. Skin check. Lung cancer screening. You may have this screening every year starting at age 63 if you have a 30-pack-year history of smoking and currently smoke or have quit within the past 15 years. Fecal occult blood test (FOBT) of the stool. You may have this test every year starting at age 63. Flexible sigmoidoscopy or colonoscopy. You may have a sigmoidoscopy every 5 years or  a colonoscopy every 10 years starting at age 70. Prostate cancer screening. Recommendations will vary depending on your family history and other risks. Hepatitis C blood test. Hepatitis B blood test. Sexually transmitted disease (STD) testing. Diabetes screening. This is done by checking your blood sugar (glucose) after you have not eaten for a while (fasting). You may have this done every 1-3 years. Abdominal aortic aneurysm (AAA) screening. You may need this if you are a current or former smoker. Osteoporosis. You may be screened starting at age 21 if you are at high risk. Talk with your health care provider about your test results, treatment options, and if necessary, the need for more tests. Vaccines  Your health care provider may recommend certain vaccines, such as: Influenza vaccine. This is recommended every year. Tetanus, diphtheria, and acellular pertussis (Tdap, Td) vaccine. You may need a Td booster every 10 years. Zoster vaccine. You may need this after age 105. Pneumococcal 13-valent conjugate (PCV13) vaccine. One dose is recommended after age 34. Pneumococcal polysaccharide (PPSV23) vaccine. One dose is recommended after age 80. Talk to your health care provider about which screenings and vaccines you need and how often you need them. This information is not intended to replace advice given to you by your health care provider. Make sure you discuss any questions you have with your health care provider. Document Released: 05/03/2015 Document Revised: 12/25/2015 Document Reviewed: 02/05/2015 Elsevier Interactive Patient Education  2017 [employer].  Fall Prevention in the Home Falls can cause injuries. They can happen to people of all ages. There are many things you can do to make your home safe and to help prevent falls. What can I  do on the outside of my home? Regularly fix the edges of walkways and driveways and fix any cracks. Remove anything that might make you trip as you  walk through a door, such as a raised step or threshold. Trim any bushes or trees on the path to your home. Use bright outdoor lighting. Clear any walking paths of anything that might make someone trip, such as rocks or tools. Regularly check to see if handrails are loose or broken. Make sure that both sides of any steps have handrails. Any raised decks and porches should have guardrails on the edges. Have any leaves, snow, or ice cleared regularly. Use sand or salt on walking paths during winter. Clean up any spills in your garage right away. This includes oil or grease spills. What can I do in the bathroom? Use night lights. Install grab bars by the toilet and in the tub and shower. Do not use towel bars as grab bars. Use non-skid mats or decals in the tub or shower. If you need to sit down in the shower, use a plastic, non-slip stool. Keep the floor dry. Clean up any water that spills on the floor as soon as it happens. Remove soap buildup in the tub or shower regularly. Attach bath mats securely with double-sided non-slip rug tape. Do not have throw rugs and other things on the floor that can make you trip. What can I do in the bedroom? Use night lights. Make sure that you have a light by your bed that is easy to reach. Do not use any sheets or blankets that are too big for your bed. They should not hang down onto the floor. Have a firm chair that has side arms. You can use this for support while you get dressed. Do not have throw rugs and other things on the floor that can make you trip. What can I do in the kitchen? Clean up any spills right away. Avoid walking on wet floors. Keep items that you use a lot in easy-to-reach places. If you need to reach something above you, use a strong step stool that has a grab bar. Keep electrical cords out of the way. Do not use floor polish or wax that makes floors slippery. If you must use wax, use non-skid floor wax. Do not have throw rugs  and other things on the floor that can make you trip. What can I do with my stairs? Do not leave any items on the stairs. Make sure that there are handrails on both sides of the stairs and use them. Fix handrails that are broken or loose. Make sure that handrails are as long as the stairways. Check any carpeting to make sure that it is firmly attached to the stairs. Fix any carpet that is loose or worn. Avoid having throw rugs at the top or bottom of the stairs. If you do have throw rugs, attach them to the floor with carpet tape. Make sure that you have a light switch at the top of the stairs and the bottom of the stairs. If you do not have them, ask someone to add them for you. What else can I do to help prevent falls? Wear shoes that: Do not have high heels. Have rubber bottoms. Are comfortable and fit you well. Are closed at the toe. Do not wear sandals. If you use a stepladder: Make sure that it is fully opened. Do not climb a closed stepladder. Make sure that both sides of the stepladder  are locked into place. Ask someone to hold it for you, if possible. Clearly mark and make sure that you can see: Any grab bars or handrails. First and last steps. Where the edge of each step is. Use tools that help you move around (mobility aids) if they are needed. These include: Canes. Walkers. Scooters. Crutches. Turn on the lights when you go into a dark area. Replace any light bulbs as soon as they burn out. Set up your furniture so you have a clear path. Avoid moving your furniture around. If any of your floors are uneven, fix them. If there are any pets around you, be aware of where they are. Review your medicines with your doctor. Some medicines can make you feel dizzy. This can increase your chance of falling. Ask your doctor what other things that you can do to help prevent falls. This information is not intended to replace advice given to you by your health care provider. Make sure  you discuss any questions you have with your health care provider. Document Released: 01/31/2009 Document Revised: 09/12/2015 Document Reviewed: 05/11/2014 Elsevier Interactive Patient Education  2017 [employer].   Mr. Salm,  Thank you for taking the time for your Medicare Wellness Visit. I appreciate your continued commitment to your health goals. Please review the care plan we discussed, and feel free to reach out if I can assist you further.  Please note that Annual Wellness Visits do not include a physical exam. Some assessments may be limited, especially if the visit was conducted virtually. If needed, we may recommend an in-person follow-up with your provider.  Ongoing Care Seeing your primary care provider every 3 to 6 months helps us  monitor your health and provide consistent, personalized care.   Referrals If a referral was made during today's visit and you haven't received any updates within two weeks, please contact the referred provider directly to check on the status.  Recommended Screenings:  Health Maintenance  Topic Date Due   Zoster (Shingles) Vaccine (1 of 2) Never done   Eye exam for diabetics  10/20/2023   COVID-19 Vaccine (5 - 2025-26 season) 12/20/2023   Medicare Annual Wellness Visit  04/11/2024   Hemoglobin A1C  04/12/2024   Yearly kidney health urinalysis for diabetes  05/03/2024   Yearly kidney function blood test for diabetes  08/06/2024   Complete foot exam   10/18/2024   DTaP/Tdap/Td vaccine (3 - Td or Tdap) 09/23/2029   Colon Cancer Screening  01/03/2033   Pneumococcal Vaccine for age over 65  Completed   Flu Shot  Completed   HIV Screening  Completed   Hepatitis B Vaccine  Aged Out   HPV Vaccine  Aged Out   Meningitis B Vaccine  Aged Out   Hepatitis C Screening  Discontinued       04/27/2024   11:56 AM  Advanced Directives  Does Patient Have a Medical Advance Directive? No  Would patient like information on creating a medical advance  directive? No - Patient declined    Vision: Annual vision screenings are recommended for early detection of glaucoma, cataracts, and diabetic retinopathy. These exams can also reveal signs of chronic conditions such as diabetes and high blood pressure.  Dental: Annual dental screenings help detect early signs of oral cancer, gum disease, and other conditions linked to overall health, including heart disease and diabetes.  Please see the attached documents for additional preventive care recommendations.   "

## 2024-05-01 ENCOUNTER — Other Ambulatory Visit

## 2024-05-01 DIAGNOSIS — Z125 Encounter for screening for malignant neoplasm of prostate: Secondary | ICD-10-CM

## 2024-05-01 DIAGNOSIS — E119 Type 2 diabetes mellitus without complications: Secondary | ICD-10-CM

## 2024-05-01 DIAGNOSIS — Z0000 Encounter for general adult medical examination without abnormal findings: Secondary | ICD-10-CM

## 2024-05-01 DIAGNOSIS — E7800 Pure hypercholesterolemia, unspecified: Secondary | ICD-10-CM

## 2024-05-02 LAB — CBC WITH DIFFERENTIAL/PLATELET
Absolute Lymphocytes: 1210 {cells}/uL (ref 850–3900)
Absolute Monocytes: 364 {cells}/uL (ref 200–950)
Basophils Absolute: 29 {cells}/uL (ref 0–200)
Basophils Relative: 0.8 %
Eosinophils Absolute: 130 {cells}/uL (ref 15–500)
Eosinophils Relative: 3.6 %
HCT: 43.3 % (ref 39.4–51.1)
Hemoglobin: 14.1 g/dL (ref 13.2–17.1)
MCH: 29.6 pg (ref 27.0–33.0)
MCHC: 32.6 g/dL (ref 31.6–35.4)
MCV: 90.8 fL (ref 81.4–101.7)
MPV: 11.3 fL (ref 7.5–12.5)
Monocytes Relative: 10.1 %
Neutro Abs: 1868 {cells}/uL (ref 1500–7800)
Neutrophils Relative %: 51.9 %
Platelets: 210 Thousand/uL (ref 140–400)
RBC: 4.77 Million/uL (ref 4.20–5.80)
RDW: 13.3 % (ref 11.0–15.0)
Total Lymphocyte: 33.6 %
WBC: 3.6 Thousand/uL — ABNORMAL LOW (ref 3.8–10.8)

## 2024-05-02 LAB — MICROALBUMIN / CREATININE URINE RATIO
Creatinine, Urine: 143 mg/dL (ref 20–320)
Microalb Creat Ratio: 3 mg/g{creat}
Microalb, Ur: 0.4 mg/dL

## 2024-05-02 LAB — LIPID PANEL
Cholesterol: 167 mg/dL
HDL: 55 mg/dL
LDL Cholesterol (Calc): 92 mg/dL
Non-HDL Cholesterol (Calc): 112 mg/dL
Total CHOL/HDL Ratio: 3 (calc)
Triglycerides: 102 mg/dL

## 2024-05-02 LAB — COMPREHENSIVE METABOLIC PANEL WITH GFR
AG Ratio: 2 (calc) (ref 1.0–2.5)
ALT: 14 U/L (ref 9–46)
AST: 14 U/L (ref 10–35)
Albumin: 4.3 g/dL (ref 3.6–5.1)
Alkaline phosphatase (APISO): 66 U/L (ref 35–144)
BUN: 24 mg/dL (ref 7–25)
CO2: 29 mmol/L (ref 20–32)
Calcium: 9.1 mg/dL (ref 8.6–10.3)
Chloride: 106 mmol/L (ref 98–110)
Creat: 1.04 mg/dL (ref 0.70–1.35)
Globulin: 2.2 g/dL (ref 1.9–3.7)
Glucose, Bld: 102 mg/dL — ABNORMAL HIGH (ref 65–99)
Potassium: 4.5 mmol/L (ref 3.5–5.3)
Sodium: 142 mmol/L (ref 135–146)
Total Bilirubin: 0.5 mg/dL (ref 0.2–1.2)
Total Protein: 6.5 g/dL (ref 6.1–8.1)
eGFR: 81 mL/min/1.73m2

## 2024-05-02 LAB — HEMOGLOBIN A1C
Hgb A1c MFr Bld: 5.8 % — ABNORMAL HIGH
Mean Plasma Glucose: 120 mg/dL
eAG (mmol/L): 6.6 mmol/L

## 2024-05-02 LAB — PSA: PSA: 1.62 ng/mL

## 2024-05-03 ENCOUNTER — Telehealth: Payer: Self-pay

## 2024-05-03 NOTE — Progress Notes (Signed)
 Complex Care Management Note Care Guide Note  05/03/2024 Name: Benjamin Valdez MRN: [id number] DOB: [birth year]   Complex Care Management Outreach Attempts: An unsuccessful telephone outreach was attempted today to offer the patient information about available complex care management services.  Follow Up Plan:  Additional outreach attempts will be made to offer the patient complex care management information and services.   Encounter Outcome:  No Answer  Debbe Fuse [facility], [facility] Guide  Direct Dial: [phone]  Fax [phone]

## 2024-05-04 NOTE — Progress Notes (Signed)
 Complex Care Management Note  Care Guide Note 05/04/2024 Name: Benjamin Valdez MRN: [id number] DOB: [birth year]  Benjamin Valdez is a 64 y.o. year old male who sees Baxley, Ronal PARAS, MD for primary care. I reached out to [employer] by phone today to offer complex care management services.  [name] was given information about Complex Care Management services today including:   The Complex Care Management services include support from the care team which includes your Nurse Care Manager, Clinical Social Worker, or Pharmacist.  The Complex Care Management team is here to help remove barriers to the health concerns and goals most important to you. Complex Care Management services are voluntary, and the patient may decline or stop services at any time by request to their care team member.   Complex Care Management Consent Status: Patient agreed to services and verbal consent obtained.   Follow up plan:  Telephone appointment with complex care management team member scheduled for:  05/12/24 and 05/18/24  Encounter Outcome:  Patient Scheduled  Debbe Fuse [facility], [facility] Guide  Direct Dial: [phone]  Fax [phone]

## 2024-05-12 ENCOUNTER — Other Ambulatory Visit: Payer: Self-pay | Admitting: Licensed Clinical Social Worker

## 2024-05-12 NOTE — Patient Instructions (Signed)
 Visit Information  Thank you for taking time to visit with me today. Please don't hesitate to contact me if I can be of assistance to you before our next scheduled appointment.  Our next appointment is by telephone on 05/26/2024 at 1:00 pm Please call the care guide team at [phone] if you need to cancel or reschedule your appointment.   Following is a copy of your care plan:   Goals Addressed             This Visit's Progress    BSWVBCI Social Work Care Plan        Current SDOH Barriers:  Want to get the food card from his insurance  Interventions: Patient interviewed and appropriate screenings performed Referred patient to community resources  Advised patient to to call his insurance and inquire           Please call the Suicide and Crisis Lifeline: 988 go to [facility] Urgent [facility] [address] [place] [phone]) call 911 if you are experiencing a Mental Health or Behavioral Health Crisis or need someone to talk to.  Patient verbalized understanding of Care plan and visit instructions communicated this visit  Benjamin Valdez Benjamin HEDWIG, PhD [facility], [facility] Social Worker Direct Dial: [phone]  Fax: [phone]

## 2024-05-12 NOTE — Patient Outreach (Signed)
 Social Drivers of Health  Community Resource and Care Coordination Visit Note   05/12/2024  Name: Michel Hendon MRN: [id number] DOB:10/05/1960  Situation: Referral received for SDoH needs assessment and assistance related to Food card  from insurance. I obtained verbal consent from Patient.  Visit completed with Patient on the phone.   Background:   SDOH Interventions Today    Flowsheet Row Most Recent Value  SDOH Interventions   Food Insecurity Interventions Intervention Not Indicated  Housing Interventions Intervention Not Indicated  Transportation Interventions Intervention Not Indicated  Utilities Interventions Intervention Not Indicated     Assessment:   Goals Addressed             This Visit's Progress    BSWVBCI Social Work Care Plan        Current SDOH Barriers:  Want to get the food card from his insurance  Interventions: Patient interviewed and appropriate screenings performed Referred patient to community resources  Advised patient to to call his insurance and inquire           Recommendation:   attend all scheduled provider appointments ask for help if you don't understand your health insurance benefits Call insurance for food card   Follow Up Plan:   Telephone follow up appointment date/time:  05/26/2024 at 2:00 pm  Tobias CHARM Maranda HEDWIG, PhD [facility], [facility] Social Worker Direct Dial: [phone]  Fax: [phone]

## 2024-05-18 ENCOUNTER — Other Ambulatory Visit: Payer: Self-pay

## 2024-05-18 DIAGNOSIS — E119 Type 2 diabetes mellitus without complications: Secondary | ICD-10-CM

## 2024-05-18 DIAGNOSIS — F411 Generalized anxiety disorder: Secondary | ICD-10-CM

## 2024-05-18 DIAGNOSIS — Z85830 Personal history of malignant neoplasm of bone: Secondary | ICD-10-CM

## 2024-05-18 DIAGNOSIS — Z89512 Acquired absence of left leg below knee: Secondary | ICD-10-CM

## 2024-05-18 NOTE — Patient Outreach (Signed)
 Complex Care Management   Visit Note  05/18/2024  Name:  Benjamin Valdez MRN: [id number] DOB: [birth year]  Situation: Referral received for Complex Care Management related to GAD (generalized anxiety disorder), Hx of Sarcoma, Hx of left BKA, Controlled type 2 diabetes mellitus without complication, without long-term current use of insulin. I obtained verbal consent from Patient.  Visit completed with Patient on the phone.  Background:   Past Medical History:  Diagnosis Date   Angiosarcoma (HCC)    Of the left leg   Atypical mole 07/14/1996   Upper Mid Back (slight)   Atypical nevus 07/14/1996   Mid/Lower Right Back (slight to moderate)   Atypical nevus 12/28/1996   Right Mid Back (slight to moderate)   Atypical nevus 12/28/1996   Mid Lower Back (slight to moderate) (widershave)   Atypical nevus 10/26/2000   Right Abdomen (moderate)   Atypical nevus 07/27/2003   Left Lower Back (moderate) (widershave)   Atypical nevus 07/27/2003   Left Lower Back (mid) (slight) (widershave)   Atypical nevus 07/27/2003   Right Lower Back (inf) (slight) (widershave)   Atypical nevus 07/27/2003   Right Lower Back (sup) (slight) (widershave)   Atypical nevus 07/27/2003   Right Back (slight)   Atypical nevus 07/27/2003   Right Center Back (sup) (slight)   Atypical nevus 07/27/2003   Right Center Back (inf) (mild)   Atypical nevus 07/27/2003   Right Side (mild)   Atypical nevus 07/27/2003   Right Mid Back (slight) (4mm punch widershave)   Atypical nevus 03/10/2005   Left Abdomen (moderate to severe) terril)   Atypical nevus 03/10/2005   Left Upper Back,sup (moderate)   Atypical nevus 03/10/2005   Left Upper Back,medial (moderate) (observe)   Atypical nevus 03/10/2005   Left Upper Back, inf (moderate)   Atypical nevus 04/22/2006   Right Mid Back (marked) (widershave)   Bone cancer (HCC)    has left BK amputation   Chest pain April 2010   Myocardial Infarction ruled out    Dyslipidemia    Essential hypertension    Gastritis April 2010   Acute--Resolved   Hiatal hernia    Marijuana abuse    History of Marijuana use   Melanoma (HCC) 10/02/2019   LEFT LOWER BACK MM INSITU   Nausea alone    Recurrent   Neck problem    Vomiting     Assessment: Patient Reported Symptoms:  Cognitive Cognitive Status: Alert and oriented to person, place, and time, Normal speech and language skills Cognitive/Intellectual Conditions Management [RPT]: None reported or documented in medical history or problem list   Health Maintenance Behaviors: Annual physical exam, Healthy diet, Sleep adequate, Exercise, Immunizations Healing Pattern: Fast Health Facilitated by: Rest, Healthy diet, Stress management  Neurological Neurological Review of Symptoms: Numbness Neurological Management Strategies: Routine screening Neurological Self-Management Outcome: 4 (good) Neurological Comment: intermittent numbness and tingling to both hands while sleeping, PCP made of aware by patient  HEENT HEENT Symptoms Reported: No symptoms reported      Cardiovascular Cardiovascular Symptoms Reported: No symptoms reported Does patient have uncontrolled Hypertension?: No    Respiratory Respiratory Symptoms Reported: No symptoms reported    Endocrine Endocrine Symptoms Reported: No symptoms reported Is patient diabetic?: Yes Is patient checking blood sugars at home?: No Endocrine Self-Management Outcome: 4 (good) Endocrine Comment: discussed A1C is currently at 5.8  Gastrointestinal Gastrointestinal Symptoms Reported: No symptoms reported      Genitourinary Genitourinary Symptoms Reported: No symptoms reported    Integumentary Integumentary Symptoms Reported: Skin  changes Additional Integumentary Details: small area of concern, small lesion on chest; plans to see Dermatology Skin Management Strategies: Routine screening Skin Self-Management Outcome: 3 (uncertain)  Musculoskeletal  Musculoskelatal Symptoms Reviewed: Limited mobility, Difficulty walking, Unsteady gait Additional Musculoskeletal Details: patient is using crutches for ambulation and has a prothesis on left lower leg Musculoskeletal Management Strategies: Adequate rest, Routine screening, Medical device Musculoskeletal Self-Management Outcome: 4 (good) Falls in the past year?: Yes Number of falls in past year: 1 or less Was there an injury with Fall?: No Fall Risk Category Calculator: 1 Patient Fall Risk Level: Low Fall Risk Patient at Risk for Falls Due to: Impaired mobility, Impaired balance/gait Fall risk Follow up: Falls evaluation completed, Education provided, Falls prevention discussed  Psychosocial Psychosocial Symptoms Reported: Alteration in sleep habits, Anxiety - if selected complete GAD, Avoiding people, places, activities Behavioral Management Strategies: Counseling, Medication therapy Behavioral Health Self-Management Outcome: 3 (uncertain) Behavioral Health Comment: f/u with [facility]   Quality of Family Relationships: involved, supportive, helpful Do you feel physically threatened by others?: No    05/18/2024    PHQ2-9 Depression Screening   [place] interest or pleasure in doing things Several days  Feeling down, depressed, or hopeless Several days  PHQ-2 - Total Score 2  Trouble falling or staying asleep, or sleeping too much Nearly every day  Feeling tired or having Linna Thebeau energy Several days  Poor appetite or overeating  Not at all  Feeling bad about yourself - or that you are a failure or have let yourself or your family down Several days  Trouble concentrating on things, such as reading the newspaper or watching television Several days  Moving or speaking so slowly that other people could have noticed.  Or the opposite - being so fidgety or restless that you have been moving around a lot more than usual Not at all  Thoughts that you would be better off dead, or  hurting yourself in some way Not at all  PHQ2-9 Total Score 8  If you checked off any problems, how difficult have these problems made it for you to do your work, take care of things at home, or get along with other people Somewhat difficult  Depression Interventions/Treatment Medication, Counseling, Currently on Treatment    There were no vitals filed for this visit. Pain Scale: Not given for pain  Medications Reviewed Today     Reviewed by Morgan Clayborne CROME, RN (Registered Nurse) on 05/18/24 at 1052  Med List Status: <None>   Medication Order Taking? Sig Documenting Provider Last Dose Status Informant  ALPRAZolam  (XANAX ) 1 MG tablet [id number] Yes Take 1 mg by mouth daily as needed for anxiety. [provider]  Active   famotidine  (PEPCID ) 20 MG tablet [id number]  Take 1 tablet (20 mg total) by mouth 2 (two) times daily. Cottie Donnice PARAS, MD  Expired 04/27/24 2359   FLUoxetine  (PROZAC ) 20 MG capsule [id number] Yes Take 20 mg by mouth every morning. TDD 60mg  [provider]  Active   FLUoxetine  (PROZAC ) 40 MG capsule [id number] Yes Take 40 mg by mouth daily. TDD 60mg  [provider]  Active   hydrogen peroxide  3 % external solution [id number]   Perri Ronal PARAS, MD  Active   LOREEV XR  2 MG CS24 [id number]  Take 1 capsule by mouth daily. [provider]  Consider Medication Status and Discontinue (Change in therapy)   metoCLOPramide  (REGLAN ) 10 MG tablet [id number]  Take 1 tablet (10 mg total) by mouth  every 8 (eight) hours as needed for up to 10 doses for refractory nausea / vomiting.  Patient not taking: Reported on 05/18/2024   Cottie Donnice PARAS, MD  Active   mupirocin  cream (BACTROBAN ) 2 % [id number]   Perri Ronal PARAS, MD  Active   omeprazole  (PRILOSEC) 40 MG capsule [id number] Yes TAKE 1 CAPSULE BY MOUTH EVERY DAY Baxley, Ronal PARAS, MD  Active   ondansetron  (ZOFRAN ) 4 MG tablet [id number]  Take 1 tablet (4 mg total) by mouth every 4 (four) hours as needed for nausea or  vomiting.  Patient not taking: Reported on 05/18/2024   Davia Nydia POUR, MD  Active   promethazine  (PHENERGAN ) 25 MG suppository [id number]  Place 1 suppository (25 mg total) rectally every 6 (six) hours as needed for nausea or vomiting.  Patient not taking: Reported on 05/18/2024   Webb, Padonda B, FNP  Active   promethazine  (PHENERGAN ) 25 MG tablet [id number]  Take 1 tablet (25 mg total) by mouth every 6 (six) hours as needed for nausea or vomiting.  Patient not taking: Reported on 05/18/2024   Perri Ronal PARAS, MD  Active   rosuvastatin  (CRESTOR ) 20 MG tablet [id number] Yes TAKE 1 TABLET BY MOUTH EVERY DAY Perri Ronal PARAS, MD  Active   VRAYLAR 3 MG capsule [id number] Yes Take 3 mg by mouth daily. [provider]  Active   zolpidem  (AMBIEN  CR) 12.5 MG CR tablet [id number] Yes Take 12.5 mg by mouth at bedtime. [provider]  Active             Recommendation:   Specialty provider follow-up  Contact your Dermatologist as discussed to schedule a follow up appointment  [facility]  [address], [place] [address]  [phone]  Cristino Comer FERNS, MD  [address]  [place], [place] [address]  [phone] (Work)   Follow Up Plan:   Telephone follow up appointment date/time   05/26/2024 Status: Sch   Time: 2:00 PM Length: 30  Visit Type: PATIENT OUTREACH 30 [3016] Copay: $0.00  Provider: Maranda Lister D Department: [facility]    06/15/2024 Status: Sch   Time: 9:00 AM Length: 60  Visit Type: VBCI TELEPHONE CALL 30 [2502] Copay: $0.00  Provider: Ezzard Rolin BIRCH, LCSW Department: [facility]    06/16/2024 Status: Sch   Time: 9:30 AM Length: 30  Visit Type: VBCI TELEPHONE CALL 30 [2502] Copay: $0.00  Provider: Morgan Clayborne CROME, RN Department: [facility]   Clayborne Morgan RN BSN CCM [place]  [facility], [facility] Nurse Care Coordinator  Direct Dial:  [phone] Website: [email or web address]

## 2024-05-26 ENCOUNTER — Other Ambulatory Visit: Payer: Self-pay | Admitting: Licensed Clinical Social Worker

## 2024-05-26 NOTE — Patient Outreach (Signed)
 Social Drivers of Health  Community Resource and Care Coordination Visit Note   05/26/2024  Name: Benjamin Valdez MRN: [id number] DOB:1961/04/19  Situation: Referral received for SDoH needs assessment and assistance related to [employer] . I obtained verbal consent from Patient.  Visit completed with Patient on the phone.   Background:   SDOH Interventions Today    Flowsheet Row Most Recent Value  SDOH Interventions   Food Insecurity Interventions Intervention Not Indicated  Housing Interventions Intervention Not Indicated  Transportation Interventions Intervention Not Indicated  Utilities Interventions Intervention Not Indicated     Assessment:   Goals Addressed             This Visit's Progress    BSWVBCI Social Work Care Plan        Current SDOH Barriers:  Want to get the food card from his insurance, Patient stated that he received his Flex card today and he was very happy and excited because he can use his Flex card at a lot of stores that he is used to shopping at such as [employer], [employer] etc.  Patient stated that he is having issues with bathroom where he is renting the basement at a friends house and is not sure if anything can be done about it, patient stated that he will be looking into some apartments that are near him. He plans on checking them out to see the floor plans and the price.  Interventions: Patient interviewed and appropriate screenings performed Referred patient to community resources  Advised patient to to call his insurance and inquire  Sw will mail and email the patient some resources on affordable housing.           Recommendation:   attend all scheduled provider appointments follow up with resources the SW will mail and the apartment that the patient is interested in looking into regarding housing needs SW reminded patient about upcoming appointment with [facility] and LCSW  Follow Up Plan:   Telephone follow up  appointment date/time:  06/09/2024 at 2:00 pm  Tobias CHARM Maranda HEDWIG, PhD [facility], [facility] Social Worker Direct Dial: [phone]  Fax: [phone]

## 2024-05-26 NOTE — Patient Instructions (Signed)
 Visit Information  Thank you for taking time to visit with me today. Please don't hesitate to contact me if I can be of assistance to you before our next scheduled appointment.  Your next care management appointment is by telephone on 06/09/2024 at 2:00 pm   Please call the care guide team at [phone] if you need to cancel, schedule, or reschedule an appointment.   Please call the Suicide and Crisis Lifeline: 988 go to [facility] Urgent [facility] [address] [place] [phone]) call 911 if you are experiencing a Mental Health or Behavioral Health Crisis or need someone to talk to.  Tobias CHARM Maranda HEDWIG, PhD [facility], [facility] Social Worker Direct Dial: [phone]  Fax: [phone]

## 2024-06-09 ENCOUNTER — Other Ambulatory Visit: Admitting: Licensed Clinical Social Worker

## 2024-06-15 ENCOUNTER — Telehealth: Admitting: Licensed Clinical Social Worker

## 2024-06-16 ENCOUNTER — Telehealth
